# Patient Record
Sex: Female | Born: 1955
Health system: Southern US, Community
[De-identification: ages and names within clinical notes are randomized; demographics above are authoritative.]

## PROBLEM LIST (undated history)

## (undated) DIAGNOSIS — F32A Depression, unspecified: Secondary | ICD-10-CM

## (undated) DIAGNOSIS — R000 Tachycardia, unspecified: Secondary | ICD-10-CM

## (undated) DIAGNOSIS — F329 Major depressive disorder, single episode, unspecified: Secondary | ICD-10-CM

## (undated) DIAGNOSIS — M87052 Idiopathic aseptic necrosis of left femur: Secondary | ICD-10-CM

## (undated) DIAGNOSIS — R42 Dizziness and giddiness: Secondary | ICD-10-CM

## (undated) DIAGNOSIS — M1990 Unspecified osteoarthritis, unspecified site: Secondary | ICD-10-CM

## (undated) DIAGNOSIS — F419 Anxiety disorder, unspecified: Secondary | ICD-10-CM

## (undated) DIAGNOSIS — I10 Essential (primary) hypertension: Secondary | ICD-10-CM

## (undated) DIAGNOSIS — E785 Hyperlipidemia, unspecified: Secondary | ICD-10-CM

## (undated) DIAGNOSIS — E119 Type 2 diabetes mellitus without complications: Secondary | ICD-10-CM

## (undated) DIAGNOSIS — E669 Obesity, unspecified: Secondary | ICD-10-CM

## (undated) DIAGNOSIS — S32009A Unspecified fracture of unspecified lumbar vertebra, initial encounter for closed fracture: Secondary | ICD-10-CM

## (undated) DIAGNOSIS — H3320 Serous retinal detachment, unspecified eye: Secondary | ICD-10-CM

## (undated) DIAGNOSIS — H5440 Blindness, one eye, unspecified eye: Secondary | ICD-10-CM

## (undated) DIAGNOSIS — R51 Headache: Secondary | ICD-10-CM

## (undated) DIAGNOSIS — G501 Atypical facial pain: Principal | ICD-10-CM

## (undated) DIAGNOSIS — K219 Gastro-esophageal reflux disease without esophagitis: Secondary | ICD-10-CM

## (undated) HISTORY — DX: Major depressive disorder, single episode, unspecified: F32.9

## (undated) HISTORY — DX: Obesity, unspecified: E66.9

## (undated) HISTORY — DX: Depression, unspecified: F32.A

## (undated) HISTORY — PX: THUMB FUSION: SUR636

## (undated) HISTORY — DX: Atypical facial pain: G50.1

## (undated) HISTORY — DX: Type 2 diabetes mellitus without complications: E11.9

## (undated) HISTORY — PX: TONSILLECTOMY: SUR1361

## (undated) HISTORY — DX: Tachycardia, unspecified: R00.0

## (undated) HISTORY — PX: EYE SURGERY: SHX253

## (undated) HISTORY — PX: ABDOMINAL HYSTERECTOMY: SHX81

## (undated) HISTORY — DX: Dizziness and giddiness: R42

---

## 1997-07-14 ENCOUNTER — Encounter: Admission: RE | Admit: 1997-07-14 | Discharge: 1997-07-14 | Payer: Self-pay | Admitting: Internal Medicine

## 1997-08-24 ENCOUNTER — Encounter: Admission: RE | Admit: 1997-08-24 | Discharge: 1997-08-24 | Payer: Self-pay | Admitting: Hematology and Oncology

## 1997-10-29 ENCOUNTER — Encounter: Admission: RE | Admit: 1997-10-29 | Discharge: 1997-10-29 | Payer: Self-pay | Admitting: Internal Medicine

## 1997-11-23 ENCOUNTER — Inpatient Hospital Stay (HOSPITAL_COMMUNITY): Admission: AD | Admit: 1997-11-23 | Discharge: 1997-11-23 | Payer: Self-pay | Admitting: *Deleted

## 1997-11-25 ENCOUNTER — Inpatient Hospital Stay (HOSPITAL_COMMUNITY): Admission: RE | Admit: 1997-11-25 | Discharge: 1997-11-25 | Payer: Self-pay | Admitting: Obstetrics

## 1997-12-09 ENCOUNTER — Encounter: Admission: RE | Admit: 1997-12-09 | Discharge: 1997-12-09 | Payer: Self-pay | Admitting: Obstetrics

## 1997-12-30 ENCOUNTER — Encounter: Admission: RE | Admit: 1997-12-30 | Discharge: 1997-12-30 | Payer: Self-pay | Admitting: Hematology and Oncology

## 1999-01-24 ENCOUNTER — Emergency Department (HOSPITAL_COMMUNITY): Admission: EM | Admit: 1999-01-24 | Discharge: 1999-01-24 | Payer: Self-pay | Admitting: Emergency Medicine

## 1999-06-25 ENCOUNTER — Emergency Department (HOSPITAL_COMMUNITY): Admission: EM | Admit: 1999-06-25 | Discharge: 1999-06-26 | Payer: Self-pay | Admitting: Emergency Medicine

## 1999-06-25 ENCOUNTER — Encounter: Payer: Self-pay | Admitting: Emergency Medicine

## 2000-01-31 ENCOUNTER — Encounter: Payer: Self-pay | Admitting: Family Medicine

## 2000-01-31 ENCOUNTER — Encounter: Admission: RE | Admit: 2000-01-31 | Discharge: 2000-01-31 | Payer: Self-pay | Admitting: Family Medicine

## 2000-02-05 ENCOUNTER — Emergency Department (HOSPITAL_COMMUNITY): Admission: EM | Admit: 2000-02-05 | Discharge: 2000-02-05 | Payer: Self-pay | Admitting: Emergency Medicine

## 2000-02-05 ENCOUNTER — Encounter: Payer: Self-pay | Admitting: Emergency Medicine

## 2000-02-20 HISTORY — PX: OTHER SURGICAL HISTORY: SHX169

## 2000-04-15 ENCOUNTER — Emergency Department (HOSPITAL_COMMUNITY): Admission: EM | Admit: 2000-04-15 | Discharge: 2000-04-16 | Payer: Self-pay | Admitting: *Deleted

## 2000-04-22 ENCOUNTER — Emergency Department (HOSPITAL_COMMUNITY): Admission: EM | Admit: 2000-04-22 | Discharge: 2000-04-22 | Payer: Self-pay | Admitting: Emergency Medicine

## 2000-09-30 ENCOUNTER — Inpatient Hospital Stay (HOSPITAL_COMMUNITY): Admission: EM | Admit: 2000-09-30 | Discharge: 2000-10-01 | Payer: Self-pay | Admitting: Emergency Medicine

## 2000-09-30 ENCOUNTER — Encounter: Payer: Self-pay | Admitting: Emergency Medicine

## 2000-10-01 ENCOUNTER — Encounter: Payer: Self-pay | Admitting: Internal Medicine

## 2000-11-12 ENCOUNTER — Ambulatory Visit (HOSPITAL_COMMUNITY): Admission: RE | Admit: 2000-11-12 | Discharge: 2000-11-12 | Payer: Self-pay | Admitting: Gastroenterology

## 2000-11-12 ENCOUNTER — Encounter: Payer: Self-pay | Admitting: Gastroenterology

## 2000-11-21 ENCOUNTER — Ambulatory Visit (HOSPITAL_COMMUNITY): Admission: RE | Admit: 2000-11-21 | Discharge: 2000-11-21 | Payer: Self-pay | Admitting: Gastroenterology

## 2001-05-27 ENCOUNTER — Encounter: Payer: Self-pay | Admitting: Family Medicine

## 2001-05-27 ENCOUNTER — Encounter: Admission: RE | Admit: 2001-05-27 | Discharge: 2001-05-27 | Payer: Self-pay | Admitting: Family Medicine

## 2003-07-18 ENCOUNTER — Emergency Department (HOSPITAL_COMMUNITY): Admission: EM | Admit: 2003-07-18 | Discharge: 2003-07-18 | Payer: Self-pay | Admitting: Emergency Medicine

## 2004-02-19 ENCOUNTER — Emergency Department (HOSPITAL_COMMUNITY): Admission: EM | Admit: 2004-02-19 | Discharge: 2004-02-19 | Payer: Self-pay | Admitting: Emergency Medicine

## 2004-06-13 ENCOUNTER — Other Ambulatory Visit: Admission: RE | Admit: 2004-06-13 | Discharge: 2004-06-13 | Payer: Self-pay | Admitting: Family Medicine

## 2006-02-13 ENCOUNTER — Emergency Department (HOSPITAL_COMMUNITY): Admission: EM | Admit: 2006-02-13 | Discharge: 2006-02-13 | Payer: Self-pay | Admitting: Emergency Medicine

## 2006-09-23 ENCOUNTER — Ambulatory Visit (HOSPITAL_COMMUNITY): Admission: RE | Admit: 2006-09-23 | Discharge: 2006-09-23 | Payer: Self-pay | Admitting: Ophthalmology

## 2008-04-05 ENCOUNTER — Emergency Department (HOSPITAL_BASED_OUTPATIENT_CLINIC_OR_DEPARTMENT_OTHER): Admission: EM | Admit: 2008-04-05 | Discharge: 2008-04-05 | Payer: Self-pay | Admitting: Emergency Medicine

## 2008-04-05 ENCOUNTER — Ambulatory Visit: Payer: Self-pay | Admitting: Radiology

## 2008-07-27 ENCOUNTER — Other Ambulatory Visit: Admission: RE | Admit: 2008-07-27 | Discharge: 2008-07-27 | Payer: Self-pay | Admitting: Family Medicine

## 2008-08-30 ENCOUNTER — Encounter: Admission: RE | Admit: 2008-08-30 | Discharge: 2008-08-30 | Payer: Self-pay | Admitting: Family Medicine

## 2008-08-31 ENCOUNTER — Encounter: Admission: RE | Admit: 2008-08-31 | Discharge: 2008-08-31 | Payer: Self-pay | Admitting: Family Medicine

## 2009-01-17 ENCOUNTER — Emergency Department (HOSPITAL_COMMUNITY): Admission: EM | Admit: 2009-01-17 | Discharge: 2009-01-17 | Payer: Self-pay | Admitting: Emergency Medicine

## 2010-03-12 ENCOUNTER — Encounter: Payer: Self-pay | Admitting: Family Medicine

## 2010-03-13 ENCOUNTER — Encounter: Payer: Self-pay | Admitting: Family Medicine

## 2010-03-16 ENCOUNTER — Emergency Department (HOSPITAL_COMMUNITY)
Admission: EM | Admit: 2010-03-16 | Discharge: 2010-03-16 | Payer: Self-pay | Source: Home / Self Care | Admitting: Emergency Medicine

## 2010-03-16 LAB — BASIC METABOLIC PANEL
BUN: 11 mg/dL (ref 6–23)
CO2: 24 mEq/L (ref 19–32)
Chloride: 105 mEq/L (ref 96–112)
Creatinine, Ser: 0.96 mg/dL (ref 0.4–1.2)
GFR calc Af Amer: >60 mL/min (ref >60)
GFR calc non Af Amer: >60 mL/min (ref >60)
Glucose, Bld: 118 mg/dL — ABNORMAL HIGH (ref 70–99)

## 2010-03-16 LAB — CBC
HCT: 31.5 % — ABNORMAL LOW (ref 36.0–46.0)
Hemoglobin: 10.1 g/dL — ABNORMAL LOW (ref 12.0–15.0)
MCH: 28 pg (ref 26.0–34.0)
MCV: 87.3 fL (ref 78.0–100.0)
Platelets: 248 10*3/uL (ref 150–400)
RBC: 3.61 MIL/uL — ABNORMAL LOW (ref 3.87–5.11)

## 2010-03-16 LAB — DIFFERENTIAL
Basophils Absolute: 0 10*3/uL (ref 0.0–0.1)
Basophils Relative: 0 % (ref 0–1)
Eosinophils Absolute: 0.1 10*3/uL (ref 0.0–0.7)
Lymphocytes Relative: 25 % (ref 12–46)

## 2010-03-16 LAB — POCT CARDIAC MARKERS
CKMB, poc: 1.6 ng/mL (ref 1.0–8.0)
Myoglobin, poc: 115 ng/mL (ref 12–200)
Troponin i, poc: <0.05 ng/mL (ref 0.00–0.09)

## 2010-05-16 ENCOUNTER — Encounter (HOSPITAL_BASED_OUTPATIENT_CLINIC_OR_DEPARTMENT_OTHER)
Admission: RE | Admit: 2010-05-16 | Discharge: 2010-05-16 | Disposition: A | Discharge status: Home / Self Care | Payer: BC Managed Care – PPO | Source: Ambulatory Visit | Attending: Orthopedic Surgery | Admitting: Orthopedic Surgery

## 2010-05-16 LAB — BASIC METABOLIC PANEL
Calcium: 9.6 mg/dL (ref 8.4–10.5)
GFR calc Af Amer: >60 mL/min (ref >60)

## 2010-05-18 ENCOUNTER — Ambulatory Visit (HOSPITAL_BASED_OUTPATIENT_CLINIC_OR_DEPARTMENT_OTHER)
Admission: RE | Admit: 2010-05-18 | Discharge: 2010-05-18 | Disposition: A | Discharge status: Home / Self Care | Payer: BC Managed Care – PPO | Source: Ambulatory Visit | Attending: Orthopedic Surgery | Admitting: Orthopedic Surgery

## 2010-05-18 DIAGNOSIS — M24119 Other articular cartilage disorders, unspecified shoulder: Secondary | ICD-10-CM | POA: Insufficient documentation

## 2010-05-18 DIAGNOSIS — Z01812 Encounter for preprocedural laboratory examination: Secondary | ICD-10-CM | POA: Insufficient documentation

## 2010-05-18 DIAGNOSIS — I10 Essential (primary) hypertension: Secondary | ICD-10-CM | POA: Insufficient documentation

## 2010-05-18 DIAGNOSIS — M898X9 Other specified disorders of bone, unspecified site: Secondary | ICD-10-CM | POA: Insufficient documentation

## 2010-05-18 DIAGNOSIS — M25819 Other specified joint disorders, unspecified shoulder: Secondary | ICD-10-CM | POA: Insufficient documentation

## 2010-05-18 DIAGNOSIS — E669 Obesity, unspecified: Secondary | ICD-10-CM | POA: Insufficient documentation

## 2010-05-18 LAB — POCT HEMOGLOBIN-HEMACUE: Hemoglobin: 14.2 g/dL (ref 12.0–15.0)

## 2010-06-06 LAB — BASIC METABOLIC PANEL
CO2: 27 mEq/L (ref 19–32)
Chloride: 104 mEq/L (ref 96–112)
GFR calc Af Amer: >60 mL/min (ref >60)
Glucose, Bld: 110 mg/dL — ABNORMAL HIGH (ref 70–99)

## 2010-06-06 LAB — CBC
HCT: 36.3 % (ref 36.0–46.0)
Hemoglobin: 12.2 g/dL (ref 12.0–15.0)
MCV: 87.8 fL (ref 78.0–100.0)
Platelets: 245 10*3/uL (ref 150–400)
RBC: 4.14 MIL/uL (ref 3.87–5.11)
RDW: 12.2 % (ref 11.5–15.5)

## 2010-06-06 LAB — DIFFERENTIAL
Basophils Absolute: 0.1 10*3/uL (ref 0.0–0.1)
Basophils Relative: 1 % (ref 0–1)
Eosinophils Absolute: 0.1 10*3/uL (ref 0.0–0.7)
Eosinophils Relative: 2 % (ref 0–5)
Lymphs Abs: 1.6 10*3/uL (ref 0.7–4.0)

## 2010-06-06 LAB — POCT CARDIAC MARKERS: Troponin i, poc: <0.05 ng/mL (ref 0.00–0.09)

## 2010-06-08 NOTE — Op Note (Signed)
NAME:  Summer [name], Summer [name] NO.:  [id number]  MEDICAL RECORD NO.:  [id number]           PATIENT TYPE:  LOCATION:                                 FACILITY:  PHYSICIAN:  Jones Broom, MD    DATE OF BIRTH:  03-Aug-1955  DATE OF PROCEDURE:  05/18/2010 DATE OF DISCHARGE:                              OPERATIVE REPORT   PREOPERATIVE DIAGNOSIS:  Right shoulder rotator cuff tear and impingement with question of possible superior labral tear.  POSTOPERATIVE DIAGNOSES: 1. Right shoulder supraspinatus tear. 2. Right shoulder impingement with large anterior acromial spur. 3. Type 1 tear of the superior labrum right shoulder.  PROCEDURES PERFORMED: 1. Right shoulder arthroscopic rotator cuff repair. 2. Right shoulder subacromial decompression. 3. Right shoulder debridement of superior labral tear.  ATTENDING SURGEON:  Jones Broom, MD  ASSISTANT:  None.  ANESTHESIA:  GETA with preoperative interscalene block.  COMPLICATIONS:  None.  DRAINS:  None.  SPECIMENS:  None.  ESTIMATED BLOOD LOSS:  Minimal.  INDICATIONS FOR SURGERY:  The patient is a 55 year old female who had a history of right shoulder pain, which began during her rehab for previous right hand injury.  She had an injection, which temporarily relieved her symptoms, but went on to have continued severe right shoulder pain limiting her daily activities and her sleep at night.  MRI revealed a rotator cuff tear.  We talked about risks, benefits, and alternatives to surgical versus nonsurgical management of the rotator cuff tear.  She elected to go forward with the surgery to try and prevent increase in the tear size and to decrease her symptoms and pain. There were some question as to the initial cause of rotator cuff tear. It is my opinion that she had a rotator cuff tear prior to her participation in physical therapy, which became significantly more symptomatic with her therapy.  I think it is  extremely unlikely that her physical therapy actually caused a tear.  Nevertheless, she wished to go forward with surgery understanding risks, benefits, and alternatives of surgery including, but not limited to risk of bleeding, infection, damage to neurovascular structures and stiffness, nonhealing, and incomplete pain relief.  OPERATIVE FINDINGS:  Examination under anesthesia demonstrates full range of motion without any instability.  Diagnostic arthroscopy revealed some extensive fraying of the superior labrum, but no detachment of the biceps root.  This was debrided back to a stable base. The biceps tendon was fairly healthy appearing.  There were some mild longitudinal striations with no full tearing of the tendon, it was not felt that a tenotomy was necessary.  She did not have any loose bodies. The cartilaginous surfaces were intact with no significant arthritis. Posterior rotator cuff was intact.  The superior rotator cuff, she was noted to have a tear of the supraspinatus, which was minimally retracted.  Tear measured approximately 1.5 cm anterior to posterior. It was repaired down to a prepared tuberosity using two 5.5 mm BioComposite corkscrew anchors with simple single row pattern.  No undue tension was noted on the repair.  She was noted to have an extremely large anterior acromial spur.  The coracoacromial ligament  was taken down and the anterior acromial spur was taken down.  At the conclusion, the acromion was turned to a type 1 acromion and completely flat from posterior to anterior with significant increase in the space with rotator cuff.  PROCEDURE:  The patient was identified in the preoperative holding area where I personally marked the operative site after verifying site, side, and procedure with the patient.  She had an interscalene block given by the attending anesthesiologist, which was felt to be successful.  She was taken back to the operating room where  general anesthesia was induced without complication.  She had preoperative antibiotics.  The right upper extremity was prepped and draped in a standard sterile fashion.  Appropriate time-out procedure was carried out by myself, the operative staff, and the anesthesia staff all verifying site, side, and procedure.  A standard posterior portal was established and the arthroscope was introduced into the joint.  The spinal needle was then used to establish anterior portal under direct visualization above the subscapularis.  Diagnostic arthroscopy was then carried out with findings as described above.  There were some mild partial tearing at the upper border of the subscapularis, which was debrided, but not felt to be necessary for formal repair.  She did have extensive fraying and partial tearing of the superior labrum, which was debrided back to a stable base, the biceps grove was intact.  Biceps tendon was pulled into the joint and noted to have some longitudinal striations, but no tearing that would necessitate formal tenotomy or tenodesis.  Joint surfaces were carefully examined and found to be completely intact.  No loosebodies were noted.  Posterior rotator cuff intact.  Supraspinatus was noted to have a tear from the undersurface.  The arthroscope was then introduced into the subacromial space.  There was noted to be significant fraying of the coracoacromial ligament.  Bursectomy was carried out and the underlying rotator cuff was carefully examined and probed.  She was noted to have approximately 1.5-cm tear anterior to posterior.  Tear extended from the biceps tendon posteriorly.  A lateral portal was established with needle localization and the camera was moved to a posterolateral portal for better visualization.  A large cannula was placed laterally.  Tuberosity was prepared to a bleeding surface with bur and the tendon edge was freshened with a shaver to promote healing.  Grasper  was used to reduce the tendon and it was noted that there was no undue tension.  Therefore, one 5.5-mm BioComposite corkscrew anchor was placed percutaneously in a posterior position.  The sutures were passed in a simple suture configuration.  The tendon was held reduced while the knots were tied.  After this first anchor, it was felt that an additional anchor would be appropriate just posterior to the biceps tendon to bring down the remaining portion of the tendon.  It was placed in the same fashion and again a simple suture configuration was used to bring the tendon nicely down to the prepared tuberosity under no undue tension.  The repair was viewed from posterior and lateral portals and felt to be adequate.  The posterior aspect of the rotator cuff was then carefully examined and probed given the finding of possible calcium deposit on her MRI.  I was not able to find the calcium deposit after careful probing with a spinal needle to see if there are any jockey calcifications.  I did not feel that it was worthwhile dissecting through her rotator cuff any further to try  and find this. The coracoacromial ligament taken down and the large anterior acromial spur was then taken off lateral to medial with a standard 4-mm bur.  The resection was then viewed from the posterior portal and felt to be adequate with no significant residual anterior acromial spur.  The undersurface of the acromion was very smooth posterior to anterior. Collene Mares was used in the joint to remove excess bone dust and then the arthroscope was removed and the portals were closed with 3-0 nylon in an interrupted fashion.  Sterile dressings were applied including Xeroform, 4x4s, ABDs, and tape.  The patient was placed in a sling, allowed to awaken from general anesthesia, transferred to the stretcher, and taken to the recovery room in a stable condition.  POSTOPERATIVE PLAN:  Summer [name] will be discharged home today with  her family.  She will follow up in 1 week for suture removal and wound check.  She will remain on her sling until that time.  She will have Percocet for pain control.     Jones Broom, MD     JC/MEDQ  D:  05/18/2010  T:  05/19/2010  Job:  [id number]  Electronically Signed by Jones Broom  on 06/08/2010 03:02:18 PM

## 2010-07-04 NOTE — Op Note (Signed)
NAME:  Summer [name], Summer [name]              ACCOUNT NO.:  [id number]   MEDICAL RECORD NO.:  [id number]          PATIENT TYPE:  AMB   LOCATION:  SDS                          FACILITY:  [facility]   PHYSICIAN:  Lanna Poche, M.D. DATE OF BIRTH:  February 12, 1956   DATE OF PROCEDURE:  09/23/2006  DATE OF DISCHARGE:                               OPERATIVE REPORT   PREOPERATIVE DIAGNOSIS:  Rhegmatogenous retinal detachment right eye.   POSTOPERATIVE DIAGNOSIS:  Rhegmatogenous retinal detachment right eye.   PROCEDURE:  Pars vitrectomy, drainage of subretinal fluid, laser for  retinal break and peripheral laser photocoagulation at 16% C3F8, right  eye.   SURGEON:  Lanna Poche, M.D.   ASSISTANT:  Bryan Lemma. Lundquist, P.A.   ANESTHESIA:  General endotracheal.   ESTIMATED BLOOD LOSS:  Less than 1 mL.   COMPLICATIONS:  None.   OPERATIVE NOTE:  The patient was taken to the operating room and after  induction of general anesthesia, the right eye was prepped in the usual  fashion.  A lid speculum was introduced and the conjunctiva was opened  temporally and superonasally.  Hemostasis was obtained, with cautery and  sclerotomies were fashioned 3 mm to the limbus at 1:30, 10:30 and 7:30.  The supraspinous were plugged and a 4 mm infusion cannula secured at  7:30 with temporary sutures of  7-0 Vicryl.  The tip was visually  inspected and found to be in good position.  A Landers ring was secured  to the globe with 7-0 Vicryl sutures at 3 and 9.  Plugs were removed and  30 degree prismatic lens was applied to the surface of the eye. Using a  25-gauge vitrector, the vitreous was gradually removed.  It was somewhat  thick and this took a considerable amount of time.  The __________ was  used to the trim the vitreous both __________  retina as well over the  area of detached retina.  A __________ atrophic break was seen at  approximately 3 o'clock.  A small anatomic break was made at  approximately 1:30  medially and posterior to the ora serrata __________  vitreous base.  The vitrector was used to aspirate the subretinal fluid  through this hole.  Very thick subretinal and somewhat turbid fluid was  removed without difficulty.  The instruments were removed from the eye  and the holes were plugged.  The Landers lens and ring were removed.  The speculum and ophthalmoscope scleral pressure revealed there to be no  additional retinal breaks or tears.  The indirect laser was used to  __________  on the attached retina and then __________  posterior pole  with egress of a small amount of remaining subretinal fluid through the  open break at approximately 1:30.  Laser photocoagulation was then  applied around the retinal breaks as well as around the small pin point  hole inferior to this.  Good retinal whitening was achieved and  additional retinal photocoagulation was placed for approximately 3-4  rows posterior to the ora serrata at 360 degrees.  Attention was then  directed back to the eye where  the __________  was removed and gas  exchange performed.  The superotemporal sclerotomy was closed with 7-0  Vicryl.  The supranasal sclerotomy was left open and 20 mL of gas  mixture infused with the infusion cannula with egress of the remaining  sclerotomy.  This sclerotomy was then closed and the fusion cannula  removed, previously placed sutures secured.  The pressure was adjusted  to 21 mmHg by additional gas injection with a 30-gauge needle, 3 mL plus  limbus at 10 o'clock.  The conjunctiva was then dropped, reapproximated  with running suture of 6-0 plain gut.  The subconjunctival space was  irrigated with 0.75% Marcaine followed by subconjunctival injection of  100 mg of ceftazidime and , 10 mg of Decadron.  The lid speculum was  then removed and mixed antibiotic ointment was applied to the surface of  the eye. An eye patch and shields were then placed on the patient's eye.  Upon waking from  anesthesia, the patient left the operating room in  stable condition.           ______________________________  Lanna Poche, M.D.     JTH/MEDQ  D:  09/23/2006  T:  09/24/2006  Job:  [id number]

## 2010-07-07 NOTE — H&P (Signed)
[place]. [facility]  Patient:    Summer, [name]                     MRN: [id number] Adm. Date:  [id number] Attending:  Cathren Laine CC:         Gretta Arab. Valentina Lucks, M.D. - [facility]   History and Physical  DATE OF BIRTH:  11/05/1955  PROBLEM LIST: 1. Chest pain, rule out myocardial infarction. 2. Obesity. 3. Status post total abdominal hysterectomy with bilateral    salpingo-oophorectomy in 1982:    a. On hormonal replacement therapy.  CHIEF COMPLAINT:  Pain.  HISTORY OF PRESENT ILLNESS:  Summer [name] is a very pleasant 55 year old female who presents with a three-month history of recurrent chest pain.  The patient describes mostly substernal chest pain when she wakes up in the mornings, and before she starts ambulating.  This pain is described as a dual pain that tends to last between 20-40 minutes.  She also describes occasional chest pain during the day that takes place about one episode every two weeks.  These episodes of chest pain are not related to exertion.  She denies lower extremity swelling.  No shortness of breath.  This morning the patient had another episode of substernal chest pain radiating to the left arm, and bilateral upper extremity numbness while she was sitting up at work.  No syncope.  The patient also describes some symptoms of fluttering.  No dyspnea on exertion, no orthopnea.  No cough, no fever, no chills, no nausea, no vomiting.  Once again, this type of chest symptoms tend to last for about 30 minutes.  No hemoptysis, no hematemesis, no melena, no tarry stools, no bright red blood per rectum.  The patient tends to live a sedentary life.  She does not take long trips either by car or by plane.  The patient denies hypertension, diabetes mellitus, or smoking.  The last lipid profile was done about one year ago.  The patient does not know the results, though her primary care Summer [name] did not recommend any  type of diet change.  In her family, her sister, father, and mother have had acute heart attacks.  See the family medical history section for further details.  Once again, the patients chest symptoms are clearly not related to exercise.  The patient denies any history of heartburn.  PAST MEDICAL HISTORY:  As in the problem list.  ALLERGIES:  No known drug allergies.  CURRENT MEDICATIONS:  Premarin 0.625 mg p.o. q.d.  FAMILY HISTORY:  The patients mother had an acute myocardial infarction at age 50.  Her father also had a myocardial infarction, though she does not know the age when this happened.  Her sister had a myocardial infarction, requiring a quadruple bypass surgery at age 56.  Her mother had hypertension, and her father had diabetes mellitus.  No malignancy or strokes in the family.  SOCIAL HISTORY:  The patient is divorced.  She has one child.  She works as a Pensions consultant.  No smoking, no alcohol use.  REVIEW OF SYSTEMS:  As in the HPI.  No vaginal discharge.  No abdominal symptoms.  No urinary symptoms.  No lower back pain.  PHYSICAL EXAMINATION:  VITAL SIGNS:  Temperature 98.4 degrees, blood pressure 135/73, heart rate 88, respirations 20.  Oxygen saturation 99% on room air.  HEENT:  Normocephalic, atraumatic.  Anicteric sclerae.  Conjunctivae within normal limits.  PERRLA.  EOMI.  Funduscopic  examination negative for papilledema or hemorrhages.  Tympanic membranes within normal limits. Oropharynx clear.  NECK:  Supple.  No jugular venous distention, no bruits, no adenopathy, no thyromegaly.  LUNGS:  Clear to auscultation bilaterally without crackles or wheezes.  Fair air movement bilaterally.  CARDIAC:  A regular rate and rhythm without murmurs, rubs, or gallops.  Normal S1, S2.  ABDOMEN:  Slightly obese, nontender, nondistended.  Bowel sounds were present. No hepatosplenomegaly.  No rebound, no guarding, no masses, no bruits.  GENITOURINARY:  Within normal  limits.  BREASTS:  Within normal limits.  RECTAL:  Not done.  EXTREMITIES:  No cyanosis, clubbing, or edema.  Pulses 2+ bilaterally.  NEUROLOGIC:  Alert and oriented x 3.  Strength 5/5 in all extremities.  Deep tendon reflexes 3/5 in all extremities.  Cranial nerves II-XII intact. Sensory intact.  Plantar reflexes downgoing bilaterally.  LABORATORY DATA:  Chest x-ray:  No active disease.  Slight cardiomegaly.  Electrocardiogram:  Normal sinus rhythm with a heart rate of 78.  Normal axis. There is a Q-wave in lead III.  There is a T-wave inversion in V1, V2, and V3. There is early R-wave progression in the precordial leads V2 through V4. There is not an old electrocardiogram to compare with.  CPK is 193, CPK-MB 1.9, troponin I 0.17.  The CBC and the CMET are within normal limits.  ASSESSMENT/PLAN: 1. Atypical chest pain, rule out myocardial infarction:  The patients    chest pain symptoms are atypical for cardiac source.  She has minimal    cardiac risk factors.  The only cardiac risk factor seems to be at this    point a family medical history, as described above.  The electrocardiogram    although is not normal, shows no evidence of acute ischemia.  The CPK-MB    is completely negative, and the troponin I is abnormal, though the    significance of these results remain completely anuclear.  PLAN:  I discussed this case with Dr. Darci Needle III, cardiology.  The plan is to admit her to a telemetry bed.  Cardiac enzymes will be obtained every eight hours.  A stress Cardiolite will be obtained tomorrow morning. For now will use aspirin.  A beta blocker will not be used at this point, nor nitrates.  2. Obesity.  PLAN:  We discussed exercise and diet, to try to decrease her body weight. DD:  09/30/00 TD:  09/30/00 Job: [id number]

## 2010-12-04 LAB — URINALYSIS, ROUTINE W REFLEX MICROSCOPIC
Bilirubin Urine: NEGATIVE
Nitrite: NEGATIVE
Specific Gravity, Urine: 1.02
Urobilinogen, UA: 0.2
pH: 6

## 2010-12-04 LAB — CBC
MCHC: 33.9
WBC: 6.5

## 2010-12-04 LAB — COMPREHENSIVE METABOLIC PANEL
AST: 22
Albumin: 3.4 — ABNORMAL LOW
CO2: 26
Calcium: 9.4
Chloride: 105
Creatinine, Ser: 1.02
GFR calc Af Amer: >60
GFR calc non Af Amer: 58 — ABNORMAL LOW
Glucose, Bld: 104 — ABNORMAL HIGH
Total Bilirubin: 0.9

## 2010-12-04 LAB — URINE MICROSCOPIC-ADD ON

## 2011-03-23 DIAGNOSIS — H15002 Unspecified scleritis, left eye: Secondary | ICD-10-CM | POA: Insufficient documentation

## 2011-03-23 DIAGNOSIS — H3320 Serous retinal detachment, unspecified eye: Secondary | ICD-10-CM | POA: Insufficient documentation

## 2011-06-14 ENCOUNTER — Ambulatory Visit
Admission: RE | Admit: 2011-06-14 | Discharge: 2011-06-14 | Disposition: A | Discharge status: Home / Self Care | Payer: Self-pay | Source: Ambulatory Visit | Attending: Physician Assistant | Admitting: Physician Assistant

## 2011-06-14 ENCOUNTER — Other Ambulatory Visit: Payer: Self-pay | Admitting: Physician Assistant

## 2011-06-14 DIAGNOSIS — W19XXXA Unspecified fall, initial encounter: Secondary | ICD-10-CM

## 2011-06-14 DIAGNOSIS — M549 Dorsalgia, unspecified: Secondary | ICD-10-CM

## 2011-06-28 DIAGNOSIS — IMO0002 Reserved for concepts with insufficient information to code with codable children: Secondary | ICD-10-CM | POA: Insufficient documentation

## 2011-06-28 DIAGNOSIS — R12 Heartburn: Secondary | ICD-10-CM | POA: Insufficient documentation

## 2011-06-28 DIAGNOSIS — F419 Anxiety disorder, unspecified: Secondary | ICD-10-CM | POA: Insufficient documentation

## 2011-06-29 DIAGNOSIS — Z9889 Other specified postprocedural states: Secondary | ICD-10-CM | POA: Insufficient documentation

## 2012-03-01 ENCOUNTER — Inpatient Hospital Stay (HOSPITAL_COMMUNITY)
Admission: EM | Admit: 2012-03-01 | Discharge: 2012-03-03 | DRG: 103 | Disposition: A | Discharge status: Home / Self Care | Payer: Medicaid Other | Attending: Internal Medicine | Admitting: Internal Medicine

## 2012-03-01 ENCOUNTER — Emergency Department (HOSPITAL_COMMUNITY): Payer: Self-pay

## 2012-03-01 ENCOUNTER — Encounter (HOSPITAL_COMMUNITY): Payer: Self-pay | Admitting: Nurse Practitioner

## 2012-03-01 DIAGNOSIS — E785 Hyperlipidemia, unspecified: Secondary | ICD-10-CM | POA: Diagnosis present

## 2012-03-01 DIAGNOSIS — H3320 Serous retinal detachment, unspecified eye: Secondary | ICD-10-CM | POA: Diagnosis present

## 2012-03-01 DIAGNOSIS — R000 Tachycardia, unspecified: Secondary | ICD-10-CM | POA: Diagnosis present

## 2012-03-01 DIAGNOSIS — Z79899 Other long term (current) drug therapy: Secondary | ICD-10-CM

## 2012-03-01 DIAGNOSIS — Z23 Encounter for immunization: Secondary | ICD-10-CM

## 2012-03-01 DIAGNOSIS — R519 Headache, unspecified: Secondary | ICD-10-CM | POA: Diagnosis present

## 2012-03-01 DIAGNOSIS — H5440 Blindness, one eye, unspecified eye: Secondary | ICD-10-CM | POA: Diagnosis present

## 2012-03-01 DIAGNOSIS — R51 Headache: Principal | ICD-10-CM | POA: Diagnosis present

## 2012-03-01 DIAGNOSIS — I10 Essential (primary) hypertension: Secondary | ICD-10-CM | POA: Diagnosis present

## 2012-03-01 DIAGNOSIS — R209 Unspecified disturbances of skin sensation: Secondary | ICD-10-CM | POA: Diagnosis present

## 2012-03-01 HISTORY — DX: Serous retinal detachment, unspecified eye: H33.20

## 2012-03-01 HISTORY — DX: Essential (primary) hypertension: I10

## 2012-03-01 HISTORY — DX: Hyperlipidemia, unspecified: E78.5

## 2012-03-01 LAB — CBC WITH DIFFERENTIAL/PLATELET
Basophils Absolute: 0 10*3/uL (ref 0.0–0.1)
Eosinophils Relative: 2 % (ref 0–5)
HCT: 39.5 % (ref 36.0–46.0)
Hemoglobin: 13 g/dL (ref 12.0–15.0)
Lymphocytes Relative: 24 % (ref 12–46)
Lymphs Abs: 1.5 10*3/uL (ref 0.7–4.0)
MCV: 86.1 fL (ref 78.0–100.0)
Monocytes Absolute: 0.6 10*3/uL (ref 0.1–1.0)
Monocytes Relative: 9 % (ref 3–12)
Neutro Abs: 4.2 10*3/uL (ref 1.7–7.7)
RBC: 4.59 MIL/uL (ref 3.87–5.11)
WBC: 6.5 10*3/uL (ref 4.0–10.5)

## 2012-03-01 LAB — COMPREHENSIVE METABOLIC PANEL
AST: 23 U/L (ref 0–37)
BUN: 14 mg/dL (ref 6–23)
CO2: 22 mEq/L (ref 19–32)
Chloride: 102 mEq/L (ref 96–112)
Creatinine, Ser: 0.98 mg/dL (ref 0.50–1.10)
GFR calc Af Amer: 74 mL/min — ABNORMAL LOW (ref >90)
GFR calc non Af Amer: 64 mL/min — ABNORMAL LOW (ref >90)
Glucose, Bld: 107 mg/dL — ABNORMAL HIGH (ref 70–99)
Total Bilirubin: 0.7 mg/dL (ref 0.3–1.2)

## 2012-03-01 LAB — GLUCOSE, CAPILLARY: Glucose-Capillary: 159 mg/dL — ABNORMAL HIGH (ref 70–99)

## 2012-03-01 LAB — C-REACTIVE PROTEIN: CRP: 1 mg/dL — ABNORMAL HIGH (ref <0.60)

## 2012-03-01 MED ORDER — ONDANSETRON HCL 4 MG/2ML IJ SOLN: 4.0000 mg | Freq: Four times a day (QID) | INTRAMUSCULAR | Status: DC | PRN

## 2012-03-01 MED ORDER — METHYLPREDNISOLONE SODIUM SUCC 125 MG IJ SOLR
100.0000 mg | Freq: Once | INTRAMUSCULAR | Status: AC
  Administered 2012-03-01: 100 mg via INTRAVENOUS
  Filled 2012-03-01: qty 2

## 2012-03-01 MED ORDER — LISINOPRIL 20 MG PO TABS
20.0000 mg | ORAL_TABLET | Freq: Every day | ORAL | Status: DC
  Administered 2012-03-01 – 2012-03-03 (×3): 20 mg via ORAL
  Filled 2012-03-01 (×3): qty 1

## 2012-03-01 MED ORDER — HYDROCODONE-ACETAMINOPHEN 5-325 MG PO TABS
1.0000 | ORAL_TABLET | Freq: Four times a day (QID) | ORAL | Status: DC | PRN
  Administered 2012-03-01 – 2012-03-02 (×2): 1 via ORAL
  Filled 2012-03-01 (×2): qty 1

## 2012-03-01 MED ORDER — ACETAMINOPHEN 325 MG PO TABS
650.0000 mg | ORAL_TABLET | Freq: Four times a day (QID) | ORAL | Status: DC | PRN
  Administered 2012-03-02 – 2012-03-03 (×2): 650 mg via ORAL
  Filled 2012-03-01 (×2): qty 2

## 2012-03-01 MED ORDER — SIMVASTATIN 20 MG PO TABS
20.0000 mg | ORAL_TABLET | Freq: Every day | ORAL | Status: DC
  Administered 2012-03-01 – 2012-03-02 (×2): 20 mg via ORAL
  Filled 2012-03-01 (×3): qty 1

## 2012-03-01 MED ORDER — SODIUM CHLORIDE 0.9 % IV BOLUS (SEPSIS)
500.0000 mL | Freq: Once | INTRAVENOUS | Status: AC
  Administered 2012-03-01: 500 mL via INTRAVENOUS

## 2012-03-01 MED ORDER — SODIUM CHLORIDE 0.9 % IV SOLN
1000.0000 mg | Freq: Every day | INTRAVENOUS | Status: DC
  Administered 2012-03-02 – 2012-03-03 (×2): 1000 mg via INTRAVENOUS
  Filled 2012-03-01 (×3): qty 8

## 2012-03-01 MED ORDER — ENOXAPARIN SODIUM 40 MG/0.4ML ~~LOC~~ SOLN
40.0000 mg | SUBCUTANEOUS | Status: DC
  Administered 2012-03-01 – 2012-03-02 (×2): 40 mg via SUBCUTANEOUS
  Filled 2012-03-01 (×3): qty 0.4

## 2012-03-01 MED ORDER — INFLUENZA VIRUS VACC SPLIT PF IM SUSP
0.5000 mL | INTRAMUSCULAR | Status: AC
  Administered 2012-03-02: 0.5 mL via INTRAMUSCULAR
  Filled 2012-03-01: qty 0.5

## 2012-03-01 MED ORDER — PREDNISONE 1 MG PO TABS: 90.0000 mg | ORAL_TABLET | Freq: Every day | ORAL | Status: DC

## 2012-03-01 MED ORDER — ONDANSETRON HCL 4 MG PO TABS: 4.0000 mg | ORAL_TABLET | Freq: Four times a day (QID) | ORAL | Status: DC | PRN

## 2012-03-01 MED ORDER — ACETAMINOPHEN 650 MG RE SUPP: 650.0000 mg | Freq: Four times a day (QID) | RECTAL | Status: DC | PRN

## 2012-03-01 MED ORDER — SODIUM CHLORIDE 0.9 % IV SOLN
INTRAVENOUS | Status: DC
  Administered 2012-03-01: 19:00:00 via INTRAVENOUS

## 2012-03-01 NOTE — ED Notes (Signed)
Pt refused CT. Pt states she did not want to remove her hair pins for the scan. States based on the information provided to her she no longer believes she had a stroke.

## 2012-03-01 NOTE — ED Notes (Addendum)
Pt reports headache and nausea yesterday then noticed around 3 am she felt the L side of her face was "drawing up" and felt numb. States the symptoms "feel a little better" since onset but remain. Denies numbness or weakness anywhere else, ambulatory, MAE, grips = bilateral, A&Ox4, no slurred speech or facial droop. Reports recent eye surgery and complications from L eye retinal detachment, blindness in L eye since the detachment

## 2012-03-01 NOTE — ED Notes (Signed)
Family at bedside. 

## 2012-03-01 NOTE — H&P (Signed)
Patient's PCP: [name], MD  Chief Complaint: Left-sided headache and numbness.  History of Present Illness: Summer [name] is a 57 y.o. African American female with history of hypertension, hyperlipidemia, retinal detachment with patient being legally blind in left eye and has had a retinal detachment in her right eye but has preserved vision he presents with the above complaints.  Patient noted that she chronically has left eye pain with headaches.  However on 02/25/2012 she noted that her symptoms of headache were more severe.  She noted that she was having flulike symptoms with myalgias but no nasal congestion or cough.  She has recently noted that her vision in her right eye was becoming more blurry as a result she presented to the emergency department for further evaluation.  She had a head CT which was normal and ESR was elevated at 91.  Given patient's presentations there was concern for possible temporal arteritis as a result hospitalist service was asked to admit the patient for further care and management.  Patient has been feeling nauseated but has not vomited.  Has been feeling feverish at home.  Denies any chest pain or shortness of breath.  Denies any abdominal pain or diarrhea.  Review of Systems: All systems reviewed with the patient and positive as per history of present illness, otherwise all other systems are negative.  Past Medical History  Diagnosis Date  . Retinal detachment   . Hypertension   . Hyperlipemia    History reviewed. No pertinent past surgical history. Family History  Problem Relation Age of Onset  . Heart disease Mother   . Heart disease Father   . Diabetes Father    History   Social History  . Marital Status: Divorced    Spouse Name: N/A    Number of Children: N/A  . Years of Education: N/A   Occupational History  . Not on file.   Social History Main Topics  . Smoking status: Never Smoker   . Smokeless tobacco: Not on file  . Alcohol  Use: No  . Drug Use: No  . Sexually Active:    Other Topics Concern  . Not on file   Social History Narrative  . No narrative on file   Allergies: Review of patient's allergies indicates no known allergies.  Home Meds: Prior to Admission medications   Medication Sig Start Date End Date Taking? Authorizing Provider  lisinopril (PRINIVIL,ZESTRIL) 20 MG tablet Take 20 mg by mouth daily.   Yes Historical Provider, MD  pravastatin (PRAVACHOL) 40 MG tablet Take 40 mg by mouth daily.   Yes Historical Provider, MD    Physical Exam: Blood pressure 162/76, pulse 112, temperature 98.1 F (36.7 C), temperature source Oral, resp. rate 26, SpO2 100.00%. General: Awake, Oriented x3, No acute distress. HEENT: EOMI, Moist mucous membranes, diminished vision in the left eye, able to read through the right eye, some tenderness to palpation over the maxilla. Neck: Supple CV: S1 and S2 Lungs: Clear to ascultation bilaterally Abdomen: Soft, Nontender, Nondistended, +bowel sounds. Ext: Good pulses. Trace edema. No clubbing or cyanosis noted. Neuro: Cranial Nerves II-XII grossly intact. Has 5/5 motor strength in upper and lower extremities.  Lab results:  [facility] 03/01/12 1150  NA 136  K 4.2  CL 102  CO2 22  GLUCOSE 107*  BUN 14  CREATININE 0.98  CALCIUM 9.9  MG --  PHOS --    Basename 03/01/12 1150  AST 23  ALT 20  ALKPHOS 121*  BILITOT 0.7  PROT 8.4*  ALBUMIN 3.6   No results found for this basename: LIPASE:2,AMYLASE:2 in the last 72 hours  Basename 03/01/12 1150  WBC 6.5  NEUTROABS 4.2  HGB 13.0  HCT 39.5  MCV 86.1  PLT 244   No results found for this basename: CKTOTAL:3,CKMB:3,CKMBINDEX:3,TROPONINI:3 in the last 72 hours No components found with this basename: POCBNP:3 No results found for this basename: DDIMER in the last 72 hours No results found for this basename: HGBA1C:2 in the last 72 hours No results found for this basename:  CHOL:2,HDL:2,LDLCALC:2,TRIG:2,CHOLHDL:2,LDLDIRECT:2 in the last 72 hours No results found for this basename: TSH,T4TOTAL,FREET3,T3FREE,THYROIDAB in the last 72 hours No results found for this basename: VITAMINB12:2,FOLATE:2,FERRITIN:2,TIBC:2,IRON:2,RETICCTPCT:2 in the last 72 hours Imaging results:  Ct Head Wo Contrast  03/01/2012  *RADIOLOGY REPORT*  Clinical Data: Headache.  Numbness and tingling in the left side of the face.  CT HEAD WITHOUT CONTRAST  Technique:  Contiguous axial images were obtained from the base of the skull through the vertex without contrast.  Comparison: No priors.  Findings: No acute intracranial abnormalities.  Specifically, no definite signs of acute/subacute cerebral ischemia, no evidence of acute intracranial hemorrhage, no mass, mass effect, hydrocephalus or abnormal intra or extra-axial fluid collections.  Visualized paranasal sinuses and mastoids are well pneumatized.  No acute displaced skull fractures are identified.  IMPRESSION: 1.  No acute intracranial abnormalities. 2.  The appearance of the brain is normal.   Original Report Authenticated By: Trudie Reed, M.D.    Other results: EKG: Sinus with HR in the 90s.  Assessment & Plan by Problem: Headache with elevated ESR and flulike/feverish symptoms with elevated ESR Broad differential.  Head CT is negative.  Given there is concern for possible temporal arthritis as a result will start the patient on pulse IV Solu-Medrol with 1 gm daily for 3 days then start prednisone 1 mg per kilogram after on Solu-Medrol (Uptodate recommendations).  Patient does not have any focal neurologic deficit to suggest TIA/stroke, however will get MRI of the brain for further evaluation.  Briefly discussed with Dr. Hart [name], vascular surgery, who indicated that biopsy of the temporal artery could be done as outpatient in the next 1 to 2 weeks.  Patient not endorsing any sore throat, cough, or sinus congestion to suggest an upper respiratory  viral infection.  Patient may also be having a migraine headache.  Check TSH and free T4 in the morning.  As patient will be on steroids check CBGs.  History of retinal detachment with floaters/blurry vision in the right eye (has frequent history of such floaters in the right eye) Discussed with Dr. [name], opthalmology [phone]. Who will evaluate the patient tomorrow.  Hypertension Continue lisinopril.  Hyperlipidemia Continue statin.  Tachycardia Continue to monitor.  Prophylaxis Lovenox.  CODE STATUS Full code.  Disposition Admit the patient to telemetry given tachycardia as observation.  Time spent on admission, talking to the patient, and coordinating care was: 60 mins.  Mathias Bogacki A, MD 03/01/2012, 4:50 PM

## 2012-03-01 NOTE — ED Notes (Signed)
nss attached to  The saline lok med given.  Alert no distress

## 2012-03-01 NOTE — ED Notes (Signed)
Pt states she developed a headache around 1 am and some numbness, tingling, and weakness to the left side of her face. Pt was concerned she may be having a stroke so she came to the ED. No facial droop or slurred speech noted. Pt has sensitivity to light, she states it's due to a recent eye surgery.

## 2012-03-01 NOTE — ED Notes (Signed)
Report called to [id number] will transport after she gets prednisone

## 2012-03-01 NOTE — ED Notes (Signed)
Prednisone not given order is for tomorrow am.  Not sure why it is in the ed screen to be given

## 2012-03-01 NOTE — ED Provider Notes (Signed)
History     CSN: [id number]  Arrival date & time 03/01/12  1030   First MD Initiated Contact with Patient 03/01/12 1109      Chief Complaint  Patient presents with  . Numbness     HPI  The patient presents with new facial pain and numbness.  Symptoms began within the past 12 hours.  Since onset symptoms have been persistent.  Symptoms are focally about the left maxillary prominence, but include the entire left anterior face. Has been no clear alleviating or exacerbating factors. The patient notes that she has a history of retinal attachment on the left, is legally blind in that eye. She has a history of headaches, photophobia as well.  She notes that the headache and for phobia have been present today. She denies any visual acuity changes on the right. There is no new nausea, vomiting, diarrhea, chest pain, dyspnea.   Past Medical History  Diagnosis Date  . Retinal detachment     History reviewed. No pertinent past surgical history.  History reviewed. No pertinent family history.  History  Substance Use Topics  . Smoking status: Never Smoker   . Smokeless tobacco: Not on file  . Alcohol Use: No    OB History    Grav Para Term Preterm Abortions TAB SAB Ect Mult Living                  Review of Systems  Constitutional:       Per HPI, otherwise negative  HENT:       Per HPI, otherwise negative  Eyes: Negative.   Respiratory:       Per HPI, otherwise negative  Cardiovascular:       Per HPI, otherwise negative  Gastrointestinal: Negative for vomiting.  Genitourinary: Negative.   Musculoskeletal:       Per HPI, otherwise negative  Skin: Negative.   Neurological: Negative for syncope.    Allergies  Review of patient's allergies indicates no known allergies.  Home Medications   Current Outpatient Rx  Name  Route  Sig  Dispense  Refill  . LISINOPRIL 20 MG PO TABS   Oral   Take 20 mg by mouth daily.         Marland Kitchen PRAVASTATIN SODIUM 40 MG PO TABS    Oral   Take 40 mg by mouth daily.           BP 123/70  Pulse 117  Temp 98.9 F (37.2 C) (Oral)  Resp 16  SpO2 94%  Physical Exam  Nursing note and vitals reviewed. Constitutional: She is oriented to person, place, and time. She appears well-developed and well-nourished. No distress.  HENT:  Head: Normocephalic and atraumatic.    Ears:  Eyes: Conjunctivae normal and EOM are normal.  Cardiovascular: Normal rate and regular rhythm.   Pulmonary/Chest: Effort normal and breath sounds normal. No stridor. No respiratory distress.  Abdominal: She exhibits no distension.  Musculoskeletal: She exhibits no edema.  Neurological: She is alert and oriented to person, place, and time. No cranial nerve deficit.  Skin: Skin is warm and dry.  Psychiatric: She has a normal mood and affect.    ED Course  Procedures (including critical care time)  Labs Reviewed  COMPREHENSIVE METABOLIC PANEL - Abnormal; Notable for the following:    Glucose, Bld 107 (*)     Total Protein 8.4 (*)     Alkaline Phosphatase 121 (*)     GFR calc non Af Amer 64 (*)  GFR calc Af Amer 74 (*)     All other components within normal limits  SEDIMENTATION RATE - Abnormal; Notable for the following:    Sed Rate 91 (*)     All other components within normal limits  CBC WITH DIFFERENTIAL   No results found.   No diagnosis found.  Update: Patient defers CT scan to rule out stroke.  Update: I discussed the case with our neurologist.  He recommends starting steroids, admission for consideration of temporal artery biopsy.  Update: Informed patient of her discussion so far, results, concern for temporal arteritis versus other phenomena.  The patient will have a CT performed.  Update: The patient states that over the past week she has had intermittent right eye visual changes, though none on evaluation here today.  Update: Visual acuity testing is unremarkable on the right eye, the patient describes occasional  floaters. MDM  This patient presents with concerns of new headache, new left facial dysesthesia and pain.  Patient has no visual capacity in her left eye.  However, given her description of new right visual changes, new headache, new tenderness the patient with a left temporal artery, or some suspicion of temporal arteritis.  Initial ESR is elevated, and the patient was started on steroids, admitted for further evaluation and management.        Gerhard Munch, MD 03/01/12 2013

## 2012-03-01 NOTE — ED Notes (Signed)
CT notified pt has removed hair pins and ready for transport.

## 2012-03-01 NOTE — ED Notes (Signed)
Patient transported to CT 

## 2012-03-01 NOTE — ED Notes (Signed)
Dr.Lockwood at bedside  

## 2012-03-02 ENCOUNTER — Observation Stay (HOSPITAL_COMMUNITY): Payer: Self-pay

## 2012-03-02 LAB — BASIC METABOLIC PANEL
Calcium: 10.1 mg/dL (ref 8.4–10.5)
Creatinine, Ser: 0.89 mg/dL (ref 0.50–1.10)
GFR calc non Af Amer: 72 mL/min — ABNORMAL LOW (ref >90)
Sodium: 136 mEq/L (ref 135–145)

## 2012-03-02 LAB — CBC
MCH: 28.3 pg (ref 26.0–34.0)
Platelets: 315 10*3/uL (ref 150–400)
RBC: 4.67 MIL/uL (ref 3.87–5.11)
RDW: 12.9 % (ref 11.5–15.5)
WBC: 5 10*3/uL (ref 4.0–10.5)

## 2012-03-02 LAB — GLUCOSE, CAPILLARY

## 2012-03-02 LAB — T4, FREE: Free T4: 1.12 ng/dL (ref 0.80–1.80)

## 2012-03-02 LAB — TSH: TSH: 0.554 u[IU]/mL (ref 0.350–4.500)

## 2012-03-02 MED ORDER — CYCLOBENZAPRINE HCL 10 MG PO TABS
5.0000 mg | ORAL_TABLET | Freq: Three times a day (TID) | ORAL | Status: DC | PRN
Start: 2012-03-02 — End: 2012-03-02

## 2012-03-02 MED ORDER — CYCLOBENZAPRINE HCL 10 MG PO TABS
5.0000 mg | ORAL_TABLET | Freq: Three times a day (TID) | ORAL | Status: DC | PRN
  Administered 2012-03-02: 10 mg via ORAL
  Filled 2012-03-02: qty 1

## 2012-03-02 MED ORDER — METOPROLOL TARTRATE 25 MG PO TABS
25.0000 mg | ORAL_TABLET | Freq: Two times a day (BID) | ORAL | Status: DC
  Administered 2012-03-02 – 2012-03-03 (×3): 25 mg via ORAL
  Filled 2012-03-02 (×4): qty 1

## 2012-03-02 NOTE — Progress Notes (Signed)
[facility] PROGRESS NOTE  Summer [name]:[id number] DOB: 12-May-1955 DOA: 03/01/2012 PCP: [name], MD  Assessment/Plan: Headache with elevated ESR and CRP Improved today, broad differential. Head CT is negative. Given there is concern for possible temporal arthritis as [name] result will start the patient on pulse IV Solu-Medrol with 1 gm daily for 3 days then start prednisone 90 mg daily. Patient does not have any focal neurologic deficit to suggest TIA/stroke, MRI of the brain pending. Briefly discussed with Dr. Hart [name], vascular surgery, who indicated that biopsy of the temporal artery could be done as outpatient in the next 1 to 2 weeks. TSH and free T4 pending.    History of retinal detachment with floaters/blurry vision in the right eye   Appreciate Dr. [name], opthalmology, evaluation. No new findings.  Hypertension  Continue lisinopril. Add low dose metoprolol.  Hyperlipidemia  Continue statin.   Tachycardia  Continue to monitor. Continue low dose metoprolol.  Prophylaxis  Lovenox.   Code Status: Full code Family Communication: Mother at bedside. Disposition Plan: DC tomorrow if MRI is negative.  Consultants:  Dr. [name], opthalmology   Procedures:  Head CT on 03/01/2012  MRI of brain on 03/02/2012  Antibiotics:  None.  HPI/Subjective: Headache improved.  Feeling better today.  No other specific concerns.  Objective: Filed Vitals:   03/01/12 1810 03/01/12 2036 03/02/12 0241 03/02/12 0502  BP: 142/82 132/58 142/78 139/62  Pulse: 107 98 94 95  Temp: 98.2 F (36.8 C) 97.8 F (36.6 C) 98.1 F (36.7 C) 98.2 F (36.8 C)  TempSrc: Oral Oral Oral Oral  Resp: 22 22 22 22   Height: 5' 5.5" (1.664 m)     Weight: 103.5 kg (228 lb 2.8 oz)   103.1 kg (227 lb 4.7 oz)  SpO2: 96% 99% 98% 97%    Intake/Output Summary (Last 24 hours) at 03/02/12 1016 Last data filed at 03/02/12 0520  Gross per 24 hour  Intake    840 ml  Output      0 ml  Net    840 ml     Filed Weights   03/01/12 1659 03/01/12 1810 03/02/12 0502  Weight: 90.719 kg (200 lb) 103.5 kg (228 lb 2.8 oz) 103.1 kg (227 lb 4.7 oz)    Exam: Physical Exam: General: Awake, Oriented, No acute distress. HEENT: EOMI. Neck: Supple CV: S1 and S2 Lungs: Clear to ascultation bilaterally Abdomen: Soft, Nontender, Nondistended, +bowel sounds. Ext: Good pulses. Trace edema.  Data Reviewed: Basic Metabolic Panel:  Lab 03/02/12 8119 03/01/12 1150  NA 136 136  K 4.3 4.2  CL 100 102  CO2 20 22  GLUCOSE 149* 107*  BUN 15 14  CREATININE 0.89 0.98  CALCIUM 10.1 9.9  MG -- --  PHOS -- --   Liver Function Tests:  Lab 03/01/12 1150  AST 23  ALT 20  ALKPHOS 121*  BILITOT 0.7  PROT 8.4*  ALBUMIN 3.6   No results found for this basename: LIPASE:5,AMYLASE:5 in the last 168 hours No results found for this basename: AMMONIA:5 in the last 168 hours CBC:  Lab 03/02/12 0520 03/01/12 1150  WBC 5.0 6.5  NEUTROABS -- 4.2  HGB 13.2 13.0  HCT 40.1 39.5  MCV 85.9 86.1  PLT 315 244   Cardiac Enzymes: No results found for this basename: CKTOTAL:5,CKMB:5,CKMBINDEX:5,TROPONINI:5 in the last 168 hours BNP (last 3 results) No results found for this basename: PROBNP:3 in the last 8760 hours CBG:  Lab 03/01/12 2109  GLUCAP 159*  No results found for this or any previous visit (from the past 240 hour(s)).   Studies: Ct Head Wo Contrast  03/01/2012  *RADIOLOGY REPORT*  Clinical Data: Headache.  Numbness and tingling in the left side of the face.  CT HEAD WITHOUT CONTRAST  Technique:  Contiguous axial images were obtained from the base of the skull through the vertex without contrast.  Comparison: No priors.  Findings: No acute intracranial abnormalities.  Specifically, no definite signs of acute/subacute cerebral ischemia, no evidence of acute intracranial hemorrhage, no mass, mass effect, hydrocephalus or abnormal intra or extra-axial fluid collections.  Visualized paranasal sinuses  and mastoids are well pneumatized.  No acute displaced skull fractures are identified.  IMPRESSION: 1.  No acute intracranial abnormalities. 2.  The appearance of the brain is normal.   Original Report Authenticated By: Trudie Reed, M.D.     Scheduled Meds:   . enoxaparin (LOVENOX) injection  40 mg Subcutaneous Q24H  . influenza  inactive virus vaccine  0.5 mL Intramuscular Tomorrow-1000  . lisinopril  20 mg Oral Daily  . methylPREDNISolone (SOLU-MEDROL) injection  1,000 mg Intravenous Daily   Followed by  . predniSONE  90 mg Oral Q breakfast  . metoprolol tartrate  25 mg Oral BID  . simvastatin  20 mg Oral q1800   Continuous Infusions:   Principal Problem:  *Headache Active Problems:  Hypertension  Hyperlipidemia  Tachycardia   Summer [name]  [facility] Pager [phone]. If 7PM-7AM, please contact night-coverage at [email or web address], password [facility] 03/02/2012, 10:16 AM  LOS: 1 day

## 2012-03-02 NOTE — Care Management (Signed)
UR completed 

## 2012-03-02 NOTE — Consult Note (Signed)
Reason for consult:  HPI: Summer [name] is an 57 y.o. female who we are asked to see for further evaluation of floaters OD.    Summer [name] is not certain but believes she's noted floaters over the prior 7 days.  Currently she sees no new floaters.  She is not certain if the floaters were in OD, OS, or OU.  She does have ocassional flashes of light - when climbing stairs and bending over.  She denies any current FOL.  She denies any change or loss of vision OD.  The left eye has limited peripheral vision that is unchanged.    Notably the patient has been admitted for left sided facial pain and concern for possible GCA/TA. She has been placed on steroid as she was found to have an elevated CRP and ESR, with normal PLT.  Regarding the facial pain, the patient describes sharp, vibrating, electric type pain overlying the L maxilla.  She denies headaches, brown or scalp pain or tenderness; she denies jaw or tongue claudication.     Past Medical History  Diagnosis Date  . Retinal detachment   . Hypertension   . Hyperlipemia    History reviewed. No pertinent past surgical history. Family History  Problem Relation Age of Onset  . Heart disease Mother   . Heart disease Father   . Diabetes Father    Current Facility-Administered Medications  Medication Dose Route Frequency Provider Last Rate Last Dose  . acetaminophen (TYLENOL) tablet 650 mg  650 mg Oral Q6H PRN Cristal Ford, MD       Or  . acetaminophen (TYLENOL) suppository 650 mg  650 mg Rectal Q6H PRN Cristal Ford, MD      . enoxaparin (LOVENOX) injection 40 mg  40 mg Subcutaneous Q24H Cristal Ford, MD   40 mg at 03/01/12 2013  . HYDROcodone-acetaminophen (NORCO/VICODIN) 5-325 MG per tablet 1 tablet  1 tablet Oral Q6H PRN Cristal Ford, MD   1 tablet at 03/01/12 2013  . influenza  inactive virus vaccine (FLUZONE/FLUARIX) injection 0.5 mL  0.5 mL Intramuscular Tomorrow-1000 Cristal Ford, MD      . lisinopril (PRINIVIL,ZESTRIL)  tablet 20 mg  20 mg Oral Daily Cristal Ford, MD   20 mg at 03/01/12 2013  . methylPREDNISolone sodium succinate (SOLU-MEDROL) 1,000 mg in sodium chloride 0.9 % 50 mL IVPB  1,000 mg Intravenous Daily Cristal Ford, MD       Followed by  . predniSONE (DELTASONE) tablet 90 mg  90 mg Oral Q breakfast Cristal Ford, MD      . ondansetron [facility]) tablet 4 mg  4 mg Oral Q6H PRN Cristal Ford, MD       Or  . ondansetron (ZOFRAN) injection 4 mg  4 mg Intravenous Q6H PRN Cristal Ford, MD      . simvastatin (ZOCOR) tablet 20 mg  20 mg Oral q1800 Cristal Ford, MD   20 mg at 03/01/12 2013   No Known Allergies History   Social History  . Marital Status: Divorced    Spouse Name: N/A    Number of Children: N/A  . Years of Education: N/A   Occupational History  . Not on file.   Social History Main Topics  . Smoking status: Never Smoker   . Smokeless tobacco: Not on file  . Alcohol Use: No  . Drug Use: No  . Sexually Active:    Other Topics Concern  .  Not on file   Social History Narrative  . No narrative on file    POH:    Hx of RD OU.  S/p Scleral buckle OS.   S/P revision/removal of scleral buckle OS due to persistent eye pain (?).  S/p Vit and gas OD.  S/p phaco/pciol OD.  Intraocular surgeries performed with: [facility] (Dr Ashley Royalty; gso), Dr. Letitia Caul (gso), and Dr. Sharen Heck [facility]).    Review of systems: As per HPI , PMH, and admission H and P.   Physical Exam:  Blood pressure 139/62, pulse 95, temperature 98.2 F (36.8 C), temperature source Oral, resp. rate 22, height 5' 5.5" (1.664 m), weight 103.1 kg (227 lb 4.7 oz), SpO2 97.00%.   VA cc (OTC rdrs):  OD 20/25+  OS  CF eccentrically in termporal field  Pupils:   OD oblong/irreg; non-reactive            OS miotic; non-reactive.  IOP (T pen)  OD 17    OS  13  CVF: OD full to CF   OS small area of preserved field temporally.   Motility:  OD full ductions  OS full ductions  Balance/alignment:   LXT   Slit lamp  examination:                                 OD                                       External/adnexa: Normal                                      Lids/lashes:        Normal                                      Conjunctiva        White, quiet        Cornea:              Clear                  AC:                     Deep, quiet                                Iris:                     Post surgical        Lens:                  PCIOL                                      OS                                       External/adnexa: Normal  Lids/lashes:        Normal                                      Conjunctiva        White, quiet        Cornea:              Clear                  AC:                     Deep, quiet                                Iris:                     Normal        Lens:                  PCIOL      Dilated fundus exam: (Neo 2.5; Myd 1%)      OD Vitreous            Clear, quiet                                Optic Disc:       Normal, perfused                      Macula:             Flat                                            Vessels:           Normal caliber,distribution         Periphery:         Peripheral CR scars c/w cryo; flat, attached.                                    OS Vitreous            Clear, quiet                                Optic Disc:       pallorous                      Macula:             Flat                                            Vessels:           Normal caliber,distribution         Periphery:         CR scarring; evidence of prior buckle       V1 intact bilaterally; V2 intact  R, subjectively decreased L  V3 intact bilaterally.  VII intact OU.    Labs/studies: Results for orders placed during the hospital encounter of 03/01/12 (from the past 48 hour(s))  CBC WITH DIFFERENTIAL     Status: Normal   Collection Time   03/01/12 11:50 AM      Component Value Range Comment   WBC 6.5  4.0 -  10.5 K/uL    RBC 4.59  3.87 - 5.11 MIL/uL    Hemoglobin 13.0  12.0 - 15.0 g/dL    HCT 16.1  09.6 - 04.5 %    MCV 86.1  78.0 - 100.0 fL    MCH 28.3  26.0 - 34.0 pg    MCHC 32.9  30.0 - 36.0 g/dL    RDW 40.9  81.1 - 91.4 %    Platelets 244  150 - 400 K/uL    Neutrophils Relative 65  43 - 77 %    Neutro Abs 4.2  1.7 - 7.7 K/uL    Lymphocytes Relative 24  12 - 46 %    Lymphs Abs 1.5  0.7 - 4.0 K/uL    Monocytes Relative 9  3 - 12 %    Monocytes Absolute 0.6  0.1 - 1.0 K/uL    Eosinophils Relative 2  0 - 5 %    Eosinophils Absolute 0.1  0.0 - 0.7 K/uL    Basophils Relative 1  0 - 1 %    Basophils Absolute 0.0  0.0 - 0.1 K/uL   COMPREHENSIVE METABOLIC PANEL     Status: Abnormal   Collection Time   03/01/12 11:50 AM      Component Value Range Comment   Sodium 136  135 - 145 mEq/L    Potassium 4.2  3.5 - 5.1 mEq/L    Chloride 102  96 - 112 mEq/L    CO2 22  19 - 32 mEq/L    Glucose, Bld 107 (*) 70 - 99 mg/dL    BUN 14  6 - 23 mg/dL    Creatinine, Ser 7.82  0.50 - 1.10 mg/dL    Calcium 9.9  8.4 - 95.6 mg/dL    Total Protein 8.4 (*) 6.0 - 8.3 g/dL    Albumin 3.6  3.5 - 5.2 g/dL    AST 23  0 - 37 U/L    ALT 20  0 - 35 U/L    Alkaline Phosphatase 121 (*) 39 - 117 U/L    Total Bilirubin 0.7  0.3 - 1.2 mg/dL    GFR calc non Af Amer 64 (*) >90 mL/min    GFR calc Af Amer 74 (*) >90 mL/min   SEDIMENTATION RATE     Status: Abnormal   Collection Time   03/01/12 11:50 AM      Component Value Range Comment   Sed Rate 91 (*) 0 - 22 mm/hr   C-REACTIVE PROTEIN     Status: Abnormal   Collection Time   03/01/12  4:52 PM      Component Value Range Comment   CRP 1.0 (*) <0.60 mg/dL   GLUCOSE, CAPILLARY     Status: Abnormal   Collection Time   03/01/12  9:09 PM      Component Value Range Comment   Glucose-Capillary 159 (*) 70 - 99 mg/dL    Comment 1 Notify RN     BASIC METABOLIC PANEL     Status: Abnormal   Collection Time   03/02/12  5:20 AM  Component Value Range Comment   Sodium 136   135 - 145 mEq/L    Potassium 4.3  3.5 - 5.1 mEq/L    Chloride 100  96 - 112 mEq/L    CO2 20  19 - 32 mEq/L    Glucose, Bld 149 (*) 70 - 99 mg/dL    BUN 15  6 - 23 mg/dL    Creatinine, Ser 5.40  0.50 - 1.10 mg/dL    Calcium 98.1  8.4 - 10.5 mg/dL    GFR calc non Af Amer 72 (*) >90 mL/min    GFR calc Af Amer 84 (*) >90 mL/min   CBC     Status: Normal   Collection Time   03/02/12  5:20 AM      Component Value Range Comment   WBC 5.0  4.0 - 10.5 K/uL    RBC 4.67  3.87 - 5.11 MIL/uL    Hemoglobin 13.2  12.0 - 15.0 g/dL    HCT 19.1  47.8 - 29.5 %    MCV 85.9  78.0 - 100.0 fL    MCH 28.3  26.0 - 34.0 pg    MCHC 32.9  30.0 - 36.0 g/dL    RDW 62.1  30.8 - 65.7 %    Platelets 315  150 - 400 K/uL    Ct Head Wo Contrast  03/01/2012  *RADIOLOGY REPORT*  Clinical Data: Headache.  Numbness and tingling in the left side of the face.  CT HEAD WITHOUT CONTRAST  Technique:  Contiguous axial images were obtained from the base of the skull through the vertex without contrast.  Comparison: No priors.  Findings: No acute intracranial abnormalities.  Specifically, no definite signs of acute/subacute cerebral ischemia, no evidence of acute intracranial hemorrhage, no mass, mass effect, hydrocephalus or abnormal intra or extra-axial fluid collections.  Visualized paranasal sinuses and mastoids are well pneumatized.  No acute displaced skull fractures are identified.  IMPRESSION: 1.  No acute intracranial abnormalities. 2.  The appearance of the brain is normal.   Original Report Authenticated By: Trudie Reed, M.D.                              Assessment and Plan:   Summer [name] is an 57 y.o. female who we are asked to see for further evaluation of floaters OD with:   No new/acute findings in either eye on bedside and 20D examination.  Recommend:  Alert primary team to any new visual symptoms (Flashes, floaters, loss of vision); return to her established primary eye care provider following discharge  for repeat dilated examination in clinic  -- Left maxillary pain / paresthesias of undetermined etiology.  Admitted and treated with steroid due to concern of possible GCA/TA. Vasc surg has been consulted to perform TA biopsy this week.   Defer further eval/mgt to primary.   All of the above information was relayed to the patient and/or patient family.  Ophthalmic warning signs and symptoms were reviewed, and clear instructions for immediate phone contact and/or immediate return to the ED were provided should any of these signs or symptoms occur.  Follow up contact information was provided.  Again, she should return to see her primary retina specialist at discharge (has been followed by 3 previously).  All questions were answered.   Summer [name] 03/02/2012, 8:47 AM  [facility] [phone]

## 2012-03-03 ENCOUNTER — Inpatient Hospital Stay (HOSPITAL_COMMUNITY): Payer: Self-pay

## 2012-03-03 LAB — GLUCOSE, CAPILLARY: Glucose-Capillary: 143 mg/dL — ABNORMAL HIGH (ref 70–99)

## 2012-03-03 MED ORDER — PREDNISONE 10 MG PO TABS: 90.0000 mg | ORAL_TABLET | Freq: Every day | ORAL | Status: DC

## 2012-03-03 MED ORDER — METOPROLOL TARTRATE 25 MG PO TABS: 25.0000 mg | ORAL_TABLET | Freq: Two times a day (BID) | ORAL | Status: DC

## 2012-03-03 MED ORDER — ALUM & MAG HYDROXIDE-SIMETH 200-200-20 MG/5ML PO SUSP: 30.0000 mL | Freq: Four times a day (QID) | ORAL | Status: DC | PRN

## 2012-03-03 NOTE — Discharge Summary (Signed)
Physician Discharge Summary  Summer [name] WNU:[id number] DOB: March 18, 1955 DOA: 03/01/2012  PCP: [name], MD  Admit date: 03/01/2012 Discharge date: 03/03/2012  Time spent: 25 minutes  Recommendations for Outpatient Follow-up:  Please followup with Vascular Surgery for temporal artery biopsy.  Please followup with [name], MD (PCP) in 1 week.  Please followup with Optho (eye) in 1 week.  Discharge Diagnoses:  Principal Problem:  *Headache Active Problems:  Hypertension  Hyperlipidemia  Tachycardia   Discharge Condition: Stable  Diet recommendation: Heart healthy diet  Filed Weights   03/01/12 1810 03/02/12 0502 03/03/12 0547  Weight: 103.5 kg (228 lb 2.8 oz) 103.1 kg (227 lb 4.7 oz) 103.057 kg (227 lb 3.2 oz)    History of present illness:  Summer [name] is a 57 y.o. African American female with history of hypertension, hyperlipidemia, retinal detachment with patient being legally blind in left eye and has had a retinal detachment in her right eye but has preserved vision who presents with left sided headache and numbness on 03/01/2012.  Hospital Course:  Headache with elevated ESR and CRP Head CT is negative. Given there is concern for possible temporal arthritis, completed pulse IV Solu-Medrol with 1 gm daily for 3 days then start prednisone 90 mg daily tomorrow. Patient does not have any focal neurologic deficit to suggest TIA/stroke, MRI of the brain negative. Briefly discussed with Dr. Hart [name] on admission, vascular surgery, who indicated that biopsy of the temporal artery could be done as outpatient in the next 1 to 2 weeks. TSH and free T4 normal. Patient's blood sugars were checked in the hospital and was in the low 100s on average. Once the biopsy results come back and is negative can discontinue steroids.   History of retinal detachment with floaters/blurry vision in the right eye   Appreciate Dr. [name], opthalmology, evaluation. No new  findings.  Hypertension  Stable. Continue lisinopril and continue low dose metoprolol which was added in the hospital.  Hyperlipidemia  Continue statin.   Tachycardia  Resolved.  Consultants:  Dr. [name], opthalmology   Procedures:  Head CT on 03/01/2012  MRI of brain on 03/02/2012  Antibiotics:  None.  Discharge Exam: Filed Vitals:   03/02/12 1351 03/02/12 2052 03/03/12 0547 03/03/12 0945  BP: 119/61 128/54 123/69 133/73  Pulse: 74 71 68 81  Temp: 97.8 F (36.6 C) 97.3 F (36.3 C) 97.8 F (36.6 C)   TempSrc: Oral Oral Oral   Resp: 20  18   Height:      Weight:   103.057 kg (227 lb 3.2 oz)   SpO2: 100% 97% 99%    Discharge Instructions  Discharge Orders    Future Orders Please Complete By Expires   Diet - low sodium heart healthy      Increase activity slowly      Discharge instructions      Comments:   Please followup with Vascular Surgery for temporal artery biopsy.  Please followup with [name], MD (PCP) in 1 week.  Please followup with Optho (eye) in 1 week.       Medication List     As of 03/03/2012 11:16 AM    TAKE these medications         lisinopril 20 MG tablet   Commonly known as: PRINIVIL,ZESTRIL   Take 20 mg by mouth daily.      metoprolol tartrate 25 MG tablet   Commonly known as: LOPRESSOR   Take 1 tablet (25 mg total) by mouth  2 (two) times daily.      pravastatin 40 MG tablet   Commonly known as: PRAVACHOL   Take 40 mg by mouth daily.      predniSONE 10 MG tablet   Commonly known as: DELTASONE   Take 9 tablets (90 mg total) by mouth daily with breakfast.           Follow-up Information    Follow up with Josephina Gip, MD. Schedule an appointment as soon as possible for a visit in 1 week. (Please talk to Darel Hong or Okey Regal to arrange the appointment for temporal artery biopsy.)    Contact information:   [address] [place] [address] [phone]       Follow up with [name], MD. Schedule an appointment  as soon as possible for a visit in 1 week.   Contact information:   [address] [place] [address] [phone]       Follow up with Optho . Schedule an appointment as soon as possible for a visit in 1 week.          The results of significant diagnostics from this hospitalization (including imaging, microbiology, ancillary and laboratory) are listed below for reference.    Significant Diagnostic Studies: Ct Head Wo Contrast  03/01/2012  *RADIOLOGY REPORT*  Clinical Data: Headache.  Numbness and tingling in the left side of the face.  CT HEAD WITHOUT CONTRAST  Technique:  Contiguous axial images were obtained from the base of the skull through the vertex without contrast.  Comparison: No priors.  Findings: No acute intracranial abnormalities.  Specifically, no definite signs of acute/subacute cerebral ischemia, no evidence of acute intracranial hemorrhage, no mass, mass effect, hydrocephalus or abnormal intra or extra-axial fluid collections.  Visualized paranasal sinuses and mastoids are well pneumatized.  No acute displaced skull fractures are identified.  IMPRESSION: 1.  No acute intracranial abnormalities. 2.  The appearance of the brain is normal.   Original Report Authenticated By: Trudie Reed, M.D.    Mr Brain Wo Contrast  03/03/2012  *RADIOLOGY REPORT*  Clinical Data: Left sided headache and numbness  MRI HEAD WITHOUT CONTRAST  Technique:  Multiplanar, multiecho pulse sequences of the brain and surrounding structures were obtained according to standard protocol without intravenous contrast.  Comparison: Head CT of 03/02/2011  Findings: Diffusion imaging does not show any acute or subacute infarction.  The brainstem and cerebellum are normal.  The cerebral hemispheres are normal with exception of a single 3-4 mm focus of abnormal white matter signal in the left frontal white matter.  As an isolated finding in a person of this age, this is unlikely to be significant.  This is  consistent with a nonspecific focus of gliosis.  Old small vessel infarction, migraine related focus, post- traumatic focus or demyelinating focus could cause this appearance. Certainly, the overall pattern is not that of widespread demyelinating disease.  No cortical or large vessel territory abnormality.  No mass lesion, hemorrhage, hydrocephalus or extra- axial collection.  The no pituitary mass.  No inflammatory sinus disease.  No skull or skull base lesion.  IMPRESSION: No acute finding.  Normal exam except for a single 3-4 mm focus white matter signal in the left frontal white matter.  As a isolated finding, not likely to be significant.  See above for discussion.   Original Report Authenticated By: Paulina Fusi, M.D.     Microbiology: No results found for this or any previous visit (from the past 240 hour(s)).  Labs: Basic Metabolic Panel:  Lab 03/02/12 1610 03/01/12 1150  NA 136 136  K 4.3 4.2  CL 100 102  CO2 20 22  GLUCOSE 149* 107*  BUN 15 14  CREATININE 0.89 0.98  CALCIUM 10.1 9.9  MG -- --  PHOS -- --   Liver Function Tests:  Lab 03/01/12 1150  AST 23  ALT 20  ALKPHOS 121*  BILITOT 0.7  PROT 8.4*  ALBUMIN 3.6   No results found for this basename: LIPASE:5,AMYLASE:5 in the last 168 hours No results found for this basename: AMMONIA:5 in the last 168 hours CBC:  Lab 03/02/12 0520 03/01/12 1150  WBC 5.0 6.5  NEUTROABS -- 4.2  HGB 13.2 13.0  HCT 40.1 39.5  MCV 85.9 86.1  PLT 315 244   Cardiac Enzymes: No results found for this basename: CKTOTAL:5,CKMB:5,CKMBINDEX:5,TROPONINI:5 in the last 168 hours BNP: BNP (last 3 results) No results found for this basename: PROBNP:3 in the last 8760 hours CBG:  Lab 03/03/12 0624 03/02/12 2152 03/02/12 1556 03/02/12 1126 03/01/12 2109  GLUCAP 143* 202* 133* 103* 159*    Signed:  Maritza Hosterman A  [facility] 03/03/2012, 11:16 AM

## 2012-03-03 NOTE — Care Management Note (Signed)
    Page 1 of 1   03/03/2012     3:19:12 PM   CARE MANAGEMENT NOTE 03/03/2012  Patient:  Summer [name], Summer [name]   Account Number:  [id number]  Date Initiated:  03/03/2012  Documentation initiated by:  Tera Mater  Subjective/Objective Assessment:   57yo female admitted with Headache.     Action/Plan:   In to speak with pt. about financial concerns.  Presently, pt. does not have insurance.  Explained to pt. about applying for Medicaid and new Affordable Care insurance.   Anticipated DC Date:  03/03/2012   Anticipated DC Plan:  HOME/SELF CARE      DC Planning Services  CM consult      Choice offered to / List presented to:             Status of service:  Completed, signed off Medicare Important Message given?   (If response is "NO", the following Medicare IM given date fields will be blank) Date Medicare IM given:   Date Additional Medicare IM given:    Discharge Disposition:  HOME/SELF CARE  Per UR Regulation:  Reviewed for med. necessity/level of care/duration of stay  If discussed at Long Length of Stay Meetings, dates discussed:    Comments:  02/22/12 1330 Advised pt. to call financial assistance to work out payment plan for hospital bill and dr. office visits. Tera Mater, RN, BSN NCM [phone]

## 2012-03-03 NOTE — Progress Notes (Signed)
Pt given DC instructions and verbalized understanding.  Pt DC home via wc.  

## 2012-03-03 NOTE — Progress Notes (Signed)
Pt c/o indigestion.  Dr. Betti Cruz text/paged and asked for an order for malox.  Will continue to monitor.

## 2012-03-03 NOTE — Progress Notes (Signed)
[facility] PROGRESS NOTE  Summer [name] ZOX:[id number] DOB: [birth year] DOA: 03/01/2012 PCP: [name], MD  Assessment/Plan: Headache with elevated ESR and CRP Head CT is negative. Given there is concern for possible temporal arthritis, completed pulse IV Solu-Medrol with 1 gm daily for 3 days then start prednisone 90 mg daily tomorrow. Patient does not have any focal neurologic deficit to suggest TIA/stroke, MRI of the brain negative. Briefly discussed with Dr. Hart [name] on admission, vascular surgery, who indicated that biopsy of the temporal artery could be done as outpatient in the next 1 to 2 weeks. TSH and free T4 normal.   History of retinal detachment with floaters/blurry vision in the right eye   Appreciate Dr. [name], opthalmology, evaluation. No new findings.  Hypertension  Stable. Continue lisinopril and continue low dose metoprolol which was added in the hospital.  Hyperlipidemia  Continue statin.   Tachycardia  Resolved.  Prophylaxis  Lovenox.   Code Status: Full code Family Communication: Family at bedside. Disposition Plan: DC home today.  Consultants:  Dr. [name], opthalmology   Procedures:  Head CT on 03/01/2012  MRI of brain on 03/02/2012  Antibiotics:  None.  HPI/Subjective: Feeling better today.  No other specific concerns.  Objective: Filed Vitals:   03/02/12 1351 03/02/12 2052 03/03/12 0547 03/03/12 0945  BP: 119/61 128/54 123/69 133/73  Pulse: 74 71 68 81  Temp: 97.8 F (36.6 C) 97.3 F (36.3 C) 97.8 F (36.6 C)   TempSrc: Oral Oral Oral   Resp: 20  18   Height:      Weight:   103.057 kg (227 lb 3.2 oz)   SpO2: 100% 97% 99%     Intake/Output Summary (Last 24 hours) at 03/03/12 1107 Last data filed at 03/03/12 0852  Gross per 24 hour  Intake   1180 ml  Output      0 ml  Net   1180 ml   Filed Weights   03/01/12 1810 03/02/12 0502 03/03/12 0547  Weight: 103.5 kg (228 lb 2.8 oz) 103.1 kg (227 lb 4.7 oz) 103.057 kg (227  lb 3.2 oz)    Exam: Physical Exam: General: Awake, Oriented, No acute distress. HEENT: EOMI. Neck: Supple CV: S1 and S2 Lungs: Clear to ascultation bilaterally Abdomen: Soft, Nontender, Nondistended, +bowel sounds. Ext: Good pulses. Trace edema.  Data Reviewed: Basic Metabolic Panel:  Lab 03/02/12 8119 03/01/12 1150  NA 136 136  K 4.3 4.2  CL 100 102  CO2 20 22  GLUCOSE 149* 107*  BUN 15 14  CREATININE 0.89 0.98  CALCIUM 10.1 9.9  MG -- --  PHOS -- --   Liver Function Tests:  Lab 03/01/12 1150  AST 23  ALT 20  ALKPHOS 121*  BILITOT 0.7  PROT 8.4*  ALBUMIN 3.6   No results found for this basename: LIPASE:5,AMYLASE:5 in the last 168 hours No results found for this basename: AMMONIA:5 in the last 168 hours CBC:  Lab 03/02/12 0520 03/01/12 1150  WBC 5.0 6.5  NEUTROABS -- 4.2  HGB 13.2 13.0  HCT 40.1 39.5  MCV 85.9 86.1  PLT 315 244   Cardiac Enzymes: No results found for this basename: CKTOTAL:5,CKMB:5,CKMBINDEX:5,TROPONINI:5 in the last 168 hours BNP (last 3 results) No results found for this basename: PROBNP:3 in the last 8760 hours CBG:  Lab 03/03/12 0624 03/02/12 2152 03/02/12 1556 03/02/12 1126 03/01/12 2109  GLUCAP 143* 202* 133* 103* 159*    No results found for this or any previous visit (from the past  240 hour(s)).   Studies: Ct Head Wo Contrast  03/01/2012  *RADIOLOGY REPORT*  Clinical Data: Headache.  Numbness and tingling in the left side of the face.  CT HEAD WITHOUT CONTRAST  Technique:  Contiguous axial images were obtained from the base of the skull through the vertex without contrast.  Comparison: No priors.  Findings: No acute intracranial abnormalities.  Specifically, no definite signs of acute/subacute cerebral ischemia, no evidence of acute intracranial hemorrhage, no mass, mass effect, hydrocephalus or abnormal intra or extra-axial fluid collections.  Visualized paranasal sinuses and mastoids are well pneumatized.  No acute displaced  skull fractures are identified.  IMPRESSION: 1.  No acute intracranial abnormalities. 2.  The appearance of the brain is normal.   Original Report Authenticated By: Trudie Reed, M.D.    Mr Brain Wo Contrast  03/03/2012  *RADIOLOGY REPORT*  Clinical Data: Left sided headache and numbness  MRI HEAD WITHOUT CONTRAST  Technique:  Multiplanar, multiecho pulse sequences of the brain and surrounding structures were obtained according to standard protocol without intravenous contrast.  Comparison: Head CT of 03/02/2011  Findings: Diffusion imaging does not show any acute or subacute infarction.  The brainstem and cerebellum are normal.  The cerebral hemispheres are normal with exception of a single 3-4 mm focus of abnormal white matter signal in the left frontal white matter.  As an isolated finding in a person of this age, this is unlikely to be significant.  This is consistent with a nonspecific focus of gliosis.  Old small vessel infarction, migraine related focus, post- traumatic focus or demyelinating focus could cause this appearance. Certainly, the overall pattern is not that of widespread demyelinating disease.  No cortical or large vessel territory abnormality.  No mass lesion, hemorrhage, hydrocephalus or extra- axial collection.  The no pituitary mass.  No inflammatory sinus disease.  No skull or skull base lesion.  IMPRESSION: No acute finding.  Normal exam except for a single 3-4 mm focus white matter signal in the left frontal white matter.  As a isolated finding, not likely to be significant.  See above for discussion.   Original Report Authenticated By: Paulina Fusi, M.D.     Scheduled Meds:    . enoxaparin (LOVENOX) injection  40 mg Subcutaneous Q24H  . lisinopril  20 mg Oral Daily  . methylPREDNISolone (SOLU-MEDROL) injection  1,000 mg Intravenous Daily   Followed by  . predniSONE  90 mg Oral Q breakfast  . metoprolol tartrate  25 mg Oral BID  . simvastatin  20 mg Oral q1800    Continuous Infusions:   Principal Problem:  *Headache Active Problems:  Hypertension  Hyperlipidemia  Tachycardia   Henya Aguallo A  [facility] Pager [phone]. If 7PM-7AM, please contact night-coverage at [email or web address], password [facility] 03/03/2012, 11:07 AM  LOS: 2 days

## 2012-03-04 ENCOUNTER — Encounter: Payer: Self-pay | Admitting: Vascular Surgery

## 2012-03-04 ENCOUNTER — Ambulatory Visit (INDEPENDENT_AMBULATORY_CARE_PROVIDER_SITE_OTHER): Payer: Self-pay | Admitting: Vascular Surgery

## 2012-03-04 ENCOUNTER — Other Ambulatory Visit: Payer: Self-pay

## 2012-03-04 VITALS — BP 103/68 | HR 77 | Resp 18 | Ht 65.5 in | Wt 231.0 lb

## 2012-03-04 DIAGNOSIS — R51 Headache: Secondary | ICD-10-CM

## 2012-03-04 NOTE — Progress Notes (Signed)
Subjective:     Patient ID: Summer [name], female   DOB: 12/26/1955, 57 y.o.   MRN: [id number]  HPI this 57 year old female was referred by Dr. Jeanie Sewer in the emergency department at [facility] hospital for possible temporal artery biopsy. This patient has chronic blindness in the left eye due to retinal detachment has chronic headaches. She has developed occasional numbness on the left side of the face recently. This is also been accompanied by the chronic headaches. Vision in her left eye is unchanged because she is legally blind in the left eye she states. She describes some chronic floaters in the right eye. She has no history of stroke. She was evaluated in the hospital and steroids were initiated. He was given IV Solu-Medrol 1 g daily for 3 days and started on 90 mg of prednisone per day. He has no history of connective tissue disorders such as lupus fibromyalgia or scleroderma. She was found to have an elevated sedimentation rate at 91 while in the hospital. Her symptoms occur intermittently but she states she has had no symptoms in the last 48 hours.  Past Medical History  Diagnosis Date  . Retinal detachment   . Hypertension   . Hyperlipemia     History  Substance Use Topics  . Smoking status: Never Smoker   . Smokeless tobacco: Never Used  . Alcohol Use: No    Family History  Problem Relation Age of Onset  . Heart disease Mother   . Heart disease Father   . Diabetes Father     No Known Allergies  Current outpatient prescriptions:cyclobenzaprine (FLEXERIL) 10 MG tablet, Take 10 mg by mouth 3 (three) times daily as needed., Disp: , Rfl: ;  lisinopril (PRINIVIL,ZESTRIL) 20 MG tablet, Take 20 mg by mouth daily., Disp: , Rfl: ;  methocarbamol (ROBAXIN) 500 MG tablet, Take 500 mg by mouth 3 (three) times daily., Disp: , Rfl:  metoprolol tartrate (LOPRESSOR) 25 MG tablet, Take 1 tablet (25 mg total) by mouth 2 (two) times daily., Disp: 60 tablet, Rfl: 0;  pravastatin (PRAVACHOL) 40 MG  tablet, Take 40 mg by mouth daily., Disp: , Rfl: ;  predniSONE (DELTASONE) 10 MG tablet, Take 9 tablets (90 mg total) by mouth daily with breakfast., Disp: 270 tablet, Rfl: 0  BP 103/68  Pulse 77  Resp 18  Ht 5' 5.5" (1.664 m)  Wt 231 lb (104.781 kg)  BMI 37.86 kg/m2  Body mass index is 37.86 kg/(m^2).           Review of Systems denies chest pain, dyspnea on exertion, PND, orthopnea, hemoptysis, chronic cough, asthma.     Objective:   Physical Exam blood pressure 103 were 68 heart rate 77 respirations 18 Gen.-alert and oriented x3 in no apparent distress HEENT blind in left eye-2+ superficial temporal pulse bilaterally. No tenderness in temporal area. Artery does not feel thickened to palpation. Lungs no rhonchi or wheezing Cardiovascular regular rhythm no murmurs carotid pulses 3+ palpable no bruits audible Abdomen soft nontender no palpable masses Musculoskeletal free of  major deformities Skin clear -no rashes Neurologic normal Lower extremities 3+ femoral and dorsalis pedis pulses palpable bilaterally with no edema      Assessment:     Chronic left-sided headaches and chronic blindness left eye with recent onset of intermittent numbness left face with elevated sed rate Emergency room physician has requested temporal artery biopsy to rule out temporal arteritis Doubt very seriously that this patient has temporal arteritis since her headaches and visual  loss or chronic and only new symptoms aren't numbness the left face  I have offered to perform left temporal artery biopsy outpatient and schedule this for Wednesday, January 22 at [facility] hospital as outpatient. I also explained to patient the left leg in all likelihood this will be a negative biopsy-low index of suspicion     Plan:     Patient will make appointment with Dr. Delford Field her medical doctor and discuss this and they will decide whether to cancel temporal artery biopsy or to leave this on schedule for next  Wednesday or to make referral to rheumatologist. I plan to proceed with this temporal artery biopsy on the left side unless it is otherwise cancelled--- had a long discussion with patient and her sister and they understand this

## 2012-03-05 ENCOUNTER — Encounter (HOSPITAL_COMMUNITY): Payer: Self-pay

## 2012-03-10 ENCOUNTER — Encounter (HOSPITAL_COMMUNITY): Payer: Self-pay

## 2012-03-10 ENCOUNTER — Encounter (HOSPITAL_COMMUNITY)
Admission: RE | Admit: 2012-03-10 | Discharge: 2012-03-10 | Disposition: A | Discharge status: Home / Self Care | Payer: Self-pay | Source: Ambulatory Visit | Attending: Anesthesiology | Admitting: Anesthesiology

## 2012-03-10 ENCOUNTER — Encounter (HOSPITAL_COMMUNITY)
Admission: RE | Admit: 2012-03-10 | Discharge: 2012-03-10 | Disposition: A | Discharge status: Home / Self Care | Payer: Self-pay | Source: Ambulatory Visit | Attending: Vascular Surgery | Admitting: Vascular Surgery

## 2012-03-10 HISTORY — DX: Blindness, one eye, unspecified eye: H54.40

## 2012-03-10 HISTORY — DX: Headache: R51

## 2012-03-10 HISTORY — DX: Anxiety disorder, unspecified: F41.9

## 2012-03-10 HISTORY — DX: Unspecified fracture of unspecified lumbar vertebra, initial encounter for closed fracture: S32.009A

## 2012-03-10 LAB — SURGICAL PCR SCREEN
MRSA, PCR: NEGATIVE
Staphylococcus aureus: POSITIVE — AB

## 2012-03-10 NOTE — Progress Notes (Signed)
Requested anesthesia records from [facility] due reports that they have difficulty with intubation.

## 2012-03-10 NOTE — Pre-Procedure Instructions (Signed)
KIARALIZ RAFUSE  03/10/2012   Your procedure is scheduled on:  January 22  Report to [facility] at 07:30 AM.  Call this number if you have problems the morning of surgery: [phone]   Remember:   Do not eat food or drink liquids after midnight.   Take these medicines the morning of surgery with A SIP OF WATER: Tylenol, Metoprolol, Prednisone   Do not wear jewelry, make-up or nail polish.  Do not wear lotions, powders, or perfumes. You may wear deodorant.  Do not shave 48 hours prior to surgery. Men may shave face and neck.  Do not bring valuables to the hospital.  Contacts, dentures or bridgework may not be worn into surgery.  Leave suitcase in the car. After surgery it may be brought to your room.  For patients admitted to the hospital, checkout time is 11:00 AM the day of discharge.   Patients discharged the day of surgery will not be allowed to drive home.  Name and phone number of your driver: Family/ Friend  Special Instructions: Shower using CHG 2 nights before surgery and the night before surgery.  If you shower the day of surgery use CHG.  Use special wash - you have one bottle of CHG for all showers.  You should use approximately 1/3 of the bottle for each shower.   Please read over the following fact sheets that you were given: Pain Booklet, Coughing and Deep Breathing and Surgical Site Infection Prevention

## 2012-03-10 NOTE — Progress Notes (Signed)
Per Okey Regal at [facility] do not need I-Stat 8 as ordered for PAT.

## 2012-03-11 MED ORDER — DEXTROSE 5 % IV SOLN
1.5000 g | INTRAVENOUS | Status: AC
  Administered 2012-03-12: 1.5 g via INTRAVENOUS
  Filled 2012-03-11: qty 1.5

## 2012-03-11 NOTE — Progress Notes (Signed)
Notified patient by voice message, new arrival time 6:00am per Dr. Hart [name].

## 2012-03-11 NOTE — Consult Note (Signed)
Anesthesia Chart Review:  Patient is a 57 year old female scheduled for left temporal artery biopsy by Dr. Hart [name] on 03/12/12. Procedure is posted for MAC.  History includes HTN, HLD, anxiety, headaches, retinal detachments, "lumbar vertebral fracture" (T8 compression fracture by xray on 06/14/11).  She reports being told once that she was a difficult intubation.  Records from [facility] and [facility] requested.    Labs from 03/02/12 noted.    CXR on 03/10/12 showed: 1. No acute cardiopulmonary abnormality.  2. Chronic T8 compression fracture.   EKG on 03/10/12 showed NSR, minimal voltage criteria for LVH, inferior infarct (age undetermined).  She had had multiple EKGs done since 2002 (see Muse).  She has had an anterior T wave abnormality and variability in significant of inferior q waves since at least 09/30/00 (with more prominent Q waves--primarily in lead III---present on EKG dating back to 09/23/06).  Her EKG on 03/10/12 was not felt significantly change from her previous tracing by the interpreting cardiologist.  (Her last stress test which was normal was on 10/01/00.)  She will be evaluated by her assigned anesthesiologist on the day of surgery to discuss the definitive anesthesia plan.  She has a history of difficult intubation, but would not anticipate need for GA for this procedure.  Shonna Chock, PA-C 03/11/12 1330

## 2012-03-12 ENCOUNTER — Encounter (HOSPITAL_COMMUNITY): Payer: Self-pay | Admitting: Vascular Surgery

## 2012-03-12 ENCOUNTER — Ambulatory Visit (HOSPITAL_COMMUNITY): Payer: Self-pay | Admitting: Vascular Surgery

## 2012-03-12 ENCOUNTER — Encounter (HOSPITAL_COMMUNITY): Payer: Self-pay | Admitting: *Deleted

## 2012-03-12 ENCOUNTER — Encounter (HOSPITAL_COMMUNITY)
Admission: RE | Disposition: A | Discharge status: Home / Self Care | Payer: Self-pay | Source: Ambulatory Visit | Attending: Vascular Surgery

## 2012-03-12 ENCOUNTER — Ambulatory Visit (HOSPITAL_COMMUNITY)
Admission: RE | Admit: 2012-03-12 | Discharge: 2012-03-12 | Disposition: A | Discharge status: Home / Self Care | Payer: Self-pay | Source: Ambulatory Visit | Attending: Vascular Surgery | Admitting: Vascular Surgery

## 2012-03-12 DIAGNOSIS — R51 Headache: Secondary | ICD-10-CM

## 2012-03-12 DIAGNOSIS — Z79899 Other long term (current) drug therapy: Secondary | ICD-10-CM | POA: Insufficient documentation

## 2012-03-12 DIAGNOSIS — Z01818 Encounter for other preprocedural examination: Secondary | ICD-10-CM | POA: Insufficient documentation

## 2012-03-12 DIAGNOSIS — E785 Hyperlipidemia, unspecified: Secondary | ICD-10-CM | POA: Insufficient documentation

## 2012-03-12 DIAGNOSIS — Z01812 Encounter for preprocedural laboratory examination: Secondary | ICD-10-CM | POA: Insufficient documentation

## 2012-03-12 DIAGNOSIS — Z01810 Encounter for preprocedural cardiovascular examination: Secondary | ICD-10-CM | POA: Insufficient documentation

## 2012-03-12 DIAGNOSIS — I10 Essential (primary) hypertension: Secondary | ICD-10-CM | POA: Insufficient documentation

## 2012-03-12 HISTORY — PX: ARTERY BIOPSY: SHX891

## 2012-03-12 SURGERY — BIOPSY TEMPORAL ARTERY
Anesthesia: Monitor Anesthesia Care | Site: Head | Laterality: Left | Wound class: Clean

## 2012-03-12 MED ORDER — PROPOFOL 10 MG/ML IV BOLUS
INTRAVENOUS | Status: DC | PRN
  Administered 2012-03-12 (×8): 10 mg via INTRAVENOUS

## 2012-03-12 MED ORDER — OXYCODONE HCL 5 MG PO TABS: 5.0000 mg | ORAL_TABLET | Freq: Once | ORAL | Status: DC | PRN

## 2012-03-12 MED ORDER — OXYCODONE HCL 5 MG/5ML PO SOLN: 5.0000 mg | Freq: Once | ORAL | Status: DC | PRN

## 2012-03-12 MED ORDER — LIDOCAINE HCL (PF) 1 % IJ SOLN
INTRAMUSCULAR | Status: DC | PRN
  Administered 2012-03-12: 30 mL

## 2012-03-12 MED ORDER — PROMETHAZINE HCL 25 MG/ML IJ SOLN: 6.2500 mg | INTRAMUSCULAR | Status: DC | PRN

## 2012-03-12 MED ORDER — SODIUM CHLORIDE 0.9 % IV SOLN: INTRAVENOUS | Status: DC

## 2012-03-12 MED ORDER — LIDOCAINE HCL (PF) 1 % IJ SOLN
INTRAMUSCULAR | Status: AC
  Filled 2012-03-12: qty 30

## 2012-03-12 MED ORDER — FENTANYL CITRATE 0.05 MG/ML IJ SOLN: 25.0000 ug | INTRAMUSCULAR | Status: DC | PRN

## 2012-03-12 MED ORDER — MUPIROCIN 2 % EX OINT
TOPICAL_OINTMENT | Freq: Two times a day (BID) | CUTANEOUS | Status: DC
  Administered 2012-03-12: 1 via NASAL
  Filled 2012-03-12: qty 22

## 2012-03-12 MED ORDER — MEPERIDINE HCL 25 MG/ML IJ SOLN: 6.2500 mg | INTRAMUSCULAR | Status: DC | PRN

## 2012-03-12 MED ORDER — FENTANYL CITRATE 0.05 MG/ML IJ SOLN
INTRAMUSCULAR | Status: DC | PRN
  Administered 2012-03-12: 50 ug via INTRAVENOUS

## 2012-03-12 MED ORDER — 0.9 % SODIUM CHLORIDE (POUR BTL) OPTIME
TOPICAL | Status: DC | PRN
  Administered 2012-03-12: 1000 mL

## 2012-03-12 MED ORDER — ZOLEDRONIC ACID 5 MG/100ML IV SOLN
INTRAVENOUS | Status: AC
  Filled 2012-03-12: qty 100

## 2012-03-12 MED ORDER — MUPIROCIN 2 % EX OINT
TOPICAL_OINTMENT | CUTANEOUS | Status: AC
  Filled 2012-03-12: qty 22

## 2012-03-12 MED ORDER — OXYCODONE HCL 5 MG PO TABS: 5.0000 mg | ORAL_TABLET | ORAL | Status: DC | PRN

## 2012-03-12 MED ORDER — MIDAZOLAM HCL 5 MG/5ML IJ SOLN
INTRAMUSCULAR | Status: DC | PRN
  Administered 2012-03-12: 2 mg via INTRAVENOUS

## 2012-03-12 MED ORDER — ONDANSETRON HCL 4 MG/2ML IJ SOLN
INTRAMUSCULAR | Status: DC | PRN
  Administered 2012-03-12: 4 mg via INTRAVENOUS

## 2012-03-12 MED ORDER — LACTATED RINGERS IV SOLN
INTRAVENOUS | Status: DC | PRN
  Administered 2012-03-12: 08:00:00 via INTRAVENOUS

## 2012-03-12 SURGICAL SUPPLY — 42 items
ADH SKN CLS APL DERMABOND .7 (GAUZE/BANDAGES/DRESSINGS) ×1
BALL CTTN LRG ABS STRL LF (GAUZE/BANDAGES/DRESSINGS) ×1
CANISTER SUCTION 2500CC (MISCELLANEOUS) ×2 IMPLANT
CLIP TI MEDIUM 6 (CLIP) ×1 IMPLANT
CLIP TI WIDE RED SMALL 6 (CLIP) ×1 IMPLANT
CLOTH BEACON ORANGE TIMEOUT ST (SAFETY) ×2 IMPLANT
CONT SPEC 4OZ CLIKSEAL STRL BL (MISCELLANEOUS) ×2 IMPLANT
COTTON STERILE ROLL (GAUZE/BANDAGES/DRESSINGS) ×1 IMPLANT
COTTONBALL LRG STERILE PKG (GAUZE/BANDAGES/DRESSINGS) ×2 IMPLANT
COVER SURGICAL LIGHT HANDLE (MISCELLANEOUS) ×2 IMPLANT
DECANTER SPIKE VIAL GLASS SM (MISCELLANEOUS) ×2 IMPLANT
DERMABOND ADVANCED (GAUZE/BANDAGES/DRESSINGS) ×1
DERMABOND ADVANCED .7 DNX12 (GAUZE/BANDAGES/DRESSINGS) ×1 IMPLANT
DRAPE LAPAROTOMY T 102X78X121 (DRAPES) ×2 IMPLANT
ELECT REM PT RETURN 9FT ADLT (ELECTROSURGICAL) ×2
ELECTRODE REM PT RTRN 9FT ADLT (ELECTROSURGICAL) ×1 IMPLANT
GEL ULTRASOUND 20GR AQUASONIC (MISCELLANEOUS) ×2 IMPLANT
GLOVE BIO SURGEON STRL SZ 6.5 (GLOVE) ×1 IMPLANT
GLOVE BIOGEL PI IND STRL 6.5 (GLOVE) IMPLANT
GLOVE BIOGEL PI IND STRL 7.0 (GLOVE) IMPLANT
GLOVE BIOGEL PI INDICATOR 6.5 (GLOVE) ×1
GLOVE BIOGEL PI INDICATOR 7.0 (GLOVE) ×1
GLOVE SS BIOGEL STRL SZ 7 (GLOVE) ×1 IMPLANT
GLOVE SUPERSENSE BIOGEL SZ 7 (GLOVE) ×1
GOWN STRL NON-REIN LRG LVL3 (GOWN DISPOSABLE) ×4 IMPLANT
KIT BASIN OR (CUSTOM PROCEDURE TRAY) ×2 IMPLANT
KIT ROOM TURNOVER OR (KITS) ×2 IMPLANT
NDL HYPO 25GX1X1/2 BEV (NEEDLE) ×1 IMPLANT
NEEDLE HYPO 25GX1X1/2 BEV (NEEDLE) ×4 IMPLANT
NS IRRIG 1000ML POUR BTL (IV SOLUTION) ×2 IMPLANT
PACK GENERAL/GYN (CUSTOM PROCEDURE TRAY) ×2 IMPLANT
PAD ARMBOARD 7.5X6 YLW CONV (MISCELLANEOUS) ×4 IMPLANT
SPONGE LAP 4X18 X RAY DECT (DISPOSABLE) ×2 IMPLANT
SUCTION FRAZIER TIP 10 FR DISP (SUCTIONS) ×2 IMPLANT
SUT PROLENE 6 0 BV (SUTURE) IMPLANT
SUT SILK 3 0 (SUTURE) ×2
SUT SILK 3-0 18XBRD TIE 12 (SUTURE) ×1 IMPLANT
SUT VICRYL 4-0 PS2 18IN ABS (SUTURE) ×2 IMPLANT
SYR CONTROL 10ML LL (SYRINGE) ×2 IMPLANT
TOWEL OR 17X24 6PK STRL BLUE (TOWEL DISPOSABLE) ×2 IMPLANT
TOWEL OR 17X26 10 PK STRL BLUE (TOWEL DISPOSABLE) ×2 IMPLANT
WATER STERILE IRR 1000ML POUR (IV SOLUTION) ×2 IMPLANT

## 2012-03-12 NOTE — Op Note (Signed)
OPERATIVE REPORT  Date of Surgery: 03/12/2012  Surgeon: Josephina Gip, MD  Assistant: Nurse  Pre-op Diagnosis: headache R/O Temporal Arteritis  Post-op Diagnosis: headache R/O Temporal Arteritis  Procedure: Procedure(s): BIOPSY TEMPORAL ARTERY-left Anesthesia: MAC  EBL: None  Complications: None  Procedure Details: The patient was taken to the operating room placed in the supine position at which time the left temporal area was exposed just anterior to the ear. The superficial temporal artery had a good pulse it was localized also with a Doppler and marked. After prepping and draping in routine sterile manner infiltration with 1% Xylocaine a short transverse incision was made just anterior to the ear overlying superficial femoral artery. Artery was dissected free. No other structures were injured. Artery was normal in appearance. He was dissected free over about a 1-2 cm length after ligating it proximally and distally with 3-0 silk ties it was excised and a segment sent to the lab for pathologic examination. Adequate hemostasis was achieved. Wound was closed in layers of Vicryl subcuticular fashion with Dermabond patient taken to recovery room in stable condition  Josephina Gip, MD 03/12/2012 9:32 AM

## 2012-03-12 NOTE — Anesthesia Preprocedure Evaluation (Addendum)
Anesthesia Evaluation  Patient identified by MRN, date of birth, ID band Patient awake    Reviewed: Allergy & Precautions, H&P , NPO status , Patient's Chart, lab work & pertinent test results  History of Anesthesia Complications (+) DIFFICULT AIRWAY  Airway Mallampati: II  Neck ROM: Full    Dental  (+) Dental Advisory Given and Teeth Intact   Pulmonary  breath sounds clear to auscultation        Cardiovascular hypertension, Pt. on home beta blockers Rhythm:Regular Rate:Normal     Neuro/Psych  Headaches, PSYCHIATRIC DISORDERS Anxiety Blindness L eye, retinal detachment    GI/Hepatic negative GI ROS, Neg liver ROS,   Endo/Other  Morbid obesity  Renal/GU negative Renal ROS     Musculoskeletal negative musculoskeletal ROS (+)   Abdominal (+) + obese,   Peds  Hematology negative hematology ROS (+)   Anesthesia Other Findings   Reproductive/Obstetrics                         Anesthesia Physical Anesthesia Plan  ASA: III  Anesthesia Plan: MAC   Post-op Pain Management:    Induction: Intravenous  Airway Management Planned: Natural Airway and Simple Face Mask  Additional Equipment:   Intra-op Plan:   Post-operative Plan:   Informed Consent: I have reviewed the patients History and Physical, chart, labs and discussed the procedure including the risks, benefits and alternatives for the proposed anesthesia with the patient or authorized representative who has indicated his/her understanding and acceptance.   Dental advisory given  Plan Discussed with: CRNA, Surgeon and Anesthesiologist  Anesthesia Plan Comments:        Anesthesia Quick Evaluation

## 2012-03-12 NOTE — H&P (View-Only) (Signed)
Subjective:     Patient ID: Summer [name], female   DOB: 01/24/1956, 56 y.o.   MRN: [id number]  HPI this 56-year-old female was referred by Dr. Redding in the emergency department at  for possible temporal artery biopsy. This patient has chronic blindness in the left eye due to retinal detachment has chronic headaches. She has developed occasional numbness on the left side of the face recently. This is also been accompanied by the chronic headaches. Vision in her left eye is unchanged because she is legally blind in the left eye she states. She describes some chronic floaters in the right eye. She has no history of stroke. She was evaluated in the hospital and steroids were initiated. He was given IV Solu-Medrol 1 g daily for 3 days and started on 90 mg of prednisone per day. He has no history of connective tissue disorders such as lupus fibromyalgia or scleroderma. She was found to have an elevated sedimentation rate at 91 while in the hospital. Her symptoms occur intermittently but she states she has had no symptoms in the last 48 hours.  Past Medical History  Diagnosis Date  . Retinal detachment   . Hypertension   . Hyperlipemia     History  Substance Use Topics  . Smoking status: Never Smoker   . Smokeless tobacco: Never Used  . Alcohol Use: No    Family History  Problem Relation Age of Onset  . Heart disease Mother   . Heart disease Father   . Diabetes Father     No Known Allergies  Current outpatient prescriptions:cyclobenzaprine (FLEXERIL) 10 MG tablet, Take 10 mg by mouth 3 (three) times daily as needed., Disp: , Rfl: ;  lisinopril (PRINIVIL,ZESTRIL) 20 MG tablet, Take 20 mg by mouth daily., Disp: , Rfl: ;  methocarbamol (ROBAXIN) 500 MG tablet, Take 500 mg by mouth 3 (three) times daily., Disp: , Rfl:  metoprolol tartrate (LOPRESSOR) 25 MG tablet, Take 1 tablet (25 mg total) by mouth 2 (two) times daily., Disp: 60 tablet, Rfl: 0;  pravastatin (PRAVACHOL) 40 MG  tablet, Take 40 mg by mouth daily., Disp: , Rfl: ;  predniSONE (DELTASONE) 10 MG tablet, Take 9 tablets (90 mg total) by mouth daily with breakfast., Disp: 270 tablet, Rfl: 0  BP 103/68  Pulse 77  Resp 18  Ht 5' 5.5" (1.664 m)  Wt 231 lb (104.781 kg)  BMI 37.86 kg/m2  Body mass index is 37.86 kg/(m^2).           Review of Systems denies chest pain, dyspnea on exertion, PND, orthopnea, hemoptysis, chronic cough, asthma.     Objective:   Physical Exam blood pressure 103 were 68 heart rate 77 respirations 18 Gen.-alert and oriented x3 in no apparent distress HEENT blind in left eye-2+ superficial temporal pulse bilaterally. No tenderness in temporal area. Artery does not feel thickened to palpation. Lungs no rhonchi or wheezing Cardiovascular regular rhythm no murmurs carotid pulses 3+ palpable no bruits audible Abdomen soft nontender no palpable masses Musculoskeletal free of  major deformities Skin clear -no rashes Neurologic normal Lower extremities 3+ femoral and dorsalis pedis pulses palpable bilaterally with no edema      Assessment:     Chronic left-sided headaches and chronic blindness left eye with recent onset of intermittent numbness left face with elevated sed rate Emergency room physician has requested temporal artery biopsy to rule out temporal arteritis Doubt very seriously that this patient has temporal arteritis since her headaches and visual   loss or chronic and only new symptoms aren't numbness the left face  I have offered to perform left temporal artery biopsy outpatient and schedule this for Wednesday, January 22 at [facility] as outpatient. I also explained to patient the left leg in all likelihood this will be a negative biopsy-low index of suspicion     Plan:     Patient will make appointment with Dr. Wright her medical doctor and discuss this and they will decide whether to cancel temporal artery biopsy or to leave this on schedule for next  Wednesday or to make referral to rheumatologist. I plan to proceed with this temporal artery biopsy on the left side unless it is otherwise cancelled--- had a long discussion with patient and her sister and they understand this      

## 2012-03-12 NOTE — Transfer of Care (Signed)
Immediate Anesthesia Transfer of Care Note  Patient: Summer [name]  Procedure(s) Performed: Procedure(s) (LRB) with comments: BIOPSY TEMPORAL ARTERY (Left)  Patient Location: PACU  Anesthesia Type:MAC  Level of Consciousness: awake, alert  and oriented  Airway & Oxygen Therapy: Patient Spontanous Breathing  Post-op Assessment: Report given to PACU RN, Post -op Vital signs reviewed and stable and Patient moving all extremities X 4  Post vital signs: Reviewed and stable  Complications: No apparent anesthesia complications

## 2012-03-12 NOTE — OR Nursing (Signed)
Late entry @ 1036, Documented dressing as Dermabond, Sara Chu RN

## 2012-03-12 NOTE — Preoperative (Signed)
Beta Blockers   Reason not to administer Beta Blockers:Not Applicable, pt took 1/22

## 2012-03-12 NOTE — Anesthesia Procedure Notes (Signed)
Procedure Name: MAC Date/Time: 03/12/2012 8:40 AM Performed by: Elon Alas Pre-anesthesia Checklist: Patient identified, Timeout performed, Emergency Drugs available, Suction available and Patient being monitored Patient Re-evaluated:Patient Re-evaluated prior to inductionOxygen Delivery Method: Nasal cannula Placement Confirmation: positive ETCO2 and breath sounds checked- equal and bilateral Dental Injury: Teeth and Oropharynx as per pre-operative assessment

## 2012-03-12 NOTE — Interval H&P Note (Signed)
History and Physical Interval Note:  03/12/2012 8:36 AM  Summer [name]  has presented today for surgery, with the diagnosis of headache R/O Temporal Arteritis  The various methods of treatment have been discussed with the patient and family. After consideration of risks, benefits and other options for treatment, the patient has consented to  Procedure(s) (LRB) with comments: BIOPSY TEMPORAL ARTERY (Left) as a surgical intervention .  The patient's history has been reviewed, patient examined, no change in status, stable for surgery.  I have reviewed the patient's chart and labs.  Questions were answered to the patient's satisfaction.     Josephina Gip

## 2012-03-12 NOTE — Anesthesia Postprocedure Evaluation (Signed)
  Anesthesia Post-op Note  Patient: Summer [name]  Procedure(s) Performed: Procedure(s) (LRB) with comments: BIOPSY TEMPORAL ARTERY (Left)  Patient Location: PACU  Anesthesia Type:MAC  Level of Consciousness: awake  Airway and Oxygen Therapy: Patient Spontanous Breathing  Post-op Pain: none  Post-op Assessment: Post-op Vital signs reviewed  Post-op Vital Signs: stable  Complications: No apparent anesthesia complications

## 2012-03-14 ENCOUNTER — Encounter (HOSPITAL_COMMUNITY): Payer: Self-pay | Admitting: Vascular Surgery

## 2012-09-08 ENCOUNTER — Ambulatory Visit (INDEPENDENT_AMBULATORY_CARE_PROVIDER_SITE_OTHER): Payer: BC Managed Care – PPO | Admitting: Neurology

## 2012-09-08 ENCOUNTER — Encounter: Payer: Self-pay | Admitting: Neurology

## 2012-09-08 ENCOUNTER — Other Ambulatory Visit: Payer: Self-pay | Admitting: Neurology

## 2012-09-08 VITALS — BP 133/76 | HR 86 | Ht 66.0 in | Wt 222.0 lb

## 2012-09-08 DIAGNOSIS — R51 Headache: Secondary | ICD-10-CM

## 2012-09-08 DIAGNOSIS — G501 Atypical facial pain: Secondary | ICD-10-CM

## 2012-09-08 DIAGNOSIS — R209 Unspecified disturbances of skin sensation: Secondary | ICD-10-CM

## 2012-09-08 HISTORY — DX: Atypical facial pain: G50.1

## 2012-09-08 MED ORDER — GABAPENTIN 100 MG PO CAPS: ORAL_CAPSULE | ORAL | Status: DC
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
## 2012-09-09 LAB — SEDIMENTATION RATE: Sed Rate: 15 mm/hr (ref 0–40)

## 2012-09-09 LAB — COMPREHENSIVE METABOLIC PANEL
ALT: 12 IU/L (ref 0–32)
Albumin/Globulin Ratio: 1 — ABNORMAL LOW (ref 1.1–2.5)
Albumin: 3.6 g/dL (ref 3.5–5.5)
Alkaline Phosphatase: 114 IU/L (ref 39–117)
BUN/Creatinine Ratio: 10 (ref 9–23)
Chloride: 103 mmol/L (ref 97–108)
GFR calc Af Amer: 69 mL/min/{1.73_m2} (ref >59)
GFR calc non Af Amer: 60 mL/min/{1.73_m2} (ref >59)
Potassium: 4.5 mmol/L (ref 3.5–5.2)
Total Bilirubin: 0.5 mg/dL (ref 0.0–1.2)

## 2012-09-09 LAB — ANGIOTENSIN CONVERTING ENZYME: Angio Convert Enzyme: <14 U/L — ABNORMAL LOW (ref 14–82)

## 2012-09-09 LAB — RHEUMATOID FACTOR: Rheumatoid fact SerPl-aCnc: 7.3 [IU]/mL (ref 0.0–13.9)

## 2012-09-11 ENCOUNTER — Ambulatory Visit (INDEPENDENT_AMBULATORY_CARE_PROVIDER_SITE_OTHER): Payer: Medicaid Other

## 2012-09-11 DIAGNOSIS — R51 Headache: Secondary | ICD-10-CM

## 2012-09-11 DIAGNOSIS — G501 Atypical facial pain: Secondary | ICD-10-CM

## 2012-09-11 DIAGNOSIS — R209 Unspecified disturbances of skin sensation: Secondary | ICD-10-CM

## 2012-09-15 NOTE — Progress Notes (Signed)
Quick Note:  Spoke with patient and relayed results of blood work. Patient understood and had no questions.  ______ 

## 2012-09-16 ENCOUNTER — Other Ambulatory Visit: Payer: Self-pay

## 2012-09-16 ENCOUNTER — Ambulatory Visit
Admission: RE | Admit: 2012-09-16 | Discharge: 2012-09-16 | Disposition: A | Discharge status: Home / Self Care | Payer: Medicaid Other | Source: Ambulatory Visit | Attending: Neurology | Admitting: Neurology

## 2012-09-16 ENCOUNTER — Telehealth: Payer: Self-pay | Admitting: Neurology

## 2012-09-16 DIAGNOSIS — G501 Atypical facial pain: Secondary | ICD-10-CM

## 2012-09-16 DIAGNOSIS — R209 Unspecified disturbances of skin sensation: Secondary | ICD-10-CM

## 2012-09-16 DIAGNOSIS — R51 Headache: Secondary | ICD-10-CM

## 2012-09-16 MED ORDER — GADOBENATE DIMEGLUMINE 529 MG/ML IV SOLN: 20.0000 mL | Freq: Once | INTRAVENOUS | Status: AC | PRN

## 2012-09-16 NOTE — Telephone Encounter (Signed)
I called patient. The carotid Doppler study is unremarkable. MRI of the brain shows scattered white matter lesions consistent with small vessel disease, extent of this is minimal. MRA by my reading appears to be unremarkable. The formal report of the MRI and MRI exam are not available to me. I discussed the results with the patient, but if the formal reading is different from this, I'll call the patient back.

## 2012-09-19 ENCOUNTER — Other Ambulatory Visit: Payer: Self-pay

## 2012-10-22 ENCOUNTER — Other Ambulatory Visit: Payer: Self-pay | Admitting: Gastroenterology

## 2012-12-15 ENCOUNTER — Encounter (HOSPITAL_COMMUNITY): Payer: Self-pay | Admitting: Emergency Medicine

## 2012-12-15 ENCOUNTER — Emergency Department (HOSPITAL_COMMUNITY): Payer: BC Managed Care – PPO

## 2012-12-15 ENCOUNTER — Observation Stay (HOSPITAL_COMMUNITY)
Admission: EM | Admit: 2012-12-15 | Discharge: 2012-12-16 | Disposition: A | Discharge status: Home / Self Care | Payer: BC Managed Care – PPO | Attending: Internal Medicine | Admitting: Internal Medicine

## 2012-12-15 DIAGNOSIS — Z87311 Personal history of (healed) other pathological fracture: Secondary | ICD-10-CM | POA: Insufficient documentation

## 2012-12-15 DIAGNOSIS — Z79899 Other long term (current) drug therapy: Secondary | ICD-10-CM | POA: Insufficient documentation

## 2012-12-15 DIAGNOSIS — E785 Hyperlipidemia, unspecified: Secondary | ICD-10-CM | POA: Diagnosis present

## 2012-12-15 DIAGNOSIS — R51 Headache: Secondary | ICD-10-CM

## 2012-12-15 DIAGNOSIS — Z6839 Body mass index (BMI) 39.0-39.9, adult: Secondary | ICD-10-CM | POA: Insufficient documentation

## 2012-12-15 DIAGNOSIS — E669 Obesity, unspecified: Secondary | ICD-10-CM | POA: Diagnosis present

## 2012-12-15 DIAGNOSIS — I10 Essential (primary) hypertension: Secondary | ICD-10-CM | POA: Diagnosis present

## 2012-12-15 DIAGNOSIS — R209 Unspecified disturbances of skin sensation: Secondary | ICD-10-CM

## 2012-12-15 DIAGNOSIS — R079 Chest pain, unspecified: Secondary | ICD-10-CM

## 2012-12-15 DIAGNOSIS — G501 Atypical facial pain: Secondary | ICD-10-CM

## 2012-12-15 DIAGNOSIS — R000 Tachycardia, unspecified: Secondary | ICD-10-CM

## 2012-12-15 DIAGNOSIS — R9431 Abnormal electrocardiogram [ECG] [EKG]: Secondary | ICD-10-CM | POA: Diagnosis present

## 2012-12-15 DIAGNOSIS — F3289 Other specified depressive episodes: Secondary | ICD-10-CM | POA: Insufficient documentation

## 2012-12-15 DIAGNOSIS — F329 Major depressive disorder, single episode, unspecified: Secondary | ICD-10-CM | POA: Insufficient documentation

## 2012-12-15 DIAGNOSIS — F411 Generalized anxiety disorder: Secondary | ICD-10-CM | POA: Insufficient documentation

## 2012-12-15 DIAGNOSIS — R002 Palpitations: Principal | ICD-10-CM | POA: Diagnosis present

## 2012-12-15 LAB — BASIC METABOLIC PANEL
BUN: 12 mg/dL (ref 6–23)
CO2: 25 mEq/L (ref 19–32)
Calcium: 9.7 mg/dL (ref 8.4–10.5)
Chloride: 101 mEq/L (ref 96–112)
Creatinine, Ser: 1.01 mg/dL (ref 0.50–1.10)
Glucose, Bld: 119 mg/dL — ABNORMAL HIGH (ref 70–99)

## 2012-12-15 LAB — CREATININE, SERUM
Creatinine, Ser: 0.94 mg/dL (ref 0.50–1.10)
GFR calc Af Amer: 78 mL/min — ABNORMAL LOW (ref >90)

## 2012-12-15 LAB — CK TOTAL AND CKMB (NOT AT ARMC)
CK, MB: 2.1 ng/mL (ref 0.3–4.0)
CK, MB: 2 ng/mL (ref 0.3–4.0)
Total CK: 94 U/L (ref 7–177)
Total CK: 106 U/L (ref 7–177)

## 2012-12-15 LAB — CBC WITH DIFFERENTIAL/PLATELET
Basophils Absolute: 0 10*3/uL (ref 0.0–0.1)
Eosinophils Relative: 2 % (ref 0–5)
HCT: 39.9 % (ref 36.0–46.0)
Hemoglobin: 13.3 g/dL (ref 12.0–15.0)
Lymphocytes Relative: 28 % (ref 12–46)
MCV: 86.7 fL (ref 78.0–100.0)
Monocytes Absolute: 0.4 10*3/uL (ref 0.1–1.0)
Monocytes Relative: 6 % (ref 3–12)
Neutro Abs: 4.1 10*3/uL (ref 1.7–7.7)
RDW: 13.2 % (ref 11.5–15.5)
WBC: 6.4 10*3/uL (ref 4.0–10.5)

## 2012-12-15 LAB — CBC
MCH: 29.1 pg (ref 26.0–34.0)
MCHC: 33.6 g/dL (ref 30.0–36.0)
MCV: 86.5 fL (ref 78.0–100.0)
Platelets: 212 10*3/uL (ref 150–400)
RDW: 13.2 % (ref 11.5–15.5)
WBC: 5.8 10*3/uL (ref 4.0–10.5)

## 2012-12-15 MED ORDER — LISINOPRIL 20 MG PO TABS
20.0000 mg | ORAL_TABLET | Freq: Every day | ORAL | Status: DC
  Administered 2012-12-16: 20 mg via ORAL
  Filled 2012-12-15: qty 1

## 2012-12-15 MED ORDER — CYCLOBENZAPRINE HCL 10 MG PO TABS: 10.0000 mg | ORAL_TABLET | Freq: Three times a day (TID) | ORAL | Status: DC | PRN

## 2012-12-15 MED ORDER — HYDROMORPHONE HCL PF 1 MG/ML IJ SOLN: 1.0000 mg | INTRAMUSCULAR | Status: DC | PRN

## 2012-12-15 MED ORDER — SODIUM CHLORIDE 0.9 % IV BOLUS (SEPSIS)
1000.0000 mL | Freq: Once | INTRAVENOUS | Status: AC
  Administered 2012-12-15: 1000 mL via INTRAVENOUS

## 2012-12-15 MED ORDER — ACETAMINOPHEN 500 MG PO TABS: 1000.0000 mg | ORAL_TABLET | ORAL | Status: DC | PRN

## 2012-12-15 MED ORDER — REGADENOSON 0.4 MG/5ML IV SOLN
0.4000 mg | Freq: Once | INTRAVENOUS | Status: AC
  Administered 2012-12-16: 0.4 mg via INTRAVENOUS
  Filled 2012-12-15: qty 5

## 2012-12-15 MED ORDER — ACETAMINOPHEN 325 MG PO TABS: 650.0000 mg | ORAL_TABLET | Freq: Four times a day (QID) | ORAL | Status: DC | PRN

## 2012-12-15 MED ORDER — LORAZEPAM 1 MG PO TABS: 1.0000 mg | ORAL_TABLET | Freq: Three times a day (TID) | ORAL | Status: DC | PRN

## 2012-12-15 MED ORDER — ONDANSETRON HCL 4 MG/2ML IJ SOLN: 4.0000 mg | Freq: Four times a day (QID) | INTRAMUSCULAR | Status: DC | PRN

## 2012-12-15 MED ORDER — SIMVASTATIN 20 MG PO TABS
20.0000 mg | ORAL_TABLET | Freq: Every day | ORAL | Status: DC
  Administered 2012-12-15: 20 mg via ORAL
  Filled 2012-12-15 (×2): qty 1

## 2012-12-15 MED ORDER — HYDROCODONE-ACETAMINOPHEN 5-325 MG PO TABS: 1.0000 | ORAL_TABLET | ORAL | Status: DC | PRN

## 2012-12-15 MED ORDER — ASPIRIN 325 MG PO TABS: 325.0000 mg | ORAL_TABLET | Freq: Once | ORAL | Status: DC

## 2012-12-15 MED ORDER — SODIUM CHLORIDE 0.9 % IJ SOLN: 3.0000 mL | Freq: Two times a day (BID) | INTRAMUSCULAR | Status: DC

## 2012-12-15 MED ORDER — ONDANSETRON HCL 4 MG PO TABS: 4.0000 mg | ORAL_TABLET | Freq: Four times a day (QID) | ORAL | Status: DC | PRN

## 2012-12-15 MED ORDER — ACETAMINOPHEN 650 MG RE SUPP: 650.0000 mg | Freq: Four times a day (QID) | RECTAL | Status: DC | PRN

## 2012-12-15 MED ORDER — METOPROLOL TARTRATE 25 MG PO TABS
25.0000 mg | ORAL_TABLET | Freq: Two times a day (BID) | ORAL | Status: DC
  Administered 2012-12-15 – 2012-12-16 (×2): 25 mg via ORAL
  Filled 2012-12-15 (×3): qty 1

## 2012-12-15 MED ORDER — ENOXAPARIN SODIUM 40 MG/0.4ML ~~LOC~~ SOLN
40.0000 mg | SUBCUTANEOUS | Status: DC
  Administered 2012-12-15: 40 mg via SUBCUTANEOUS
  Filled 2012-12-15 (×2): qty 0.4

## 2012-12-15 MED ORDER — GABAPENTIN 100 MG PO CAPS
200.0000 mg | ORAL_CAPSULE | Freq: Three times a day (TID) | ORAL | Status: DC
  Administered 2012-12-15 – 2012-12-16 (×3): 200 mg via ORAL
  Filled 2012-12-15 (×5): qty 2

## 2012-12-15 MED ORDER — SODIUM CHLORIDE 0.9 % IV SOLN
INTRAVENOUS | Status: DC
  Administered 2012-12-15 – 2012-12-16 (×2): via INTRAVENOUS
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
## 2012-12-15 NOTE — ED Notes (Addendum)
Pt states she has hx of anxiety.  Pt states when she woke this morning at 0600 she felt a "fluttery" feeling in her chest.  Pt states she thought is was just her anxiety because she has a "nervous feeling".  Pt presented to her PCP for her scheduled yearly physical.  PCP felt that there were slight changes to her EKG and sent her here via [facility] for further evaluation.  Pt denies CP, SOB.  Per EMS, pt given 324mg  Aspirin prior to EMS transfer.
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
## 2012-12-16 ENCOUNTER — Inpatient Hospital Stay (HOSPITAL_COMMUNITY): Payer: BC Managed Care – PPO

## 2012-12-16 DIAGNOSIS — E669 Obesity, unspecified: Secondary | ICD-10-CM | POA: Diagnosis present

## 2012-12-16 DIAGNOSIS — R000 Tachycardia, unspecified: Secondary | ICD-10-CM

## 2012-12-16 DIAGNOSIS — R079 Chest pain, unspecified: Secondary | ICD-10-CM

## 2012-12-16 DIAGNOSIS — R9431 Abnormal electrocardiogram [ECG] [EKG]: Secondary | ICD-10-CM

## 2012-12-16 HISTORY — PX: NM MYOVIEW LTD: HXRAD82

## 2012-12-16 LAB — BASIC METABOLIC PANEL
BUN: 11 mg/dL (ref 6–23)
Calcium: 9.3 mg/dL (ref 8.4–10.5)
Chloride: 106 mEq/L (ref 96–112)
Creatinine, Ser: 0.94 mg/dL (ref 0.50–1.10)
GFR calc non Af Amer: 67 mL/min — ABNORMAL LOW (ref >90)
Glucose, Bld: 108 mg/dL — ABNORMAL HIGH (ref 70–99)
Sodium: 140 mEq/L (ref 135–145)

## 2012-12-16 LAB — CBC
HCT: 34.5 % — ABNORMAL LOW (ref 36.0–46.0)
MCH: 28.8 pg (ref 26.0–34.0)
MCV: 86.3 fL (ref 78.0–100.0)
Platelets: 232 10*3/uL (ref 150–400)
RBC: 4 MIL/uL (ref 3.87–5.11)
RDW: 13.2 % (ref 11.5–15.5)

## 2012-12-16 LAB — LIPID PANEL
HDL: 46 mg/dL (ref >39)
LDL Cholesterol: 86 mg/dL (ref 0–99)
Total CHOL/HDL Ratio: 3.2 RATIO
Triglycerides: 83 mg/dL (ref <150)

## 2012-12-16 LAB — TROPONIN I: Troponin I: <0.3 ng/mL (ref <0.30)

## 2012-12-16 MED ORDER — CYCLOBENZAPRINE HCL 10 MG PO TABS: 10.0000 mg | ORAL_TABLET | Freq: Three times a day (TID) | ORAL | Status: DC | PRN

## 2012-12-16 MED ORDER — METOPROLOL SUCCINATE ER 50 MG PO TB24: 50.0000 mg | ORAL_TABLET | Freq: Every day | ORAL | Status: DC

## 2012-12-16 MED ORDER — ASPIRIN EC 81 MG PO TBEC: 81.0000 mg | DELAYED_RELEASE_TABLET | Freq: Every day | ORAL | Status: DC

## 2012-12-16 MED ORDER — TECHNETIUM TC 99M SESTAMIBI GENERIC - CARDIOLITE
10.0000 | Freq: Once | INTRAVENOUS | Status: AC | PRN
  Administered 2012-12-16: 10 via INTRAVENOUS

## 2012-12-16 MED ORDER — GABAPENTIN 300 MG PO CAPS: 300.0000 mg | ORAL_CAPSULE | Freq: Three times a day (TID) | ORAL | Status: DC

## 2012-12-16 MED ORDER — REGADENOSON 0.4 MG/5ML IV SOLN
INTRAVENOUS | Status: AC
  Administered 2012-12-16: 0.4 mg via INTRAVENOUS
  Filled 2012-12-16: qty 5

## 2012-12-16 MED ORDER — METHOCARBAMOL 500 MG PO TABS: 500.0000 mg | ORAL_TABLET | Freq: Three times a day (TID) | ORAL | Status: DC

## 2012-12-16 MED ORDER — LISINOPRIL 20 MG PO TABS: 20.0000 mg | ORAL_TABLET | Freq: Every day | ORAL | Status: DC

## 2012-12-16 MED ORDER — TECHNETIUM TC 99M SESTAMIBI GENERIC - CARDIOLITE
30.0000 | Freq: Once | INTRAVENOUS | Status: AC | PRN
  Administered 2012-12-16: 30 via INTRAVENOUS

## 2012-12-16 MED ORDER — PRAVASTATIN SODIUM 40 MG PO TABS: 40.0000 mg | ORAL_TABLET | Freq: Every day | ORAL | Status: DC

## 2012-12-16 NOTE — Progress Notes (Signed)
Subjective:  No further palpitations, no chest apin.  Objective:  Vital Signs in the last 24 hours: Temp:  [97.7 F (36.5 C)-99.1 F (37.3 C)] 98 F (36.7 C) (10/28 0533) Pulse Rate:  [82-94] 94 (10/28 0533) Resp:  [19-22] 20 (10/28 0533) BP: (114-162)/(60-79) 114/75 mmHg (10/28 0533) SpO2:  [98 %-100 %] 100 % (10/28 0533) Weight:  [239 lb 6.4 oz (108.591 kg)] 239 lb 6.4 oz (108.591 kg) (10/27 2009)  Intake/Output from previous day:  Intake/Output Summary (Last 24 hours) at 12/16/12 1011 Last data filed at 12/16/12 0525  Gross per 24 hour  Intake   1155 ml  Output    652 ml  Net    503 ml    Physical Exam: General appearance: alert, cooperative and no distress Lungs: clear to auscultation bilaterally Heart: regular rate and rhythm   Rate: 94  Rhythm: normal sinus rhythm  Lab Results:  Recent Labs  12/15/12 1845 12/16/12 0429  WBC 5.8 4.5  HGB 11.6* 11.5*  PLT 212 232    Recent Labs  12/15/12 1155 12/15/12 1845 12/16/12 0429  NA 136  --  140  K 4.1  --  4.6  CL 101  --  106  CO2 25  --  24  GLUCOSE 119*  --  108*  BUN 12  --  11  CREATININE 1.01 0.94 0.94    Recent Labs  12/15/12 2230 12/16/12 0429  TROPONINI <0.30 <0.30   No results found for this basename: INR,  in the last 72 hours  Imaging: Imaging results have been reviewed  Cardiac Studies:  Assessment/Plan:   Principal Problem:   Palpitations Active Problems:   Abnormal EKG   Hypertension   Hyperlipidemia   Obesity (BMI 30-39.9)    PLAN: Myoview.  Corine Shelter PA-C Beeper [phone] 12/16/2012, 10:11 AM

## 2012-12-16 NOTE — Progress Notes (Signed)
Nutrition Brief Note  Patient identified on the Malnutrition Screening Tool (MST) Report for recent weight lost without trying (patient unsure).  Per readings below, patient's weight has trended up.  Wt Readings from Last 15 Encounters:  12/15/12 239 lb 6.4 oz (108.591 kg)  09/08/12 222 lb (100.699 kg)  03/10/12 235 lb 0.2 oz (106.6 kg)  03/04/12 231 lb (104.781 kg)  03/03/12 227 lb 3.2 oz (103.057 kg)    Body mass index is 39.84 kg/(m^2). Patient meets criteria for Obesity Class II based on current BMI.   Current diet order is Heart Healthy, patient is consuming approximately 75% of meals at this time. Labs and medications reviewed.   No nutrition interventions warranted at this time. If nutrition issues arise, please consult RD.   Maureen Chatters, RD, LDN Pager #: [phone] After-Hours Pager #: [phone]

## 2012-12-16 NOTE — Progress Notes (Signed)
Discharge instructions along with med list provided. IV d/c'd with catheter intact. Awaiting transport to lobby.Mamie Levers
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
## 2012-12-16 NOTE — Progress Notes (Addendum)
Pt. Seen and examined. Agree with the NP/PA-C note as written.  My preliminary review of the actual stress test images does not show any significant ischemia - there is a subtle anteroapical defect which I suspect is breast attenuation and/or motion artifact. I suspect she could be discharged today with an outpatient monitor on increased dose b-blocker, ASA and statin. She has had no further chest pain. Follow-up with MLP or Dr. Herbie Baltimore after the monitor results are obtained.  Chrystie Nose, MD, [facility] Board Certified in Nuclear Cardiology Attending Cardiologist [facility]

## 2012-12-26 ENCOUNTER — Encounter: Payer: Self-pay | Admitting: Cardiology

## 2012-12-26 ENCOUNTER — Ambulatory Visit (INDEPENDENT_AMBULATORY_CARE_PROVIDER_SITE_OTHER): Payer: BC Managed Care – PPO | Admitting: Cardiology

## 2012-12-26 VITALS — BP 110/70 | HR 104 | Ht 65.0 in | Wt 235.5 lb

## 2012-12-26 DIAGNOSIS — R002 Palpitations: Secondary | ICD-10-CM

## 2012-12-26 DIAGNOSIS — R000 Tachycardia, unspecified: Secondary | ICD-10-CM

## 2012-12-26 DIAGNOSIS — I10 Essential (primary) hypertension: Secondary | ICD-10-CM

## 2012-12-26 DIAGNOSIS — E669 Obesity, unspecified: Secondary | ICD-10-CM

## 2012-12-26 DIAGNOSIS — R9431 Abnormal electrocardiogram [ECG] [EKG]: Secondary | ICD-10-CM

## 2012-12-26 MED ORDER — METOPROLOL SUCCINATE ER 50 MG PO TB24: ORAL_TABLET | ORAL | Status: DC

## 2012-12-26 NOTE — Patient Instructions (Signed)
So lets increase your Metoprolol to 1.5 tab daily with Breakfast.   Then take your Lisinopril in the evenings.  Use the 1/2 tablet as an As Needed Pill for worsening palpitations.  Marykay Lex, MD
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
## 2012-12-28 ENCOUNTER — Encounter: Payer: Self-pay | Admitting: Cardiology

## 2012-12-28 NOTE — Assessment & Plan Note (Signed)
She herself notes that she is well overweight, and is really trying to figure out ways to lose weight. She wants to try to get active exercising, but her vision issues make it very difficult. She has asked if she can be referred for nutritional consultation. I have given her name to Karenann Cai, RDN, CDE for consultation. She looks forward to this.

## 2012-12-28 NOTE — Assessment & Plan Note (Signed)
Pretty well-controlled. She is on ACE inhibitor plus beta blocker. I've asked her to continue taking the beta blocker in the morning, but switched lisinopril to p.m.

## 2012-12-28 NOTE — Assessment & Plan Note (Signed)
With a relatively benign Myoview, her ECG is not overtly abnormal. Which continue to monitor it however if she were to have episodes of chest discomfort but don't know how affective the stress test will be given the presence of possible breast attenuation.

## 2012-12-28 NOTE — Assessment & Plan Note (Signed)
Not really sure what the episode she had was. It sounds more to panic attack. She does have resting sinus tachycardia. Abdomen increase her metoprolol to 75 mg daily. I want her to use the additional 25 mg a when necessary dose, if she was to have one of these episodes.

## 2012-12-28 NOTE — Assessment & Plan Note (Signed)
Nothing noted on overnight monitor. Unless she has more episodes, he'll benefit from having her wear a 30 day monitor. Continue beta blocker and monitor her symptoms.

## 2013-05-08 ENCOUNTER — Encounter: Payer: Self-pay | Admitting: Neurology

## 2013-05-08 ENCOUNTER — Encounter (INDEPENDENT_AMBULATORY_CARE_PROVIDER_SITE_OTHER): Payer: Self-pay

## 2013-05-08 ENCOUNTER — Ambulatory Visit (INDEPENDENT_AMBULATORY_CARE_PROVIDER_SITE_OTHER): Payer: BC Managed Care – PPO | Admitting: Neurology

## 2013-05-08 VITALS — BP 136/72 | HR 72 | Wt 237.0 lb

## 2013-05-08 DIAGNOSIS — G501 Atypical facial pain: Secondary | ICD-10-CM

## 2013-05-08 DIAGNOSIS — R209 Unspecified disturbances of skin sensation: Secondary | ICD-10-CM

## 2013-05-08 NOTE — Patient Instructions (Signed)
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
## 2013-06-10 ENCOUNTER — Telehealth: Payer: Self-pay | Admitting: Neurology

## 2013-06-10 MED ORDER — GABAPENTIN (ONCE-DAILY) 600 MG PO TABS: 1800.0000 mg | ORAL_TABLET | Freq: Every day | ORAL | Status: DC

## 2013-06-10 NOTE — Telephone Encounter (Signed)
I spoke with the patient.  Said she is up to 1200mg  of Gralise daily.  States it is beneficial for her at night, however, she is still having pain during the day.  She would like to know if perhaps the dose needs to be increased further or if something else should be tried for daytime pain.  Please advise.  Thank you.

## 2013-06-10 NOTE — Telephone Encounter (Signed)
I called the patient. The patient is getting benefit with the Gralise taking 1200 mg in the evening. The patient is having some pain during the day. We will go up to 1800 mg daily. If this does not work out, the patient may take short acting gabapentin during the daytime.

## 2013-06-10 NOTE — Telephone Encounter (Signed)
Patient calling to state that she was given samples of Gabapentin and would like a script for it to be called in to her pharmacy.

## 2013-06-19 ENCOUNTER — Telehealth: Payer: Self-pay | Admitting: *Deleted

## 2013-06-19 NOTE — Telephone Encounter (Signed)
The patient has a history of periorbital headaches. The headaches have been slightly worse over the last 2 days. The patient wants to know whether she can take Tylenol with the Gralise. There are no contraindications to this.

## 2013-06-19 NOTE — Telephone Encounter (Signed)
Patient calling wanting to know if she could change the time she's taken her medication (Gabapentin, PHN, (GRALISE) 600 MG TABS).  She's experiencing severe pain on the L side of her head.  She normally takes meds between 5:30 or 6 pm..Please advise.  Thanks

## 2013-08-10 ENCOUNTER — Telehealth: Payer: Self-pay | Admitting: Neurology

## 2013-08-10 MED ORDER — CARBAMAZEPINE 200 MG PO TABS: ORAL_TABLET | ORAL | Status: DC

## 2013-08-10 NOTE — Telephone Encounter (Signed)
I called patient. She could not tolerate the Gralise secondary to balance issues. She will go down to 600 mg daily of Gralise for one week, then stop the medication. I will start low-dose carbamazepine. This can cause dizziness and gait problems 2, but this is usually a transient problem, not ongoing.

## 2013-08-10 NOTE — Telephone Encounter (Signed)
I called back and spoke with the patient.  She said she had been taking Gralise 1800mg , but began having issues with balance.  Her other physician recommended she decrease the dose back to 1200mg .  Says she has been on 1200mg  for about two weeks, but still is feeling balance issues.  She would like to know if med should be changed.  Please advise.  Thank you.

## 2013-08-10 NOTE — Telephone Encounter (Signed)
Patient experiencing losing balance and leg paint while taking Gabapentin, PHN, (GRALISE) 600 MG TABS.  PCP changed dosage to 1200 mg due to balance issues.  Wanted to make Summer [name] and aware and see if there's something else she could take.  Please call and advise.

## 2013-09-08 ENCOUNTER — Ambulatory Visit: Payer: Self-pay | Admitting: Adult Health

## 2013-10-12 ENCOUNTER — Ambulatory Visit (INDEPENDENT_AMBULATORY_CARE_PROVIDER_SITE_OTHER): Payer: Commercial Managed Care - HMO | Admitting: Adult Health

## 2013-10-12 ENCOUNTER — Encounter: Payer: Self-pay | Admitting: Adult Health

## 2013-10-12 VITALS — BP 119/68 | HR 85 | Ht 65.5 in | Wt 244.0 lb

## 2013-10-12 DIAGNOSIS — Z5181 Encounter for therapeutic drug level monitoring: Secondary | ICD-10-CM

## 2013-10-12 DIAGNOSIS — R209 Unspecified disturbances of skin sensation: Secondary | ICD-10-CM | POA: Diagnosis not present

## 2013-10-12 DIAGNOSIS — H547 Unspecified visual loss: Secondary | ICD-10-CM | POA: Diagnosis not present

## 2013-10-12 DIAGNOSIS — R000 Tachycardia, unspecified: Secondary | ICD-10-CM

## 2013-10-12 DIAGNOSIS — G501 Atypical facial pain: Secondary | ICD-10-CM | POA: Diagnosis not present

## 2013-10-12 LAB — CBC WITH DIFFERENTIAL
Basophils Absolute: 0 10*3/uL (ref 0.0–0.2)
Basos: 0 %
EOS ABS: 0.2 10*3/uL (ref 0.0–0.4)
Eos: 3 %
HCT: 35.4 % (ref 34.0–46.6)
Hemoglobin: 12.1 g/dL (ref 11.1–15.9)
Lymphocytes Absolute: 2 10*3/uL (ref 0.7–3.1)
Lymphs: 33 %
MCH: 28.5 pg (ref 26.6–33.0)
MCHC: 34.2 g/dL (ref 31.5–35.7)
MCV: 83 fL (ref 79–97)
MONOS ABS: 0.5 10*3/uL (ref 0.1–0.9)
Monocytes: 9 %
NEUTROS PCT: 55 %
Neutrophils Absolute: 3.4 10*3/uL (ref 1.4–7.0)
PLATELETS: 246 10*3/uL (ref 150–379)
RBC: 4.25 x10E6/uL (ref 3.77–5.28)
RDW: 13.6 % (ref 12.3–15.4)
WBC: 6.1 10*3/uL (ref 3.4–10.8)

## 2013-10-12 LAB — COMPREHENSIVE METABOLIC PANEL
ALT: 17 IU/L (ref 0–32)
AST: 20 IU/L (ref 0–40)
Albumin/Globulin Ratio: 1.1 (ref 1.1–2.5)
Albumin: 4 g/dL (ref 3.5–5.5)
Alkaline Phosphatase: 121 IU/L — ABNORMAL HIGH (ref 39–117)
BUN/Creatinine Ratio: 13 (ref 9–23)
BUN: 15 mg/dL (ref 6–24)
CALCIUM: 9 mg/dL (ref 8.7–10.2)
CO2: 23 mmol/L (ref 18–29)
Chloride: 101 mmol/L (ref 96–108)
Creatinine, Ser: 1.17 mg/dL — ABNORMAL HIGH (ref 0.57–1.00)
GFR calc Af Amer: 61 mL/min/{1.73_m2} (ref >59)
GFR calc non Af Amer: 53 mL/min/{1.73_m2} — ABNORMAL LOW (ref >59)
GLUCOSE: 97 mg/dL (ref 65–99)
Globulin, Total: 3.8 g/dL (ref 1.5–4.5)
POTASSIUM: 4.3 mmol/L (ref 3.5–5.2)
SODIUM: 138 mmol/L (ref 134–144)
TOTAL PROTEIN: 7.8 g/dL (ref 6.0–8.5)

## 2013-10-12 LAB — CARBAMAZEPINE LEVEL, TOTAL: CARBAMAZEPINE LVL: 7.3 ug/mL (ref 4.0–12.0)

## 2013-10-12 MED ORDER — CARBAMAZEPINE 200 MG PO TABS: ORAL_TABLET | ORAL | Status: DC

## 2013-10-12 NOTE — Patient Instructions (Signed)
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
## 2013-10-12 NOTE — Progress Notes (Signed)
I have read the note, and I agree with the clinical assessment and plan.  WILLIS,CHARLES KEITH   

## 2013-10-13 NOTE — Progress Notes (Signed)
Quick Note:  Spoke with patient and relayed message that overall labs were good with elevated Alkaline phosphate, Creatinine, with a decrease in GFR, patient verbalized understanding, instructed patient to call back with any questions or concerns. ______

## 2014-01-13 ENCOUNTER — Encounter: Payer: Self-pay | Admitting: Adult Health

## 2014-01-13 ENCOUNTER — Ambulatory Visit (INDEPENDENT_AMBULATORY_CARE_PROVIDER_SITE_OTHER): Payer: Commercial Managed Care - HMO | Admitting: Adult Health

## 2014-01-13 VITALS — BP 110/73 | HR 70 | Temp 97.6°F | Ht 65.5 in | Wt 241.0 lb

## 2014-01-13 DIAGNOSIS — G501 Atypical facial pain: Secondary | ICD-10-CM

## 2014-01-13 DIAGNOSIS — H547 Unspecified visual loss: Secondary | ICD-10-CM

## 2014-01-13 MED ORDER — CARBAMAZEPINE 200 MG PO TABS: ORAL_TABLET | ORAL | Status: DC

## 2014-01-13 NOTE — Progress Notes (Signed)
I have read the note, and I agree with the clinical assessment and plan.  Brealyn Baril KEITH   
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
## 2014-01-13 NOTE — Patient Instructions (Signed)
Trigeminal Neuralgia  Trigeminal neuralgia is a nerve disorder that causes sudden attacks of severe facial pain. It is caused by damage to the trigeminal nerve, a major nerve in the face. It is more common in women and in the elderly, although it can also happen in younger patients. Attacks last from a few seconds to several minutes and can occur from a couple of times per year to several times per day. Trigeminal neuralgia can be a very distressing and disabling condition. Surgery may be needed in very severe cases if medical treatment does not give relief.  HOME CARE INSTRUCTIONS    If your caregiver prescribed medication to help prevent attacks, take as directed.   To help prevent attacks:   Chew on the unaffected side of the mouth.   Avoid touching your face.   Avoid blasts of hot or cold air.   Men may wish to grow a beard to avoid having to shave.  SEEK IMMEDIATE MEDICAL CARE IF:   Pain is unbearable and your medicine does not help.   You develop new, unexplained symptoms (problems).   You have problems that may be related to a medication you are taking.  Document Released: 02/03/2000 Document Revised: 04/30/2011 Document Reviewed: 12/03/2008  [employer] Patient Information 2015 [employer]. This information is not intended to replace advice given to you by your health care provider. Make sure you discuss any questions you have with your health care provider.

## 2014-01-22 ENCOUNTER — Telehealth: Payer: Self-pay | Admitting: Adult Health

## 2014-01-22 NOTE — Telephone Encounter (Signed)
WID- Patient stated Rx carbamazepine (TEGRETOL) 200 MG tablet not helping with pain.  Questioning if she could get dosage increase?  Please call and advise.

## 2014-01-23 MED ORDER — CARBAMAZEPINE 200 MG PO TABS: 400.0000 mg | ORAL_TABLET | Freq: Two times a day (BID) | ORAL | Status: DC

## 2014-01-23 NOTE — Telephone Encounter (Signed)
I called the patient. The patient is having increased pain in the face. The carbamazepine is not helping at this point. She is on the 200 mg tablets, taking a tablet and a half in the morning, 2 tablets in the evening. The last blood levels in the summer were 7.3. The patient does have room to increase the medication, she will go to 400 mg twice daily. If this is not effective after 10 days, the patient is to contact our office, and we may add Cymbalta to this dosing.

## 2014-03-22 DIAGNOSIS — R51 Headache: Secondary | ICD-10-CM | POA: Diagnosis not present

## 2014-03-23 ENCOUNTER — Telehealth: Payer: Self-pay | Admitting: *Deleted

## 2014-03-23 NOTE — Telephone Encounter (Signed)
Form,[employer] to Nurse Butch Penny and Dr Jannifer Franklin to be completed 03-23-14.

## 2014-03-24 DIAGNOSIS — Z0289 Encounter for other administrative examinations: Secondary | ICD-10-CM

## 2014-04-06 NOTE — Telephone Encounter (Signed)
LTD form completed and given to medical records.

## 2014-04-16 NOTE — Telephone Encounter (Signed)
Form,[employer] received,completed by Dr Jannifer Franklin and Nurse Butch Penny faxed 04-15-14.

## 2014-06-09 ENCOUNTER — Telehealth: Payer: Self-pay | Admitting: Neurology

## 2014-06-09 DIAGNOSIS — Z5181 Encounter for therapeutic drug level monitoring: Secondary | ICD-10-CM

## 2014-06-09 NOTE — Telephone Encounter (Signed)
Patient called wanting to see if Dr. Jannifer Franklin can up her dosage for carbamazepine (TEGRETOL) 200 MG tablet. Patient states that her current dosage is not relieving the pain. Please call and advice # [phone]

## 2014-06-09 NOTE — Telephone Encounter (Signed)
I called patient. She is having ongoing discomfort on the carbamazepine taking 400 mg twice daily. We will need to check blood levels before we go any higher on the dose. I will put in orders for the blood work, the patient will come in to get this done. The patient indicates intolerance to gabapentin associated with some gait instability, but this has persisted off of the gabapentin.

## 2014-06-28 DIAGNOSIS — M25511 Pain in right shoulder: Secondary | ICD-10-CM | POA: Diagnosis not present

## 2014-07-06 DIAGNOSIS — M25511 Pain in right shoulder: Secondary | ICD-10-CM | POA: Diagnosis not present

## 2014-07-06 DIAGNOSIS — G8929 Other chronic pain: Secondary | ICD-10-CM | POA: Diagnosis not present

## 2014-07-14 DIAGNOSIS — M19011 Primary osteoarthritis, right shoulder: Secondary | ICD-10-CM | POA: Diagnosis not present

## 2014-07-14 DIAGNOSIS — S43431D Superior glenoid labrum lesion of right shoulder, subsequent encounter: Secondary | ICD-10-CM | POA: Diagnosis not present

## 2014-07-14 DIAGNOSIS — M75101 Unspecified rotator cuff tear or rupture of right shoulder, not specified as traumatic: Secondary | ICD-10-CM | POA: Diagnosis not present

## 2014-07-27 ENCOUNTER — Encounter: Payer: Self-pay | Admitting: *Deleted

## 2014-07-27 DIAGNOSIS — M19011 Primary osteoarthritis, right shoulder: Secondary | ICD-10-CM | POA: Diagnosis not present

## 2014-07-27 DIAGNOSIS — M75101 Unspecified rotator cuff tear or rupture of right shoulder, not specified as traumatic: Secondary | ICD-10-CM | POA: Diagnosis not present

## 2014-07-27 DIAGNOSIS — G8918 Other acute postprocedural pain: Secondary | ICD-10-CM | POA: Diagnosis not present

## 2014-07-27 DIAGNOSIS — M24111 Other articular cartilage disorders, right shoulder: Secondary | ICD-10-CM | POA: Diagnosis not present

## 2014-07-27 DIAGNOSIS — M7521 Bicipital tendinitis, right shoulder: Secondary | ICD-10-CM | POA: Diagnosis not present

## 2014-07-27 DIAGNOSIS — M7541 Impingement syndrome of right shoulder: Secondary | ICD-10-CM | POA: Diagnosis not present

## 2014-08-04 DIAGNOSIS — Z4789 Encounter for other orthopedic aftercare: Secondary | ICD-10-CM | POA: Diagnosis not present

## 2014-08-04 DIAGNOSIS — S43431D Superior glenoid labrum lesion of right shoulder, subsequent encounter: Secondary | ICD-10-CM | POA: Diagnosis not present

## 2014-08-10 DIAGNOSIS — S43431D Superior glenoid labrum lesion of right shoulder, subsequent encounter: Secondary | ICD-10-CM | POA: Diagnosis not present

## 2014-08-11 ENCOUNTER — Encounter: Payer: Self-pay | Admitting: Adult Health

## 2014-08-11 ENCOUNTER — Ambulatory Visit (INDEPENDENT_AMBULATORY_CARE_PROVIDER_SITE_OTHER): Payer: Commercial Managed Care - HMO | Admitting: Adult Health

## 2014-08-11 VITALS — BP 120/72 | HR 68 | Ht 65.0 in | Wt 241.0 lb

## 2014-08-11 DIAGNOSIS — G501 Atypical facial pain: Secondary | ICD-10-CM | POA: Diagnosis not present

## 2014-08-11 DIAGNOSIS — Z5181 Encounter for therapeutic drug level monitoring: Secondary | ICD-10-CM

## 2014-08-11 NOTE — Patient Instructions (Signed)
Continue the Carbamazepine.  We will check blood work today.  If your symptoms worsen please let us know.

## 2014-08-11 NOTE — Progress Notes (Signed)
I have read the note, and I agree with the clinical assessment and plan.  Lavonia Eager KEITH   
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
## 2014-08-12 ENCOUNTER — Telehealth: Payer: Self-pay

## 2014-08-12 LAB — CBC WITH DIFFERENTIAL/PLATELET
Basophils Absolute: 0 10*3/uL (ref 0.0–0.2)
Basos: 1 %
EOS (ABSOLUTE): 0.2 10*3/uL (ref 0.0–0.4)
Eos: 3 %
Hematocrit: 33.1 % — ABNORMAL LOW (ref 34.0–46.6)
Hemoglobin: 10.6 g/dL — ABNORMAL LOW (ref 11.1–15.9)
Immature Grans (Abs): 0 10*3/uL (ref 0.0–0.1)
Immature Granulocytes: 0 %
Lymphocytes Absolute: 1.4 10*3/uL (ref 0.7–3.1)
Lymphs: 29 %
MCH: 27.8 pg (ref 26.6–33.0)
MCHC: 32 g/dL (ref 31.5–35.7)
MCV: 87 fL (ref 79–97)
Monocytes Absolute: 0.3 10*3/uL (ref 0.1–0.9)
Monocytes: 7 %
Neutrophils Absolute: 2.8 10*3/uL (ref 1.4–7.0)
Neutrophils: 60 %
Platelets: 216 10*3/uL (ref 150–379)
RBC: 3.81 x10E6/uL (ref 3.77–5.28)
RDW: 13.9 % (ref 12.3–15.4)
WBC: 4.6 10*3/uL (ref 3.4–10.8)

## 2014-08-12 LAB — COMPREHENSIVE METABOLIC PANEL
ALBUMIN: 3.7 g/dL (ref 3.5–5.5)
ALT: 11 IU/L (ref 0–32)
AST: 17 IU/L (ref 0–40)
Albumin/Globulin Ratio: 1.2 (ref 1.1–2.5)
Alkaline Phosphatase: 140 IU/L — ABNORMAL HIGH (ref 39–117)
BUN/Creatinine Ratio: 16 (ref 9–23)
BUN: 18 mg/dL (ref 6–24)
CHLORIDE: 104 mmol/L (ref 97–108)
CO2: 21 mmol/L (ref 18–29)
Calcium: 9.2 mg/dL (ref 8.7–10.2)
Creatinine, Ser: 1.12 mg/dL — ABNORMAL HIGH (ref 0.57–1.00)
GFR calc non Af Amer: 55 mL/min/{1.73_m2} — ABNORMAL LOW (ref >59)
GFR, EST AFRICAN AMERICAN: 63 mL/min/{1.73_m2} (ref >59)
GLUCOSE: 102 mg/dL — AB (ref 65–99)
Globulin, Total: 3.2 g/dL (ref 1.5–4.5)
Potassium: 4.6 mmol/L (ref 3.5–5.2)
Sodium: 140 mmol/L (ref 134–144)
TOTAL PROTEIN: 6.9 g/dL (ref 6.0–8.5)

## 2014-08-12 LAB — CARBAMAZEPINE LEVEL, TOTAL: Carbamazepine Lvl: 10.8 ug/mL (ref 4.0–12.0)

## 2014-08-12 NOTE — Telephone Encounter (Signed)
Spoke to the patient and relayed results.

## 2014-08-12 NOTE — Telephone Encounter (Signed)
-----   Message from Kathrynn Ducking, MD sent at 08/12/2014  7:54 AM EDT ----- Blood work shows a mild stable reduction in kidney function, a mild elevation in one of the liver enzymes, alkaline phosphatase that is commonly elevated in individuals on carbamazepine. Of note clinical concern. There is a mild stable anemia. Carbamazepine level is good, no dose adjustments. Please call the patient.  ----- Message -----    From: [facility] Lab Results In Interface    Sent: 08/12/2014   5:43 AM      To: Kathrynn Ducking, MD

## 2014-08-13 DIAGNOSIS — S43431D Superior glenoid labrum lesion of right shoulder, subsequent encounter: Secondary | ICD-10-CM | POA: Diagnosis not present

## 2014-08-17 DIAGNOSIS — S43431D Superior glenoid labrum lesion of right shoulder, subsequent encounter: Secondary | ICD-10-CM | POA: Diagnosis not present

## 2014-08-20 DIAGNOSIS — S43431D Superior glenoid labrum lesion of right shoulder, subsequent encounter: Secondary | ICD-10-CM | POA: Diagnosis not present

## 2014-08-24 DIAGNOSIS — S43431D Superior glenoid labrum lesion of right shoulder, subsequent encounter: Secondary | ICD-10-CM | POA: Diagnosis not present

## 2014-08-31 DIAGNOSIS — S43431D Superior glenoid labrum lesion of right shoulder, subsequent encounter: Secondary | ICD-10-CM | POA: Diagnosis not present

## 2014-09-01 DIAGNOSIS — S43431D Superior glenoid labrum lesion of right shoulder, subsequent encounter: Secondary | ICD-10-CM | POA: Diagnosis not present

## 2014-09-01 DIAGNOSIS — Z4789 Encounter for other orthopedic aftercare: Secondary | ICD-10-CM | POA: Diagnosis not present

## 2014-09-03 DIAGNOSIS — S43431D Superior glenoid labrum lesion of right shoulder, subsequent encounter: Secondary | ICD-10-CM | POA: Diagnosis not present

## 2014-09-06 DIAGNOSIS — S39013A Strain of muscle, fascia and tendon of pelvis, initial encounter: Secondary | ICD-10-CM | POA: Diagnosis not present

## 2014-09-17 DIAGNOSIS — S43431D Superior glenoid labrum lesion of right shoulder, subsequent encounter: Secondary | ICD-10-CM | POA: Diagnosis not present

## 2014-10-08 DIAGNOSIS — Z1231 Encounter for screening mammogram for malignant neoplasm of breast: Secondary | ICD-10-CM | POA: Diagnosis not present

## 2014-10-19 DIAGNOSIS — R1030 Lower abdominal pain, unspecified: Secondary | ICD-10-CM | POA: Diagnosis not present

## 2014-11-01 DIAGNOSIS — Z4789 Encounter for other orthopedic aftercare: Secondary | ICD-10-CM | POA: Diagnosis not present

## 2014-11-01 DIAGNOSIS — M75101 Unspecified rotator cuff tear or rupture of right shoulder, not specified as traumatic: Secondary | ICD-10-CM | POA: Diagnosis not present

## 2014-11-01 DIAGNOSIS — S43431D Superior glenoid labrum lesion of right shoulder, subsequent encounter: Secondary | ICD-10-CM | POA: Diagnosis not present

## 2015-01-04 DIAGNOSIS — E785 Hyperlipidemia, unspecified: Secondary | ICD-10-CM | POA: Diagnosis not present

## 2015-01-04 DIAGNOSIS — E559 Vitamin D deficiency, unspecified: Secondary | ICD-10-CM | POA: Diagnosis not present

## 2015-01-04 DIAGNOSIS — Z23 Encounter for immunization: Secondary | ICD-10-CM | POA: Diagnosis not present

## 2015-01-04 DIAGNOSIS — Z0000 Encounter for general adult medical examination without abnormal findings: Secondary | ICD-10-CM | POA: Diagnosis not present

## 2015-01-04 DIAGNOSIS — F339 Major depressive disorder, recurrent, unspecified: Secondary | ICD-10-CM | POA: Diagnosis not present

## 2015-01-04 DIAGNOSIS — M810 Age-related osteoporosis without current pathological fracture: Secondary | ICD-10-CM | POA: Diagnosis not present

## 2015-01-04 DIAGNOSIS — M169 Osteoarthritis of hip, unspecified: Secondary | ICD-10-CM | POA: Diagnosis not present

## 2015-01-04 DIAGNOSIS — I10 Essential (primary) hypertension: Secondary | ICD-10-CM | POA: Diagnosis not present

## 2015-01-05 DIAGNOSIS — D649 Anemia, unspecified: Secondary | ICD-10-CM | POA: Diagnosis not present

## 2015-01-25 DIAGNOSIS — N289 Disorder of kidney and ureter, unspecified: Secondary | ICD-10-CM | POA: Diagnosis not present

## 2015-01-25 DIAGNOSIS — Z79899 Other long term (current) drug therapy: Secondary | ICD-10-CM | POA: Diagnosis not present

## 2015-01-25 DIAGNOSIS — E785 Hyperlipidemia, unspecified: Secondary | ICD-10-CM | POA: Diagnosis not present

## 2015-02-07 DIAGNOSIS — F324 Major depressive disorder, single episode, in partial remission: Secondary | ICD-10-CM | POA: Diagnosis not present

## 2015-02-07 DIAGNOSIS — E785 Hyperlipidemia, unspecified: Secondary | ICD-10-CM | POA: Diagnosis not present

## 2015-02-07 DIAGNOSIS — I10 Essential (primary) hypertension: Secondary | ICD-10-CM | POA: Diagnosis not present

## 2015-02-07 DIAGNOSIS — M25552 Pain in left hip: Secondary | ICD-10-CM | POA: Diagnosis not present

## 2015-02-08 DIAGNOSIS — Z961 Presence of intraocular lens: Secondary | ICD-10-CM | POA: Insufficient documentation

## 2015-02-08 DIAGNOSIS — H3342 Traction detachment of retina, left eye: Secondary | ICD-10-CM | POA: Insufficient documentation

## 2015-02-08 DIAGNOSIS — H15002 Unspecified scleritis, left eye: Secondary | ICD-10-CM | POA: Diagnosis not present

## 2015-02-10 ENCOUNTER — Encounter: Payer: Self-pay | Admitting: Neurology

## 2015-02-10 ENCOUNTER — Ambulatory Visit (INDEPENDENT_AMBULATORY_CARE_PROVIDER_SITE_OTHER): Payer: Commercial Managed Care - HMO | Admitting: Neurology

## 2015-02-10 VITALS — BP 122/60 | HR 73 | Ht 65.0 in | Wt 235.5 lb

## 2015-02-10 DIAGNOSIS — G501 Atypical facial pain: Secondary | ICD-10-CM

## 2015-02-10 MED ORDER — CARBAMAZEPINE 200 MG PO TABS: 400.0000 mg | ORAL_TABLET | Freq: Two times a day (BID) | ORAL | Status: DC
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
## 2015-02-25 DIAGNOSIS — M810 Age-related osteoporosis without current pathological fracture: Secondary | ICD-10-CM | POA: Diagnosis not present

## 2015-03-02 DIAGNOSIS — R300 Dysuria: Secondary | ICD-10-CM | POA: Diagnosis not present

## 2015-03-02 DIAGNOSIS — N390 Urinary tract infection, site not specified: Secondary | ICD-10-CM | POA: Diagnosis not present

## 2015-03-02 DIAGNOSIS — T148 Other injury of unspecified body region: Secondary | ICD-10-CM | POA: Diagnosis not present

## 2015-03-21 DIAGNOSIS — M810 Age-related osteoporosis without current pathological fracture: Secondary | ICD-10-CM | POA: Diagnosis not present

## 2015-03-30 DIAGNOSIS — R1030 Lower abdominal pain, unspecified: Secondary | ICD-10-CM | POA: Diagnosis not present

## 2015-03-30 DIAGNOSIS — M1612 Unilateral primary osteoarthritis, left hip: Secondary | ICD-10-CM | POA: Diagnosis not present

## 2015-05-16 DIAGNOSIS — I10 Essential (primary) hypertension: Secondary | ICD-10-CM | POA: Diagnosis not present

## 2015-05-16 DIAGNOSIS — M169 Osteoarthritis of hip, unspecified: Secondary | ICD-10-CM | POA: Diagnosis not present

## 2015-05-16 DIAGNOSIS — F419 Anxiety disorder, unspecified: Secondary | ICD-10-CM | POA: Diagnosis not present

## 2015-05-16 DIAGNOSIS — E559 Vitamin D deficiency, unspecified: Secondary | ICD-10-CM | POA: Diagnosis not present

## 2015-05-16 DIAGNOSIS — E785 Hyperlipidemia, unspecified: Secondary | ICD-10-CM | POA: Diagnosis not present

## 2015-05-16 DIAGNOSIS — F339 Major depressive disorder, recurrent, unspecified: Secondary | ICD-10-CM | POA: Diagnosis not present

## 2015-08-08 ENCOUNTER — Ambulatory Visit (INDEPENDENT_AMBULATORY_CARE_PROVIDER_SITE_OTHER): Payer: Commercial Managed Care - HMO | Admitting: Adult Health

## 2015-08-08 ENCOUNTER — Encounter: Payer: Self-pay | Admitting: Adult Health

## 2015-08-08 VITALS — BP 132/64 | HR 64 | Ht 65.0 in | Wt 228.8 lb

## 2015-08-08 DIAGNOSIS — G501 Atypical facial pain: Secondary | ICD-10-CM | POA: Diagnosis not present

## 2015-08-08 DIAGNOSIS — Z5181 Encounter for therapeutic drug level monitoring: Secondary | ICD-10-CM | POA: Diagnosis not present
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
## 2015-08-08 NOTE — Progress Notes (Signed)
I have read the note, and I agree with the clinical assessment and plan.  Mayeli Bornhorst KEITH   

## 2015-08-08 NOTE — Patient Instructions (Signed)
Continue Carbamazepine Blood work today If your symptoms worsen or you develop new symptoms please let us know.   

## 2015-08-09 ENCOUNTER — Telehealth: Payer: Self-pay | Admitting: *Deleted

## 2015-08-09 DIAGNOSIS — Z961 Presence of intraocular lens: Secondary | ICD-10-CM | POA: Diagnosis not present

## 2015-08-09 DIAGNOSIS — H3342 Traction detachment of retina, left eye: Secondary | ICD-10-CM | POA: Diagnosis not present

## 2015-08-09 DIAGNOSIS — H15002 Unspecified scleritis, left eye: Secondary | ICD-10-CM | POA: Diagnosis not present

## 2015-08-09 LAB — CBC WITH DIFFERENTIAL/PLATELET
BASOS ABS: 0 10*3/uL (ref 0.0–0.2)
BASOS: 0 %
EOS (ABSOLUTE): 0.1 10*3/uL (ref 0.0–0.4)
Eos: 2 %
Hematocrit: 30.1 % — ABNORMAL LOW (ref 34.0–46.6)
Hemoglobin: 10 g/dL — ABNORMAL LOW (ref 11.1–15.9)
IMMATURE GRANS (ABS): 0 10*3/uL (ref 0.0–0.1)
Immature Granulocytes: 0 %
LYMPHS ABS: 1.7 10*3/uL (ref 0.7–3.1)
LYMPHS: 35 %
MCH: 28.7 pg (ref 26.6–33.0)
MCHC: 33.2 g/dL (ref 31.5–35.7)
MCV: 87 fL (ref 79–97)
Monocytes Absolute: 0.4 10*3/uL (ref 0.1–0.9)
Monocytes: 8 %
NEUTROS ABS: 2.7 10*3/uL (ref 1.4–7.0)
Neutrophils: 55 %
PLATELETS: 263 10*3/uL (ref 150–379)
RBC: 3.48 x10E6/uL — ABNORMAL LOW (ref 3.77–5.28)
RDW: 14.9 % (ref 12.3–15.4)
WBC: 4.9 10*3/uL (ref 3.4–10.8)

## 2015-08-09 LAB — COMPREHENSIVE METABOLIC PANEL
A/G RATIO: 1.3 (ref 1.2–2.2)
ALK PHOS: 121 IU/L — AB (ref 39–117)
ALT: 8 IU/L (ref 0–32)
AST: 11 IU/L (ref 0–40)
Albumin: 3.9 g/dL (ref 3.5–5.5)
BUN/Creatinine Ratio: 13 (ref 9–23)
BUN: 15 mg/dL (ref 6–24)
CHLORIDE: 103 mmol/L (ref 96–106)
CO2: 22 mmol/L (ref 18–29)
Calcium: 9.4 mg/dL (ref 8.7–10.2)
Creatinine, Ser: 1.13 mg/dL — ABNORMAL HIGH (ref 0.57–1.00)
GFR calc Af Amer: 62 mL/min/{1.73_m2} (ref >59)
GFR calc non Af Amer: 54 mL/min/{1.73_m2} — ABNORMAL LOW (ref >59)
Globulin, Total: 3.1 g/dL (ref 1.5–4.5)
Glucose: 100 mg/dL — ABNORMAL HIGH (ref 65–99)
Potassium: 4.8 mmol/L (ref 3.5–5.2)
Sodium: 140 mmol/L (ref 134–144)
TOTAL PROTEIN: 7 g/dL (ref 6.0–8.5)

## 2015-08-09 LAB — CARBAMAZEPINE LEVEL, TOTAL: CARBAMAZEPINE LVL: 9.3 ug/mL (ref 4.0–12.0)

## 2015-08-09 NOTE — Telephone Encounter (Signed)
I spoke to pt and relayed that the lab results consistent with previous lab work.   She does have some anemia [resent.  Will fax results to Dr. Wynelle Cleveland with [facility]. Pt verbalized understanding.

## 2015-08-09 NOTE — Telephone Encounter (Signed)
-----   Message from Ward Givens, NP sent at 08/09/2015  7:34 AM EDT ----- Lab work consistent with previous lab work. Anemia present. Please call patient and forward to PCP

## 2015-08-09 NOTE — Telephone Encounter (Signed)
Fax confirmation received for lab results to Dr. Wynelle Cleveland, pcp.

## 2015-08-11 ENCOUNTER — Ambulatory Visit: Payer: Commercial Managed Care - HMO | Admitting: Adult Health

## 2015-09-02 DIAGNOSIS — M810 Age-related osteoporosis without current pathological fracture: Secondary | ICD-10-CM | POA: Diagnosis not present

## 2015-09-05 DIAGNOSIS — E785 Hyperlipidemia, unspecified: Secondary | ICD-10-CM | POA: Diagnosis not present

## 2015-09-05 DIAGNOSIS — M25552 Pain in left hip: Secondary | ICD-10-CM | POA: Diagnosis not present

## 2015-09-05 DIAGNOSIS — F339 Major depressive disorder, recurrent, unspecified: Secondary | ICD-10-CM | POA: Diagnosis not present

## 2015-09-05 DIAGNOSIS — I10 Essential (primary) hypertension: Secondary | ICD-10-CM | POA: Diagnosis not present

## 2015-09-05 DIAGNOSIS — D649 Anemia, unspecified: Secondary | ICD-10-CM | POA: Diagnosis not present

## 2015-09-09 ENCOUNTER — Other Ambulatory Visit: Payer: Self-pay | Admitting: Neurology

## 2015-09-09 ENCOUNTER — Telehealth: Payer: Self-pay | Admitting: Neurology

## 2015-09-09 MED ORDER — CARBAMAZEPINE 200 MG PO TABS: 400.0000 mg | ORAL_TABLET | Freq: Two times a day (BID) | ORAL | Status: DC

## 2015-09-09 NOTE — Telephone Encounter (Signed)
Retailed to pharmacy as requested.

## 2015-09-09 NOTE — Telephone Encounter (Signed)
Patient requesting refill of Epitol 200mg  2 tabs 2 x daily Pharmacy: [facility] [place] [facility], [place] - [address]

## 2015-09-20 DIAGNOSIS — D649 Anemia, unspecified: Secondary | ICD-10-CM | POA: Diagnosis not present

## 2015-09-22 DIAGNOSIS — M1612 Unilateral primary osteoarthritis, left hip: Secondary | ICD-10-CM | POA: Diagnosis not present

## 2015-09-22 DIAGNOSIS — R1032 Left lower quadrant pain: Secondary | ICD-10-CM | POA: Diagnosis not present

## 2015-10-04 DIAGNOSIS — R300 Dysuria: Secondary | ICD-10-CM | POA: Diagnosis not present

## 2015-11-03 DIAGNOSIS — R1032 Left lower quadrant pain: Secondary | ICD-10-CM | POA: Diagnosis not present

## 2015-11-03 DIAGNOSIS — M1612 Unilateral primary osteoarthritis, left hip: Secondary | ICD-10-CM | POA: Diagnosis not present

## 2015-11-12 DIAGNOSIS — M1612 Unilateral primary osteoarthritis, left hip: Secondary | ICD-10-CM | POA: Diagnosis not present

## 2015-11-14 DIAGNOSIS — Z1231 Encounter for screening mammogram for malignant neoplasm of breast: Secondary | ICD-10-CM | POA: Diagnosis not present

## 2015-11-30 DIAGNOSIS — R1032 Left lower quadrant pain: Secondary | ICD-10-CM | POA: Diagnosis not present

## 2015-11-30 DIAGNOSIS — M1612 Unilateral primary osteoarthritis, left hip: Secondary | ICD-10-CM | POA: Diagnosis not present

## 2015-12-16 ENCOUNTER — Other Ambulatory Visit: Payer: Self-pay | Admitting: Orthopedic Surgery

## 2015-12-16 DIAGNOSIS — M25552 Pain in left hip: Secondary | ICD-10-CM | POA: Diagnosis not present

## 2015-12-26 ENCOUNTER — Other Ambulatory Visit: Payer: Self-pay | Admitting: Orthopedic Surgery

## 2016-01-05 ENCOUNTER — Encounter (HOSPITAL_COMMUNITY)
Admission: RE | Admit: 2016-01-05 | Discharge: 2016-01-05 | Disposition: A | Discharge status: Home / Self Care | Payer: Commercial Managed Care - HMO | Source: Ambulatory Visit | Attending: Orthopedic Surgery | Admitting: Orthopedic Surgery

## 2016-01-05 ENCOUNTER — Encounter (HOSPITAL_COMMUNITY): Payer: Self-pay

## 2016-01-05 DIAGNOSIS — Z01810 Encounter for preprocedural cardiovascular examination: Secondary | ICD-10-CM | POA: Insufficient documentation

## 2016-01-05 DIAGNOSIS — Z01812 Encounter for preprocedural laboratory examination: Secondary | ICD-10-CM | POA: Diagnosis not present

## 2016-01-05 DIAGNOSIS — Z01818 Encounter for other preprocedural examination: Secondary | ICD-10-CM | POA: Diagnosis present

## 2016-01-05 HISTORY — DX: Gastro-esophageal reflux disease without esophagitis: K21.9

## 2016-01-05 LAB — CBC
HEMATOCRIT: 34.7 % — AB (ref 36.0–46.0)
HEMOGLOBIN: 11.1 g/dL — AB (ref 12.0–15.0)
MCH: 28.6 pg (ref 26.0–34.0)
MCHC: 32 g/dL (ref 30.0–36.0)
MCV: 89.4 fL (ref 78.0–100.0)
Platelets: 252 10*3/uL (ref 150–400)
RBC: 3.88 MIL/uL (ref 3.87–5.11)
RDW: 13.7 % (ref 11.5–15.5)
WBC: 5 10*3/uL (ref 4.0–10.5)

## 2016-01-05 LAB — BASIC METABOLIC PANEL
ANION GAP: 7 (ref 5–15)
BUN: 12 mg/dL (ref 6–20)
CALCIUM: 9.3 mg/dL (ref 8.9–10.3)
CO2: 24 mmol/L (ref 22–32)
Chloride: 104 mmol/L (ref 101–111)
Creatinine, Ser: 1.04 mg/dL — ABNORMAL HIGH (ref 0.44–1.00)
GFR, EST NON AFRICAN AMERICAN: 58 mL/min — AB (ref >60)
GLUCOSE: 107 mg/dL — AB (ref 65–99)
POTASSIUM: 4.5 mmol/L (ref 3.5–5.1)
SODIUM: 135 mmol/L (ref 135–145)

## 2016-01-05 LAB — SURGICAL PCR SCREEN
MRSA, PCR: NEGATIVE
STAPHYLOCOCCUS AUREUS: POSITIVE — AB

## 2016-01-05 NOTE — Pre-Procedure Instructions (Signed)
    KYNLIE HYUN  01/05/2016     Your procedure is scheduled on Tuesday, November 28.  Report to [facility] Admitting at 8:00 AM                Your surgery or procedure is scheduled for 10:00 AM   Call this number if you have problems the morning of [phone]                For any other questions, please call [phone], Monday - Friday 8 AM - 4 PM.   Remember:  Do not eat food or drink liquids after midnight Monday,November 27  Take these medicines the morning of surgery with A SIP OF WATER:carbamazepine (TEGRETOL), DULoxetine (CYMBALTA), metoprolol succinate (TOPROL-XL).                 Take if needed: cyclobenzaprine (FLEXERIL), LORazepam (ATIVAN), acetaminophen (TYLENOL).                        Stop taking Aspirin, Aspirin Products, Vitamins and Herbal medications.  Do not take any NSAIDS ie:  Ibuprofen, Advil, Naproxen.   Do not wear jewelry, make-up or nail polish.  Do not wear lotions, powders, or perfumes, or deodorant.  Do not shave 48 hours prior to surgery.    Do not bring valuables to the hospital.  [facility] is not responsible for any belongings or valuables.  Contacts, dentures or bridgework may not be worn into surgery.  Leave your suitcase in the car.  After surgery it may be brought to your room.  For patients admitted to the hospital, discharge time will be determined by your treatment team.  Special instructions: Review  [facility] - Preparing For Surgery.  Please read over the following fact sheets that you were given.[facility]- Preparing For Surgery and Patient Instructions for Mupirocin Application, Incentive Spirometry, Pain Booklet

## 2016-01-05 NOTE — Progress Notes (Signed)
I called a prescription for Mupirocin ointment to [facility], [place], [place]

## 2016-01-17 ENCOUNTER — Encounter (HOSPITAL_COMMUNITY): Payer: Self-pay | Admitting: *Deleted

## 2016-01-17 ENCOUNTER — Inpatient Hospital Stay (HOSPITAL_COMMUNITY)
Admission: RE | Admit: 2016-01-17 | Discharge: 2016-01-20 | DRG: 470 | Disposition: A | Discharge status: Home Health | Payer: Commercial Managed Care - HMO | Source: Ambulatory Visit | Attending: Orthopedic Surgery | Admitting: Orthopedic Surgery

## 2016-01-17 ENCOUNTER — Inpatient Hospital Stay (HOSPITAL_COMMUNITY): Payer: Commercial Managed Care - HMO

## 2016-01-17 ENCOUNTER — Inpatient Hospital Stay (HOSPITAL_COMMUNITY): Payer: Commercial Managed Care - HMO | Admitting: Anesthesiology

## 2016-01-17 ENCOUNTER — Encounter (HOSPITAL_COMMUNITY)
Admission: RE | Disposition: A | Discharge status: Home Health | Payer: Self-pay | Source: Ambulatory Visit | Attending: Orthopedic Surgery

## 2016-01-17 DIAGNOSIS — E785 Hyperlipidemia, unspecified: Secondary | ICD-10-CM | POA: Diagnosis present

## 2016-01-17 DIAGNOSIS — D62 Acute posthemorrhagic anemia: Secondary | ICD-10-CM | POA: Diagnosis not present

## 2016-01-17 DIAGNOSIS — F418 Other specified anxiety disorders: Secondary | ICD-10-CM | POA: Diagnosis present

## 2016-01-17 DIAGNOSIS — E669 Obesity, unspecified: Secondary | ICD-10-CM | POA: Diagnosis not present

## 2016-01-17 DIAGNOSIS — Z6836 Body mass index (BMI) 36.0-36.9, adult: Secondary | ICD-10-CM

## 2016-01-17 DIAGNOSIS — Z833 Family history of diabetes mellitus: Secondary | ICD-10-CM | POA: Diagnosis not present

## 2016-01-17 DIAGNOSIS — I10 Essential (primary) hypertension: Secondary | ICD-10-CM | POA: Diagnosis not present

## 2016-01-17 DIAGNOSIS — Z7982 Long term (current) use of aspirin: Secondary | ICD-10-CM | POA: Diagnosis not present

## 2016-01-17 DIAGNOSIS — K219 Gastro-esophageal reflux disease without esophagitis: Secondary | ICD-10-CM | POA: Diagnosis present

## 2016-01-17 DIAGNOSIS — F329 Major depressive disorder, single episode, unspecified: Secondary | ICD-10-CM | POA: Diagnosis present

## 2016-01-17 DIAGNOSIS — F419 Anxiety disorder, unspecified: Secondary | ICD-10-CM | POA: Diagnosis present

## 2016-01-17 DIAGNOSIS — M87052 Idiopathic aseptic necrosis of left femur: Secondary | ICD-10-CM | POA: Diagnosis present

## 2016-01-17 DIAGNOSIS — H5462 Unqualified visual loss, left eye, normal vision right eye: Secondary | ICD-10-CM | POA: Diagnosis present

## 2016-01-17 DIAGNOSIS — Z96649 Presence of unspecified artificial hip joint: Secondary | ICD-10-CM

## 2016-01-17 DIAGNOSIS — M25552 Pain in left hip: Secondary | ICD-10-CM | POA: Diagnosis present

## 2016-01-17 DIAGNOSIS — Z8249 Family history of ischemic heart disease and other diseases of the circulatory system: Secondary | ICD-10-CM | POA: Diagnosis not present

## 2016-01-17 DIAGNOSIS — M1612 Unilateral primary osteoarthritis, left hip: Secondary | ICD-10-CM | POA: Diagnosis not present

## 2016-01-17 DIAGNOSIS — M879 Osteonecrosis, unspecified: Principal | ICD-10-CM | POA: Diagnosis present

## 2016-01-17 DIAGNOSIS — M1610 Unilateral primary osteoarthritis, unspecified hip: Secondary | ICD-10-CM

## 2016-01-17 HISTORY — DX: Idiopathic aseptic necrosis of left femur: M87.052

## 2016-01-17 HISTORY — PX: TOTAL HIP ARTHROPLASTY: SHX124

## 2016-01-17 SURGERY — ARTHROPLASTY, HIP, TOTAL,POSTERIOR APPROACH
Anesthesia: Spinal | Laterality: Left

## 2016-01-17 MED ORDER — VITAMIN D 1000 UNITS PO TABS
1000.0000 [IU] | ORAL_TABLET | Freq: Every day | ORAL | Status: DC
  Administered 2016-01-17 – 2016-01-20 (×4): 1000 [IU] via ORAL
  Filled 2016-01-17 (×4): qty 1

## 2016-01-17 MED ORDER — POTASSIUM CHLORIDE IN NACL 20-0.45 MEQ/L-% IV SOLN
INTRAVENOUS | Status: DC
  Administered 2016-01-17: 22:00:00 via INTRAVENOUS
  Filled 2016-01-17 (×3): qty 1000

## 2016-01-17 MED ORDER — DIPHENHYDRAMINE HCL 12.5 MG/5ML PO ELIX: 12.5000 mg | ORAL_SOLUTION | ORAL | Status: DC | PRN

## 2016-01-17 MED ORDER — ALUM & MAG HYDROXIDE-SIMETH 200-200-20 MG/5ML PO SUSP: 30.0000 mL | ORAL | Status: DC | PRN

## 2016-01-17 MED ORDER — ONDANSETRON HCL 4 MG PO TABS: 4.0000 mg | ORAL_TABLET | Freq: Three times a day (TID) | ORAL | 0 refills | Status: DC | PRN

## 2016-01-17 MED ORDER — DEXTROSE 5 % IV SOLN
INTRAVENOUS | Status: DC | PRN
  Administered 2016-01-17: 10 ug/min via INTRAVENOUS

## 2016-01-17 MED ORDER — CALCIUM GLUCONATE 500 MG PO TABS
1.0000 | ORAL_TABLET | Freq: Every day | ORAL | Status: DC
  Filled 2016-01-17: qty 1

## 2016-01-17 MED ORDER — CEFAZOLIN SODIUM-DEXTROSE 2-4 GM/100ML-% IV SOLN
2.0000 g | Freq: Four times a day (QID) | INTRAVENOUS | Status: AC
Start: 2016-01-17 — End: 2016-01-18
  Administered 2016-01-17: 2 g via INTRAVENOUS
  Filled 2016-01-17 (×2): qty 100

## 2016-01-17 MED ORDER — HYDROMORPHONE HCL 1 MG/ML IJ SOLN
INTRAMUSCULAR | Status: AC
  Filled 2016-01-17: qty 0.5

## 2016-01-17 MED ORDER — FAMOTIDINE IN NACL 20-0.9 MG/50ML-% IV SOLN
20.0000 mg | INTRAVENOUS | Status: AC
  Administered 2016-01-17: 20 mg via INTRAVENOUS
  Filled 2016-01-17: qty 50

## 2016-01-17 MED ORDER — 0.9 % SODIUM CHLORIDE (POUR BTL) OPTIME
TOPICAL | Status: DC | PRN
  Administered 2016-01-17: 1000 mL

## 2016-01-17 MED ORDER — HYDROMORPHONE HCL 1 MG/ML IJ SOLN: 0.5000 mg | INTRAMUSCULAR | Status: DC | PRN

## 2016-01-17 MED ORDER — OXYCODONE HCL 5 MG PO TABS
5.0000 mg | ORAL_TABLET | ORAL | Status: DC | PRN
  Administered 2016-01-17 – 2016-01-18 (×3): 10 mg via ORAL
  Administered 2016-01-18 – 2016-01-19 (×4): 5 mg via ORAL
  Administered 2016-01-19: 10 mg via ORAL
  Administered 2016-01-19: 5 mg via ORAL
  Administered 2016-01-20 (×3): 10 mg via ORAL
  Filled 2016-01-17: qty 2
  Filled 2016-01-17 (×3): qty 1
  Filled 2016-01-17: qty 2
  Filled 2016-01-17: qty 1
  Filled 2016-01-17: qty 2
  Filled 2016-01-17: qty 1
  Filled 2016-01-17 (×4): qty 2

## 2016-01-17 MED ORDER — OXYCODONE HCL 5 MG/5ML PO SOLN: 5.0000 mg | Freq: Once | ORAL | Status: DC | PRN

## 2016-01-17 MED ORDER — ACETAMINOPHEN 325 MG PO TABS
650.0000 mg | ORAL_TABLET | Freq: Four times a day (QID) | ORAL | Status: DC | PRN
  Administered 2016-01-19: 650 mg via ORAL
  Filled 2016-01-17: qty 2

## 2016-01-17 MED ORDER — PROPOFOL 500 MG/50ML IV EMUL
INTRAVENOUS | Status: AC
  Filled 2016-01-17: qty 100

## 2016-01-17 MED ORDER — HYDROMORPHONE HCL 1 MG/ML IJ SOLN
0.2500 mg | INTRAMUSCULAR | Status: DC | PRN
  Administered 2016-01-17 (×3): 0.5 mg via INTRAVENOUS

## 2016-01-17 MED ORDER — DEXAMETHASONE SODIUM PHOSPHATE 10 MG/ML IJ SOLN
10.0000 mg | Freq: Once | INTRAMUSCULAR | Status: AC
  Administered 2016-01-18: 10 mg via INTRAVENOUS
  Filled 2016-01-17: qty 1

## 2016-01-17 MED ORDER — BUPIVACAINE HCL (PF) 0.5 % IJ SOLN
INTRAMUSCULAR | Status: DC | PRN
  Administered 2016-01-17: 3 mL via INTRATHECAL

## 2016-01-17 MED ORDER — MIDAZOLAM HCL 2 MG/2ML IJ SOLN
INTRAMUSCULAR | Status: AC
  Filled 2016-01-17: qty 2

## 2016-01-17 MED ORDER — LORAZEPAM 0.5 MG PO TABS: 0.5000 mg | ORAL_TABLET | Freq: Three times a day (TID) | ORAL | Status: DC | PRN

## 2016-01-17 MED ORDER — BISACODYL 10 MG RE SUPP
10.0000 mg | Freq: Every day | RECTAL | Status: DC | PRN
  Filled 2016-01-17: qty 1

## 2016-01-17 MED ORDER — LISINOPRIL 10 MG PO TABS
10.0000 mg | ORAL_TABLET | Freq: Every evening | ORAL | Status: DC
  Administered 2016-01-18: 10 mg via ORAL
  Filled 2016-01-17 (×3): qty 1

## 2016-01-17 MED ORDER — MENTHOL 3 MG MT LOZG: 1.0000 | LOZENGE | OROMUCOSAL | Status: DC | PRN

## 2016-01-17 MED ORDER — FENTANYL CITRATE (PF) 100 MCG/2ML IJ SOLN
INTRAMUSCULAR | Status: AC
  Filled 2016-01-17: qty 2

## 2016-01-17 MED ORDER — BUPIVACAINE HCL (PF) 0.25 % IJ SOLN
INTRAMUSCULAR | Status: AC
  Filled 2016-01-17: qty 30

## 2016-01-17 MED ORDER — CARBAMAZEPINE 200 MG PO TABS
400.0000 mg | ORAL_TABLET | Freq: Two times a day (BID) | ORAL | Status: DC
  Administered 2016-01-17 – 2016-01-20 (×6): 400 mg via ORAL
  Filled 2016-01-17 (×6): qty 2

## 2016-01-17 MED ORDER — RIVAROXABAN 10 MG PO TABS: 10.0000 mg | ORAL_TABLET | Freq: Every day | ORAL | 0 refills | Status: DC

## 2016-01-17 MED ORDER — METHOCARBAMOL 1000 MG/10ML IJ SOLN
500.0000 mg | Freq: Four times a day (QID) | INTRAVENOUS | Status: DC | PRN
  Filled 2016-01-17: qty 5

## 2016-01-17 MED ORDER — CYCLOBENZAPRINE HCL 10 MG PO TABS: 10.0000 mg | ORAL_TABLET | Freq: Three times a day (TID) | ORAL | 0 refills | Status: DC | PRN

## 2016-01-17 MED ORDER — SENNA-DOCUSATE SODIUM 8.6-50 MG PO TABS: 2.0000 | ORAL_TABLET | Freq: Every day | ORAL | 1 refills | Status: DC

## 2016-01-17 MED ORDER — LACTATED RINGERS IV SOLN
Freq: Once | INTRAVENOUS | Status: AC
  Administered 2016-01-17: 09:00:00 via INTRAVENOUS

## 2016-01-17 MED ORDER — MIDAZOLAM HCL 5 MG/5ML IJ SOLN
INTRAMUSCULAR | Status: DC | PRN
  Administered 2016-01-17 (×2): 1 mg via INTRAVENOUS

## 2016-01-17 MED ORDER — BUPIVACAINE HCL (PF) 0.25 % IJ SOLN
INTRAMUSCULAR | Status: DC | PRN
  Administered 2016-01-17: 30 mL

## 2016-01-17 MED ORDER — METOCLOPRAMIDE HCL 5 MG/ML IJ SOLN: 5.0000 mg | Freq: Three times a day (TID) | INTRAMUSCULAR | Status: DC | PRN

## 2016-01-17 MED ORDER — POLYETHYLENE GLYCOL 3350 17 G PO PACK
17.0000 g | PACK | Freq: Every day | ORAL | Status: DC | PRN
  Filled 2016-01-17: qty 1

## 2016-01-17 MED ORDER — PHENOL 1.4 % MT LIQD: 1.0000 | OROMUCOSAL | Status: DC | PRN

## 2016-01-17 MED ORDER — SENNA 8.6 MG PO TABS
1.0000 | ORAL_TABLET | Freq: Two times a day (BID) | ORAL | Status: DC
  Administered 2016-01-17 – 2016-01-20 (×6): 8.6 mg via ORAL
  Filled 2016-01-17 (×6): qty 1

## 2016-01-17 MED ORDER — ONDANSETRON HCL 4 MG/2ML IJ SOLN
4.0000 mg | Freq: Four times a day (QID) | INTRAMUSCULAR | Status: DC | PRN
  Administered 2016-01-17 – 2016-01-18 (×2): 4 mg via INTRAVENOUS
  Filled 2016-01-17 (×2): qty 2

## 2016-01-17 MED ORDER — KETOROLAC TROMETHAMINE 15 MG/ML IJ SOLN
15.0000 mg | Freq: Four times a day (QID) | INTRAMUSCULAR | Status: AC
  Administered 2016-01-17 – 2016-01-18 (×3): 15 mg via INTRAVENOUS
  Filled 2016-01-17 (×3): qty 1

## 2016-01-17 MED ORDER — METHOCARBAMOL 500 MG PO TABS
500.0000 mg | ORAL_TABLET | Freq: Four times a day (QID) | ORAL | Status: DC | PRN
  Administered 2016-01-17 – 2016-01-19 (×3): 500 mg via ORAL
  Filled 2016-01-17 (×3): qty 1

## 2016-01-17 MED ORDER — PROPOFOL 10 MG/ML IV BOLUS
INTRAVENOUS | Status: AC
  Filled 2016-01-17: qty 20

## 2016-01-17 MED ORDER — DOCUSATE SODIUM 100 MG PO CAPS
100.0000 mg | ORAL_CAPSULE | Freq: Two times a day (BID) | ORAL | Status: DC
  Administered 2016-01-17 – 2016-01-20 (×6): 100 mg via ORAL
  Filled 2016-01-17 (×6): qty 1

## 2016-01-17 MED ORDER — RIVAROXABAN 10 MG PO TABS
10.0000 mg | ORAL_TABLET | Freq: Every day | ORAL | Status: DC
  Administered 2016-01-18 – 2016-01-20 (×3): 10 mg via ORAL
  Filled 2016-01-17 (×3): qty 1

## 2016-01-17 MED ORDER — METOCLOPRAMIDE HCL 5 MG/ML IJ SOLN
INTRAMUSCULAR | Status: DC | PRN
  Administered 2016-01-17: 5 mg via INTRAVENOUS

## 2016-01-17 MED ORDER — OXYCODONE HCL 5 MG PO TABS: 5.0000 mg | ORAL_TABLET | Freq: Once | ORAL | Status: DC | PRN

## 2016-01-17 MED ORDER — OXYCODONE-ACETAMINOPHEN 10-325 MG PO TABS: 1.0000 | ORAL_TABLET | Freq: Four times a day (QID) | ORAL | 0 refills | Status: DC | PRN

## 2016-01-17 MED ORDER — METOCLOPRAMIDE HCL 5 MG PO TABS
5.0000 mg | ORAL_TABLET | Freq: Three times a day (TID) | ORAL | Status: DC | PRN
Start: 2016-01-17 — End: 2016-01-20

## 2016-01-17 MED ORDER — LIDOCAINE HCL (CARDIAC) 20 MG/ML IV SOLN
INTRAVENOUS | Status: DC | PRN
  Administered 2016-01-17: 50 mg via INTRAVENOUS

## 2016-01-17 MED ORDER — CEFAZOLIN SODIUM-DEXTROSE 2-4 GM/100ML-% IV SOLN
2.0000 g | INTRAVENOUS | Status: AC
  Administered 2016-01-17: 2 g via INTRAVENOUS

## 2016-01-17 MED ORDER — METOPROLOL SUCCINATE ER 50 MG PO TB24
50.0000 mg | ORAL_TABLET | Freq: Every day | ORAL | Status: DC
  Administered 2016-01-18 – 2016-01-20 (×3): 50 mg via ORAL
  Filled 2016-01-17 (×3): qty 1

## 2016-01-17 MED ORDER — ACETAMINOPHEN 650 MG RE SUPP: 650.0000 mg | Freq: Four times a day (QID) | RECTAL | Status: DC | PRN

## 2016-01-17 MED ORDER — LACTATED RINGERS IV SOLN
INTRAVENOUS | Status: DC | PRN
  Administered 2016-01-17: 10:00:00 via INTRAVENOUS

## 2016-01-17 MED ORDER — LIDOCAINE 2% (20 MG/ML) 5 ML SYRINGE
INTRAMUSCULAR | Status: AC
  Filled 2016-01-17: qty 5

## 2016-01-17 MED ORDER — ATORVASTATIN CALCIUM 20 MG PO TABS
20.0000 mg | ORAL_TABLET | Freq: Every evening | ORAL | Status: DC
  Administered 2016-01-18 – 2016-01-19 (×2): 20 mg via ORAL
  Filled 2016-01-17 (×2): qty 1

## 2016-01-17 MED ORDER — DULOXETINE HCL 60 MG PO CPEP
60.0000 mg | ORAL_CAPSULE | Freq: Every day | ORAL | Status: DC
  Administered 2016-01-18 – 2016-01-20 (×3): 60 mg via ORAL
  Filled 2016-01-17 (×3): qty 1

## 2016-01-17 MED ORDER — EPHEDRINE 5 MG/ML INJ
INTRAVENOUS | Status: AC
  Filled 2016-01-17: qty 10

## 2016-01-17 MED ORDER — PROPOFOL 500 MG/50ML IV EMUL
INTRAVENOUS | Status: DC | PRN
  Administered 2016-01-17: 75 ug/kg/min via INTRAVENOUS

## 2016-01-17 MED ORDER — HYDROMORPHONE HCL 2 MG/ML IJ SOLN: 0.5000 mg | INTRAMUSCULAR | Status: DC | PRN

## 2016-01-17 MED ORDER — CEFAZOLIN SODIUM-DEXTROSE 2-4 GM/100ML-% IV SOLN
INTRAVENOUS | Status: AC
  Filled 2016-01-17: qty 100

## 2016-01-17 MED ORDER — MAGNESIUM CITRATE PO SOLN: 1.0000 | Freq: Once | ORAL | Status: DC | PRN

## 2016-01-17 MED ORDER — ZOLPIDEM TARTRATE 5 MG PO TABS: 5.0000 mg | ORAL_TABLET | Freq: Every evening | ORAL | Status: DC | PRN

## 2016-01-17 MED ORDER — ONDANSETRON HCL 4 MG PO TABS
4.0000 mg | ORAL_TABLET | Freq: Four times a day (QID) | ORAL | Status: DC | PRN
  Administered 2016-01-18: 4 mg via ORAL
  Filled 2016-01-17: qty 1

## 2016-01-17 SURGICAL SUPPLY — 50 items
BIT DRILL 5/64X5 DISP (BIT) ×3 IMPLANT
BLADE SAW SAG 73X25 THK (BLADE) ×2
BLADE SAW SGTL 73X25 THK (BLADE) ×1 IMPLANT
CAPT HIP TOTAL 2 ×2 IMPLANT
CLOSURE STERI-STRIP 1/2X4 (GAUZE/BANDAGES/DRESSINGS) ×1
CLSR STERI-STRIP ANTIMIC 1/2X4 (GAUZE/BANDAGES/DRESSINGS) ×3 IMPLANT
COVER SURGICAL LIGHT HANDLE (MISCELLANEOUS) ×3 IMPLANT
DRAPE INCISE IOBAN 66X45 STRL (DRAPES) IMPLANT
DRAPE ORTHO SPLIT 77X108 STRL (DRAPES) ×6
DRAPE SURG ORHT 6 SPLT 77X108 (DRAPES) ×2 IMPLANT
DRAPE U-SHAPE 47X51 STRL (DRAPES) ×3 IMPLANT
DRSG MEPILEX BORDER 4X12 (GAUZE/BANDAGES/DRESSINGS) ×2 IMPLANT
DRSG MEPILEX BORDER 4X8 (GAUZE/BANDAGES/DRESSINGS) IMPLANT
DURAPREP 26ML APPLICATOR (WOUND CARE) ×3 IMPLANT
ELECT CAUTERY BLADE 6.4 (BLADE) ×3 IMPLANT
ELECT REM PT RETURN 9FT ADLT (ELECTROSURGICAL) ×3
ELECTRODE REM PT RTRN 9FT ADLT (ELECTROSURGICAL) ×1 IMPLANT
GLOVE BIOGEL PI ORTHO PRO SZ8 (GLOVE) ×4
GLOVE ORTHO TXT STRL SZ7.5 (GLOVE) ×3 IMPLANT
GLOVE PI ORTHO PRO STRL SZ8 (GLOVE) ×2 IMPLANT
GLOVE SURG ORTHO 8.0 STRL STRW (GLOVE) ×3 IMPLANT
GOWN STRL REUS W/ TWL XL LVL3 (GOWN DISPOSABLE) ×1 IMPLANT
GOWN STRL REUS W/TWL 2XL LVL3 (GOWN DISPOSABLE) ×3 IMPLANT
GOWN STRL REUS W/TWL XL LVL3 (GOWN DISPOSABLE) ×3
HOOD PEEL AWAY FACE SHEILD DIS (HOOD) ×6 IMPLANT
KIT BASIN OR (CUSTOM PROCEDURE TRAY) ×3 IMPLANT
KIT ROOM TURNOVER OR (KITS) ×3 IMPLANT
MANIFOLD NEPTUNE II (INSTRUMENTS) ×3 IMPLANT
NDL SAFETY ECLIPSE 18X1.5 (NEEDLE) ×1 IMPLANT
NEEDLE HYPO 18GX1.5 SHARP (NEEDLE) ×3
NS IRRIG 1000ML POUR BTL (IV SOLUTION) ×3 IMPLANT
PACK TOTAL JOINT (CUSTOM PROCEDURE TRAY) ×3 IMPLANT
PAD ARMBOARD 7.5X6 YLW CONV (MISCELLANEOUS) ×6 IMPLANT
PILLOW ABDUCTION HIP (SOFTGOODS) ×3 IMPLANT
PRESSURIZER FEMORAL UNIV (MISCELLANEOUS) IMPLANT
RETRIEVER SUT HEWSON (MISCELLANEOUS) ×3 IMPLANT
SUCTION FRAZIER HANDLE 10FR (MISCELLANEOUS) ×2
SUCTION TUBE FRAZIER 10FR DISP (MISCELLANEOUS) ×1 IMPLANT
SUT FIBERWIRE #2 38 REV NDL BL (SUTURE) ×9
SUT VIC AB 0 CT1 27 (SUTURE) ×3
SUT VIC AB 0 CT1 27XBRD ANBCTR (SUTURE) ×1 IMPLANT
SUT VIC AB 2-0 CT1 27 (SUTURE) ×3
SUT VIC AB 2-0 CT1 TAPERPNT 27 (SUTURE) ×1 IMPLANT
SUT VIC AB 3-0 SH 8-18 (SUTURE) ×3 IMPLANT
SUTURE FIBERWR#2 38 REV NDL BL (SUTURE) ×3 IMPLANT
SYR CONTROL 10ML LL (SYRINGE) ×3 IMPLANT
TOWEL OR 17X24 6PK STRL BLUE (TOWEL DISPOSABLE) ×3 IMPLANT
TOWEL OR 17X26 10 PK STRL BLUE (TOWEL DISPOSABLE) ×3 IMPLANT
TRAY CATH 16FR W/PLASTIC CATH (SET/KITS/TRAYS/PACK) IMPLANT
TRAY FOLEY CATH 14FR (SET/KITS/TRAYS/PACK) IMPLANT

## 2016-01-17 NOTE — Anesthesia Procedure Notes (Signed)
Procedure Name: MAC Date/Time: 01/17/2016 10:20 AM Performed by: Izora Gala Pre-anesthesia Checklist: Patient identified, Emergency Drugs available, Suction available and Patient being monitored Patient Re-evaluated:Patient Re-evaluated prior to inductionPlacement Confirmation: positive ETCO2

## 2016-01-17 NOTE — Progress Notes (Signed)
Report given to philip rn as caregiver 
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
## 2016-01-17 NOTE — Transfer of Care (Signed)
Immediate Anesthesia Transfer of Care Note  Patient: Summer [name]  Procedure(s) Performed: Procedure(s): TOTAL HIP ARTHROPLASTY (Left)  Patient Location: PACU  Anesthesia Type:Spinal  Level of Consciousness: awake, alert , oriented and patient cooperative  Airway & Oxygen Therapy: Patient Spontanous Breathing and Patient connected to nasal cannula oxygen  Post-op Assessment: Report given to RN, Post -op Vital signs reviewed and stable and Patient moving all extremities  Post vital signs: Reviewed and stable  Last Vitals:  Vitals:   01/17/16 0829  BP: (!) 151/64  Pulse: 84  Resp: 20  Temp: 37 C    Last Pain:  Vitals:   01/17/16 0829  TempSrc: Oral         Complications: No apparent anesthesia complications

## 2016-01-17 NOTE — Anesthesia Preprocedure Evaluation (Signed)
Anesthesia Evaluation  Patient identified by MRN, date of birth, ID band Patient awake    Reviewed: Allergy & Precautions, NPO status , Patient's Chart, lab work & pertinent test results  History of Anesthesia Complications (+) DIFFICULT AIRWAY and history of anesthetic complications  Airway Mallampati: IV  TM Distance: <3 FB Neck ROM: Full    Dental  (+) Teeth Intact   Pulmonary neg pulmonary ROS,    breath sounds clear to auscultation       Cardiovascular hypertension, Pt. on medications and Pt. on home beta blockers  Rhythm:Regular     Neuro/Psych  Headaches, PSYCHIATRIC DISORDERS Anxiety Depression    GI/Hepatic Neg liver ROS, GERD  ,  Endo/Other  Morbid obesity  Renal/GU negative Renal ROS     Musculoskeletal  (+) Arthritis ,   Abdominal   Peds  Hematology negative hematology ROS (+)   Anesthesia Other Findings   Reproductive/Obstetrics                             Anesthesia Physical Anesthesia Plan  ASA: III  Anesthesia Plan: Spinal   Post-op Pain Management:    Induction:   Airway Management Planned: Natural Airway, Nasal Cannula and Simple Face Mask  Additional Equipment: None  Intra-op Plan:   Post-operative Plan:   Informed Consent: I have reviewed the patients History and Physical, chart, labs and discussed the procedure including the risks, benefits and alternatives for the proposed anesthesia with the patient or authorized representative who has indicated his/her understanding and acceptance.   Dental advisory given  Plan Discussed with: CRNA and Surgeon  Anesthesia Plan Comments:         Anesthesia Quick Evaluation

## 2016-01-17 NOTE — Anesthesia Procedure Notes (Signed)
Spinal Patient location during procedure: OR Staffing Anesthesiologist: Cobain Morici Preanesthetic Checklist Completed: patient identified, surgical consent, pre-op evaluation, timeout performed, IV checked, risks and benefits discussed and monitors and equipment checked Spinal Block Patient position: sitting Prep: site prepped and draped and DuraPrep Patient monitoring: heart rate, cardiac monitor, continuous pulse ox and blood pressure Approach: midline Location: L3-4 Injection technique: single-shot Needle Needle type: Pencan  Needle gauge: 24 G Needle length: 10 cm Assessment Sensory level: T8   

## 2016-01-17 NOTE — Op Note (Signed)
01/17/2016  12:07 PM  PATIENT:  Summer [name]   MRN: [id number]  PRE-OPERATIVE DIAGNOSIS:  Avascular necrosis of bone of left hip ([place])  POST-OPERATIVE DIAGNOSIS:  Avascular necrosis of bone of left hip ([facility])  PROCEDURE:  Procedure(s): TOTAL HIP ARTHROPLASTY  PREOPERATIVE INDICATIONS:    Summer [name] is an 60 y.o. female who has a diagnosis of Avascular necrosis of bone of left hip ([place]) and elected for surgical management after failing conservative treatment.  The risks benefits and alternatives were discussed with the patient including but not limited to the risks of nonoperative treatment, versus surgical intervention including infection, bleeding, nerve injury, periprosthetic fracture, the need for revision surgery, dislocation, leg length discrepancy, blood clots, cardiopulmonary complications, morbidity, mortality, among others, and they were willing to proceed.     OPERATIVE REPORT     SURGEON:  Marchia Bond, MD    ASSISTANT:  Joya Gaskins, OPA-C  (Present throughout the entire procedure,  necessary for completion of procedure in a timely manner, assisting with retraction, instrumentation, and closure)     ANESTHESIA:  Spinal  EBL: 250 ml    COMPLICATIONS:  None.     UNIQUE ASPECTS OF THE CASE:  Although her body mass index was reportedly relatively low, Estimated body mass index is 36.54 kg/m as calculated from the following:   Height as of 01/05/16: 5' 5.5" (1.664 m).   Weight as of this encounter: 101.2 kg (223 lb).  She carried a lot of her weight in her hips, and it was a fairly deep hole. Nonetheless I did have adequate access to the acetabulum. The acetabulum did not have too much in way of deformity, the femoral head did have some collapse, with chondral delamination and evidence for avascular necrosis.   COMPONENTS:  [employer] fit femur size 5 with a 32 mm +1 head ball and a gription acetabular shell size 48 with an apex hole eliminator and a  neutral +4 polyethylene liner.    PROCEDURE IN DETAIL:   The patient was met in the holding area and  identified.  The appropriate hip was identified and marked at the operative site.  The patient was then transported to the OR  and  placed under anesthesia.  At that point, the patient was  placed in the lateral decubitus position with the operative side up and  secured to the operating room table and all bony prominences padded.     The operative lower extremity was prepped from the iliac crest to the distal leg.  Sterile draping was performed.  Time out was performed prior to incision.      A routine posterolateral approach was utilized via sharp dissection  carried down to the subcutaneous tissue.  Gross bleeders were Bovie coagulated.  The iliotibial band was identified and incised along the length of the skin incision.  Self-retaining retractors were  inserted.  With the hip internally rotated, the short external rotators  were identified. The piriformis and capsule was tagged with FiberWire, and the hip capsule released in a T-type fashion.  The femoral neck was exposed, and I resected the femoral neck using the appropriate jig. This was performed at approximately a thumb's breadth above the lesser trochanter.    I then exposed the deep acetabulum, cleared out any tissue including the ligamentum teres.  A wing retractor was placed.  After adequate visualization, I excised the labrum, and then sequentially reamed.  I placed the trial acetabulum, which seated nicely, and then  impacted the real cup into place.  Appropriate version and inclination was confirmed clinically matching their bony anatomy, and also with the use of the jig.  A trial polyethylene liner was placed and the wing retractor removed.    I then prepared the proximal femur using the cookie-cutter, the lateralizing reamer, and then sequentially reamed and broached.  A trial broach, neck, and head was utilized, and I reduced the  hip and it was found to have excellent stability with functional range of motion. The trial components were then removed, and the real polyethylene liner was placed.  I then impacted the real femoral prosthesis into place into the appropriate version, slightly anteverted to the normal anatomy, and I impacted the real head ball into place. The hip was then reduced and taken through functional range of motion and found to have excellent stability. Leg lengths were restored.  I then used a 2 mm drill bits to pass the FiberWire suture from the capsule and piriformis through the greater trochanter, and secured this. Excellent posterior capsular repair was achieved. I also closed the T in the capsule.  I then irrigated the hip copiously again with pulse lavage, and repaired the fascia with Vicryl, followed by Vicryl for the subcutaneous tissue, Monocryl for the skin, Steri-Strips and sterile gauze. The wounds were injected. The patient was then awakened and returned to PACU in stable and satisfactory condition. There were no complications.  Marchia Bond, MD Orthopedic Surgeon [phone]   01/17/2016 12:07 PM

## 2016-01-18 ENCOUNTER — Encounter (HOSPITAL_COMMUNITY): Payer: Self-pay | Admitting: Orthopedic Surgery

## 2016-01-18 DIAGNOSIS — D62 Acute posthemorrhagic anemia: Secondary | ICD-10-CM | POA: Diagnosis not present

## 2016-01-18 LAB — ABO/RH: ABO/RH(D): O POS

## 2016-01-18 LAB — BASIC METABOLIC PANEL
Anion gap: 7 (ref 5–15)
BUN: 14 mg/dL (ref 6–20)
CO2: 23 mmol/L (ref 22–32)
Calcium: 8.1 mg/dL — ABNORMAL LOW (ref 8.9–10.3)
Chloride: 103 mmol/L (ref 101–111)
Creatinine, Ser: 1.08 mg/dL — ABNORMAL HIGH (ref 0.44–1.00)
GFR calc Af Amer: >60 mL/min (ref >60)
GFR, EST NON AFRICAN AMERICAN: 55 mL/min — AB (ref >60)
GLUCOSE: 131 mg/dL — AB (ref 65–99)
POTASSIUM: 4.4 mmol/L (ref 3.5–5.1)
Sodium: 133 mmol/L — ABNORMAL LOW (ref 135–145)

## 2016-01-18 LAB — CBC
HCT: 23.8 % — ABNORMAL LOW (ref 36.0–46.0)
Hemoglobin: 7.7 g/dL — ABNORMAL LOW (ref 12.0–15.0)
MCH: 28.8 pg (ref 26.0–34.0)
MCHC: 32.4 g/dL (ref 30.0–36.0)
MCV: 89.1 fL (ref 78.0–100.0)
PLATELETS: 174 10*3/uL (ref 150–400)
RBC: 2.67 MIL/uL — AB (ref 3.87–5.11)
RDW: 14.3 % (ref 11.5–15.5)
WBC: 5.7 10*3/uL (ref 4.0–10.5)

## 2016-01-18 LAB — PREPARE RBC (CROSSMATCH)

## 2016-01-18 MED ORDER — DIPHENHYDRAMINE HCL 25 MG PO CAPS
25.0000 mg | ORAL_CAPSULE | Freq: Once | ORAL | Status: AC
  Administered 2016-01-18: 25 mg via ORAL
  Filled 2016-01-18: qty 1

## 2016-01-18 MED ORDER — ACETAMINOPHEN 325 MG PO TABS
650.0000 mg | ORAL_TABLET | Freq: Once | ORAL | Status: AC
  Administered 2016-01-18: 650 mg via ORAL
  Filled 2016-01-18: qty 2

## 2016-01-18 MED ORDER — SODIUM CHLORIDE 0.9 % IV SOLN
Freq: Once | INTRAVENOUS | Status: AC
  Administered 2016-01-18: 15:00:00 via INTRAVENOUS

## 2016-01-18 NOTE — Care Management Note (Signed)
Case Management Note  Patient Details  Name: Summer [name] MRN: [id number] Date of Birth: 05-14-1955  Subjective/Objective:   60 yr old female s/p left total hip arthroplasty, posterior approach.                 Action/Plan: Case manager spoke with patient and family concerning [place] and DME needs. Patient was preoperatively setup with [facility], no changes. RW and 3in1 have been delivered to patient's room. She will have family support at discharge.   Expected Discharge Date:   01/20/16               Expected Discharge Plan:  [place]  In-House Referral:  NA  Discharge planning Services  CM Consult  Post Acute Care Choice:  Home Health Choice offered to:  Patient  DME Arranged:  3-N-1, Walker rolling DME Agency:     HH Arranged:  PT, OT HH Agency:  [facility] (now [facility])  Status of Service:  Completed, signed off  If discussed at [place] of Stay Meetings, dates discussed:    Additional Comments:  Ninfa Meeker, RN 01/18/2016, 4:13 PM

## 2016-01-18 NOTE — Evaluation (Addendum)
Occupational Therapy Evaluation Patient Details Name: Summer [name] MRN: [id number] DOB: 08-15-1955 Today's Date: 01/18/2016    History of Present Illness Pt is a 60 y.o. female s/p L total hip arthroplasty with posterior total hip precautions. Pt has a past medical history significant for anxiety, blidness of L eye, depression, GERD, hyperluipidemia, hypertension, lumbar vertebral fracture, obesity, retinal detachment, and tachycardia.   Clinical Impression   PTA, pt was independent with assistive devices using a cane for functional mobility and a RW occasionally for "bad days" per pt report. Pt currently requires max assist with LB ADL to adhere to posterior hip precautions, min guard assist for balance for UB ADL seated at EOB. Pt limited this session by lightheadedness/nausea and feeling like she would pass out on stand pivot transfer to [facility]. Initially pt requiring min assist for stand-pivot transfer to [facility] but mod assist +2 for stand pivot back to bed once becoming dizzy. BP 114/49 supine at rest and 101/41 after sitting EOB. Unable to record BP on standing due to need to return pt to supine position in bed. Lightheadedness/nausea resolved with supine positioning and BP at 113/43 resting in supine. Pt would benefit from continued OT services while admitted to improve independence with ADL and functional mobility to ensure safe D/C home with follow-up as detailed below. OT will continue to follow acutely. Recommend 3-in-1 BSC for DME needs.    Follow Up Recommendations  Home health OT;Supervision/Assistance - 24 hour    Equipment Recommendations  3 in 1 bedside comode    Recommendations for Other Services       Precautions / Restrictions Precautions Precautions: Posterior Hip Precaution Booklet Issued: Yes (comment) Precaution Comments: reviewed posterior hip precautions and use of abduction pillow Restrictions Weight Bearing Restrictions: Yes LLE Weight Bearing: Weight bearing as  tolerated Other Position/Activity Restrictions: posterior hip precautions      Mobility Bed Mobility Overal bed mobility: Needs Assistance Bed Mobility: Supine to Sit;Sit to Supine     Supine to sit: Mod assist Sit to supine: Max assist;+2 for physical assistance;+2 for safety/equipment   General bed mobility comments: Initially required mod assist to go supine to sit. Pt becoming lightheaded while seated on BSC and required max assist +2 to return to supine from sitting EOB.  Transfers Overall transfer level: Needs assistance Equipment used: Rolling walker (2 wheeled) Transfers: Sit to/from Stand Sit to Stand: Min assist;Mod assist;+2 safety/equipment         General transfer comment: Initially min assist with therapist, however after becoming lightheaded on [facility] required mod assist +2 from therapist and nurse tech.    Balance Overall balance assessment: Needs assistance Sitting-balance support: Single extremity supported;Feet supported Sitting balance-Leahy Scale: Fair     Standing balance support: Bilateral upper extremity supported;During functional activity Standing balance-Leahy Scale: Poor                              ADL Overall ADL's : Needs assistance/impaired     Grooming: Sitting;Min guard   Upper Body Bathing: Min guard;Sitting   Lower Body Bathing: Maximal assistance;Sit to/from stand   Upper Body Dressing : Min guard;Sitting   Lower Body Dressing: Maximal assistance;Sit to/from stand   Toilet Transfer: Minimal assistance;Moderate assistance;+2 for safety/equipment Toilet Transfer Details (indicate cue type and reason): Pt initially requiring min assist to stand-pivot to [facility] but became lightheaded and feeling like she would pass out. Nurse tech assisted therapist with mod assist +2  to return to bed as pt unable to recline on BSC. Toileting- Clothing Manipulation and Hygiene: Maximal assistance;Sit to/from stand         General ADL  Comments: Limited to stand-pivot to [facility] due to pain and lightheadedness following transfer to [facility].     Vision Vision Assessment?:  (Diplopia at baseline, blindness in L eye, sensitive to light)   Perception     Praxis      Pertinent Vitals/Pain Pain Assessment: Faces Faces Pain Scale: Hurts whole lot Pain Location: L hip Pain Descriptors / Indicators: Aching;Operative site guarding;Sore Pain Intervention(s): Limited activity within patient's tolerance;Monitored during session;Repositioned     Hand Dominance Right   Extremity/Trunk Assessment Upper Extremity Assessment Upper Extremity Assessment: Overall WFL for tasks assessed   Lower Extremity Assessment Lower Extremity Assessment: LLE deficits/detail LLE Deficits / Details: Decreased strength and ROM as expected post-operatively.       Communication Communication Communication: No difficulties   Cognition Arousal/Alertness: Awake/alert Behavior During Therapy: WFL for tasks assessed/performed Overall Cognitive Status: Within Functional Limits for tasks assessed                     General Comments       Exercises       Shoulder Instructions      Home Living Family/patient expects to be discharged to:: Private residence Living Arrangements: Children Available Help at Discharge: Family;Available 24 hours/day Type of Home: Apartment Home Access: Stairs to enter Entrance Stairs-Number of Steps: 13 Entrance Stairs-Rails: Right;Left;Can reach both Home Layout: One level     Bathroom Shower/Tub: Tub/shower unit Shower/tub characteristics: Architectural technologist: Standard Bathroom Accessibility: Yes How Accessible: Accessible via walker Home Equipment: None (Was borrowing cane and walker)   Additional Comments: Pt lives with son. Will have 24 hour assistance from neice while recovering.      Prior Functioning/Environment Level of Independence: Independent with assistive device(s)         Comments: Using cane for functional ambulation and RW at times on "bad days"        OT Problem List: Decreased strength;Decreased range of motion;Decreased activity tolerance;Impaired balance (sitting and/or standing);Decreased knowledge of use of DME or AE;Decreased knowledge of precautions;Decreased safety awareness;Pain   OT Treatment/Interventions: Self-care/ADL training;Therapeutic exercise;Energy conservation;Therapeutic activities;Patient/family education;Balance training    OT Goals(Current goals can be found in the care plan section) Acute Rehab OT Goals Patient Stated Goal: to go home OT Goal Formulation: With patient Time For Goal Achievement: 02/01/16 Potential to Achieve Goals: Good ADL Goals Pt Will Perform Lower Body Bathing: with adaptive equipment;sit to/from stand;with supervision Pt Will Perform Lower Body Dressing: sit to/from stand;with supervision Pt Will Transfer to Toilet: with supervision;ambulating Pt Will Perform Toileting - Clothing Manipulation and hygiene: sit to/from stand;with supervision Pt Will Perform Tub/Shower Transfer: Tub transfer;3 in 1;rolling walker;ambulating;with min guard assist  OT Frequency: Min 2X/week   Barriers to D/C:            Co-evaluation              End of Session Equipment Utilized During Treatment: Gait belt;Rolling walker Nurse Communication: Mobility status  Activity Tolerance: Treatment limited secondary to medical complications (Comment) (Dizziness, feeling like she would pass out) Patient left: in bed;with call bell/phone within reach;with SCD's reapplied   Time: 0900-0940 OT Time Calculation (min): 40 min Charges:  OT General Charges $OT Visit: 1 Procedure OT Evaluation $OT Eval Moderate Complexity: 1 Procedure OT Treatments $Self Care/Home Management : 23-37  mins  Norman Herrlich, OTR/L [id number] 01/18/2016, 10:50 AM

## 2016-01-18 NOTE — Progress Notes (Signed)
Patient ID: Summer [name], female   DOB: Jun 16, 1955, 60 y.o.   MRN: [id number]     Subjective:  Patient reports pain as mild.  Patient in bed and in no acute distress.  Reports nausea but denies CP or ABD pain also denies any SOB  Objective:   VITALS:   Vitals:   01/17/16 2124 01/17/16 2129 01/18/16 0145 01/18/16 0700  BP: (!) 135/46 (!) 107/43 (!) 109/46 (!) 115/46  Pulse: 78  90 87  Resp: 16  16 16   Temp: 98.2 F (36.8 C)  99 F (37.2 C) 99 F (37.2 C)  TempSrc: Oral  Oral Oral  SpO2: 96%  97% 96%  Weight:        ABD soft Sensation intact distally Dorsiflexion/Plantar flexion intact Incision: dressing C/D/I and no drainage Good foot and ankle motion No pain in ABD  Lab Results  Component Value Date   WBC 5.7 01/18/2016   HGB 7.7 (L) 01/18/2016   HCT 23.8 (L) 01/18/2016   MCV 89.1 01/18/2016   PLT 174 01/18/2016   BMET    Component Value Date/Time   NA 133 (L) 01/18/2016 0545   NA 140 08/08/2015 1056   K 4.4 01/18/2016 0545   CL 103 01/18/2016 0545   CO2 23 01/18/2016 0545   GLUCOSE 131 (H) 01/18/2016 0545   BUN 14 01/18/2016 0545   BUN 15 08/08/2015 1056   CREATININE 1.08 (H) 01/18/2016 0545   CALCIUM 8.1 (L) 01/18/2016 0545   GFRNONAA 55 (L) 01/18/2016 0545   GFRAA >60 01/18/2016 0545     Assessment/Plan: 1 Day Post-Op   Principal Problem:   Avascular necrosis of bone of left hip ([facility]CC) Active Problems:   Obesity (BMI 30-39.9)   S/P total hip arthroplasty   Advance diet Up with therapy Plan for discharge tomorrow or Friday WBAT Dry dressing PRN   Remonia Richter 01/18/2016, 7:45 AM  Seen and agree with above. ABLA symptomatic, will transfuse.    Marchia Bond, MD Cell [phone]

## 2016-01-18 NOTE — Anesthesia Postprocedure Evaluation (Signed)
Anesthesia Post Note  Patient: Summer [name]  Procedure(s) Performed: Procedure(s) (LRB): TOTAL HIP ARTHROPLASTY (Left)  Patient location during evaluation: PACU Anesthesia Type: Spinal Level of consciousness: awake Pain management: pain level controlled Vital Signs Assessment: post-procedure vital signs reviewed and stable Respiratory status: spontaneous breathing Cardiovascular status: stable Postop Assessment: no signs of nausea or vomiting and spinal receding Anesthetic complications: no    Last Vitals:  Vitals:   01/18/16 0145 01/18/16 0700  BP: (!) 109/46 (!) 115/46  Pulse: 90 87  Resp: 16 16  Temp: 37.2 C 37.2 C    Last Pain:  Vitals:   01/18/16 0700  TempSrc: Oral  PainSc:                  Efton Thomley

## 2016-01-18 NOTE — Evaluation (Signed)
Physical Therapy Evaluation Patient Details Name: Summer [name] MRN: [id number] DOB: 02-Feb-1956 Today's Date: 01/18/2016   History of Present Illness  Pt is a 60 y.o. female s/p L total hip arthroplasty with posterior total hip precautions. Pt has a past medical history significant for anxiety, blidness of L eye, depression, GERD, hyperluipidemia, hypertension, lumbar vertebral fracture, obesity, retinal detachment, and tachycardia.  Clinical Impression  Pt is s/p THA resulting in the deficits listed below (see PT Problem List).  Pt will benefit from skilled PT to increase their independence and safety with mobility to allow discharge to the venue listed below.  Pt reeducated on posterior hip precautions.  Pt reports nausea and dizziness with OT earlier however agreeable to attempt mobility again.  Pt not dizzy until taking a couple steps requiring assist to chair.  Deferred ambulation at this time for safety.       Follow Up Recommendations Home health PT;Supervision/Assistance - 24 hour    Equipment Recommendations  Rolling walker with 5" wheels    Recommendations for Other Services       Precautions / Restrictions Precautions Precautions: Posterior Hip Precaution Booklet Issued: Yes (comment) Precaution Comments: pt able to recall 2/3 posterior hip precautions so reviewed  Restrictions Weight Bearing Restrictions: Yes LLE Weight Bearing: Weight bearing as tolerated Other Position/Activity Restrictions: posterior hip precautions      Mobility  Bed Mobility Overal bed mobility: Needs Assistance Bed Mobility: Supine to Sit     Supine to sit: Mod assist;HOB elevated Sit to supine: Max assist;+2 for physical assistance;+2 for safety/equipment   General bed mobility comments: verbal cues for self assist and technique, assist required for L LE and trunk  Transfers Overall transfer level: Needs assistance Equipment used: Rolling walker (2 wheeled) Transfers: Sit to/from  [facility] Sit to Stand: Mod assist Stand pivot transfers: Mod assist       General transfer comment: verbal cues for safety and technique, pt not dizzy upon standing however felt very dizzy with a couple steps forward so chair brought behind pt, pt with dry heaves and dizzy, short rest break until pt feeling a little better then pt assisted from chair to recliner with stand pivot, cues for maintaining precautions  Ambulation/Gait                Stairs            Wheelchair Mobility    Modified Rankin (Stroke Patients Only)       Balance Overall balance assessment: Needs assistance Sitting-balance support: Single extremity supported;Feet supported Sitting balance-Leahy Scale: Fair     Standing balance support: Bilateral upper extremity supported;During functional activity Standing balance-Leahy Scale: Poor                               Pertinent Vitals/Pain Pain Assessment: 0-10 Pain Score: 7  Faces Pain Scale: Hurts whole lot Pain Location: L hip Pain Descriptors / Indicators: Aching;Sore Pain Intervention(s): Limited activity within patient's tolerance;Monitored during session;Repositioned    Home Living Family/patient expects to be discharged to:: Private residence Living Arrangements: Children Available Help at Discharge: Family;Available 24 hours/day Type of Home: Apartment Home Access: Stairs to enter Entrance Stairs-Rails: Right;Left;Can reach both Entrance Stairs-Number of Steps: 13 Home Layout: One level Home Equipment: None (Was borrowing cane and walker) Additional Comments: Pt lives with son. Will have 24 hour assistance from niece while recovering.    Prior Function Level of Independence:  Independent with assistive device(s)         Comments: Using cane for functional ambulation and RW at times on "bad days"     Hand Dominance   Dominant Hand: Right    Extremity/Trunk Assessment   Upper Extremity  Assessment: Overall WFL for tasks assessed           Lower Extremity Assessment: LLE deficits/detail   LLE Deficits / Details: decreased functional hip strength     Communication   Communication: No difficulties  Cognition Arousal/Alertness: Awake/alert Behavior During Therapy: WFL for tasks assessed/performed Overall Cognitive Status: Within Functional Limits for tasks assessed                      General Comments      Exercises     Assessment/Plan    PT Assessment Patient needs continued PT services  PT Problem List Decreased strength;Decreased range of motion;Decreased activity tolerance;Decreased mobility;Pain;Decreased knowledge of use of DME;Decreased knowledge of precautions          PT Treatment Interventions Functional mobility training;Stair training;Gait training;DME instruction;Therapeutic activities;Therapeutic exercise;Patient/family education    PT Goals (Current goals can be found in the Care Plan section)  Acute Rehab PT Goals Patient Stated Goal: to go home PT Goal Formulation: With patient Time For Goal Achievement: 01/23/16 Potential to Achieve Goals: Good    Frequency 7X/week   Barriers to discharge        Co-evaluation               End of Session Equipment Utilized During Treatment: Gait belt Activity Tolerance: Other (comment) (limited by nausea and dizziness) Patient left: in chair;with nursing/sitter in room;with call bell/phone within reach           Time: 1143-1202 PT Time Calculation (min) (ACUTE ONLY): 19 min   Charges:   PT Evaluation $PT Eval Low Complexity: 1 Procedure     PT G Codes:        Summer [name],Summer [name] 01/18/2016, 12:51 PM Carmelia Bake, PT, DPT 01/18/2016 Pager: [phone]

## 2016-01-19 LAB — CBC
HEMATOCRIT: 29.3 % — AB (ref 36.0–46.0)
Hemoglobin: 9.7 g/dL — ABNORMAL LOW (ref 12.0–15.0)
MCH: 28.7 pg (ref 26.0–34.0)
MCHC: 33.1 g/dL (ref 30.0–36.0)
MCV: 86.7 fL (ref 78.0–100.0)
Platelets: 148 10*3/uL — ABNORMAL LOW (ref 150–400)
RBC: 3.38 MIL/uL — ABNORMAL LOW (ref 3.87–5.11)
RDW: 15.4 % (ref 11.5–15.5)
WBC: 9 10*3/uL (ref 4.0–10.5)

## 2016-01-19 LAB — BASIC METABOLIC PANEL
ANION GAP: 9 (ref 5–15)
BUN: 13 mg/dL (ref 6–20)
CALCIUM: 8.5 mg/dL — AB (ref 8.9–10.3)
CO2: 21 mmol/L — AB (ref 22–32)
Chloride: 103 mmol/L (ref 101–111)
Creatinine, Ser: 1.06 mg/dL — ABNORMAL HIGH (ref 0.44–1.00)
GFR calc Af Amer: >60 mL/min (ref >60)
GFR calc non Af Amer: 57 mL/min — ABNORMAL LOW (ref >60)
GLUCOSE: 127 mg/dL — AB (ref 65–99)
Potassium: 4.2 mmol/L (ref 3.5–5.1)
Sodium: 133 mmol/L — ABNORMAL LOW (ref 135–145)

## 2016-01-19 LAB — TYPE AND SCREEN
BLOOD PRODUCT EXPIRATION DATE: 201712162359
Blood Product Expiration Date: 201712182359
ISSUE DATE / TIME: 201711291539
ISSUE DATE / TIME: 201711292026
Unit Type and Rh: 5100
Unit Type and Rh: 5100

## 2016-01-19 NOTE — Progress Notes (Signed)
   Assessment: 2 Days Post-Op  S/P Procedure(s) (LRB): TOTAL HIP ARTHROPLASTY (Left) by Dr. Mardelle Matte on 01/17/16  Principal Problem:   Avascular necrosis of bone of left hip ([place]) Active Problems:   Obesity (BMI 30-39.9)   S/P total hip arthroplasty   Postoperative anemia due to acute blood loss  S/p transfusion yesterday.  CBC pending.  Plan: Up with therapy Pulmonary Toilet / IS discussed Posterior hip precautions reviewed  Weight Bearing: Weight Bearing as Tolerated (WBAT) left leg Dressings: Mepilex.  VTE prophylaxis: Xarelto, SCDs, ambulation Dispo: Home.  Planning for tomorrow.    Subjective: Feeling better with more energy today after transfusion.  Pain controlled.  No dizziness, but has not been OOB yet today.  Appetite not great with some nausea still.  No CP or SOB.  Objective:   VITALS:   Vitals:   01/18/16 1606 01/18/16 2010 01/18/16 2054 01/19/16 0550  BP:  (!) 102/48 (!) 106/46 (!) 115/46  Pulse: 87 86 84 80  Resp:  18 16 16   Temp: 99.2 F (37.3 C) 99.8 F (37.7 C) 99.6 F (37.6 C) 98.9 F (37.2 C)  TempSrc: Oral Oral Oral Oral  SpO2: 93% 98% 97% 93%  Weight:       CBC Latest Ref Rng & Units 01/18/2016 01/05/2016 08/08/2015  WBC 4.0 - 10.5 K/uL 5.7 5.0 4.9  Hemoglobin 12.0 - 15.0 g/dL 7.7(L) 11.1(L) -  Hematocrit 36.0 - 46.0 % 23.8(L) 34.7(L) 30.1(L)  Platelets 150 - 400 K/uL 174 252 263   BMP Latest Ref Rng & Units 01/18/2016 01/05/2016 08/08/2015  Glucose 65 - 99 mg/dL 131(H) 107(H) 100(H)  BUN 6 - 20 mg/dL 14 12 15   Creatinine 0.44 - 1.00 mg/dL 1.08(H) 1.04(H) 1.13(H)  BUN/Creat Ratio 9 - 23 - - 13  Sodium 135 - 145 mmol/L 133(L) 135 140  Potassium 3.5 - 5.1 mmol/L 4.4 4.5 4.8  Chloride 101 - 111 mmol/L 103 104 103  CO2 22 - 32 mmol/L 23 24 22   Calcium 8.9 - 10.3 mg/dL 8.1(L) 9.3 9.4   Intake/Output      11/29 0701 - 11/30 0700   P.O. 600   Blood 300   Total Intake(mL/kg) 900 (8.9)   Net +900       Urine Occurrence 3 x     Physical  Exam: General: NAD.  Supine in bed Resp: No increased wob.  CTA b/l at bases. Cardio: regular rate and rhythm ABD protuberant, soft Neurologically intact, conversant. MSK Feet warm Neurovascularly intact Sensation intact distally Dorsiflexion/Plantar flexion intact Incision: dressing C/D/I   Prudencio Burly III 01/19/2016, 6:55 AM

## 2016-01-19 NOTE — Discharge Instructions (Addendum)
INSTRUCTIONS AFTER JOINT REPLACEMENT  ° °o Remove items at home which could result in a fall. This includes throw rugs or furniture in walking pathways °o ICE to the affected joint every three hours while awake for 30 minutes at a time, for at least the first 3-5 days, and then as needed for pain and swelling.  Continue to use ice for pain and swelling. You may notice swelling that will progress down to the foot and ankle.  This is normal after surgery.  Elevate your leg when you are not up walking on it.   °o Continue to use the breathing machine you got in the hospital (incentive spirometer) which will help keep your temperature down.  It is common for your temperature to cycle up and down following surgery, especially at night when you are not up moving around and exerting yourself.  The breathing machine keeps your lungs expanded and your temperature down. ° ° °DIET:  As you were doing prior to hospitalization, we recommend a well-balanced diet. ° °DRESSING / WOUND CARE / SHOWERING ° °You may change your dressing 3-5 days after surgery.  Then change the dressing every day with sterile gauze.  Please use good hand washing techniques before changing the dressing.  Do not use any lotions or creams on the incision until instructed by your surgeon. ° °ACTIVITY ° °o Increase activity slowly as tolerated, but follow the weight bearing instructions below.   °o No driving for 6 weeks or until further direction given by your physician.  You cannot drive while taking narcotics.  °o No lifting or carrying greater than 10 lbs. until further directed by your surgeon. °o Avoid periods of inactivity such as sitting longer than an hour when not asleep. This helps prevent blood clots.  °o You may return to work once you are authorized by your doctor.  ° ° ° °WEIGHT BEARING  ° °Weight bearing as tolerated with assist device (walker, cane, etc) as directed, use it as long as suggested by your surgeon or therapist, typically at  least 4-6 weeks. ° ° °EXERCISES ° °Results after joint replacement surgery are often greatly improved when you follow the exercise, range of motion and muscle strengthening exercises prescribed by your doctor. Safety measures are also important to protect the joint from further injury. Any time any of these exercises cause you to have increased pain or swelling, decrease what you are doing until you are comfortable again and then slowly increase them. If you have problems or questions, call your caregiver or physical therapist for advice.  ° °Rehabilitation is important following a joint replacement. After just a few days of immobilization, the muscles of the leg can become weakened and shrink (atrophy).  These exercises are designed to build up the tone and strength of the thigh and leg muscles and to improve motion. Often times heat used for twenty to thirty minutes before working out will loosen up your tissues and help with improving the range of motion but do not use heat for the first two weeks following surgery (sometimes heat can increase post-operative swelling).  ° °These exercises can be done on a training (exercise) mat, on the floor, on a table or on a bed. Use whatever works the best and is most comfortable for you.    Use music or television while you are exercising so that the exercises are a pleasant break in your day. This will make your life better with the exercises acting as a break   in your routine that you can look forward to.   Perform all exercises about fifteen times, three times per day or as directed.  You should exercise both the operative leg and the other leg as well. ° °Exercises include: °  °• Quad Sets - Tighten up the muscle on the front of the thigh (Quad) and hold for 5-10 seconds.   °• Straight Leg Raises - With your knee straight (if you were given a brace, keep it on), lift the leg to 60 degrees, hold for 3 seconds, and slowly lower the leg.  Perform this exercise against  resistance later as your leg gets stronger.  °• Leg Slides: Lying on your back, slowly slide your foot toward your buttocks, bending your knee up off the floor (only go as far as is comfortable). Then slowly slide your foot back down until your leg is flat on the floor again.  °• Angel Wings: Lying on your back spread your legs to the side as far apart as you can without causing discomfort.  °• Hamstring Strength:  Lying on your back, push your heel against the floor with your leg straight by tightening up the muscles of your buttocks.  Repeat, but this time bend your knee to a comfortable angle, and push your heel against the floor.  You may put a pillow under the heel to make it more comfortable if necessary.  ° °A rehabilitation program following joint replacement surgery can speed recovery and prevent re-injury in the future due to weakened muscles. Contact your doctor or a physical therapist for more information on knee rehabilitation.  ° ° °CONSTIPATION ° °Constipation is defined medically as fewer than three stools per week and severe constipation as less than one stool per week.  Even if you have a regular bowel pattern at home, your normal regimen is likely to be disrupted due to multiple reasons following surgery.  Combination of anesthesia, postoperative narcotics, change in appetite and fluid intake all can affect your bowels.  ° °YOU MUST use at least one of the following options; they are listed in order of increasing strength to get the job done.  They are all available over the counter, and you may need to use some, POSSIBLY even all of these options:   ° °Drink plenty of fluids (prune juice may be helpful) and high fiber foods °Colace 100 mg by mouth twice a day  °Senokot for constipation as directed and as needed Dulcolax (bisacodyl), take with full glass of water  °Miralax (polyethylene glycol) once or twice a day as needed. ° °If you have tried all these things and are unable to have a bowel  movement in the first 3-4 days after surgery call either your surgeon or your primary doctor.   ° °If you experience loose stools or diarrhea, hold the medications until you stool forms back up.  If your symptoms do not get better within 1 week or if they get worse, check with your doctor.  If you experience "the worst abdominal pain ever" or develop nausea or vomiting, please contact the office immediately for further recommendations for treatment. ° ° °ITCHING:  If you experience itching with your medications, try taking only a single pain pill, or even half a pain pill at a time.  You can also use Benadryl over the counter for itching or also to help with sleep.  ° °TED HOSE STOCKINGS:  Use stockings on both legs until for at least 2 weeks or as   directed by physician office. They may be removed at night for sleeping. ° °MEDICATIONS:  See your medication summary on the “After Visit Summary” that nursing will review with you.  You may have some home medications which will be placed on hold until you complete the course of blood thinner medication.  It is important for you to complete the blood thinner medication as prescribed. ° °PRECAUTIONS:  If you experience chest pain or shortness of breath - call 911 immediately for transfer to the hospital emergency department.  ° °If you develop a fever greater that 101 F, purulent drainage from wound, increased redness or drainage from wound, foul odor from the wound/dressing, or calf pain - CONTACT YOUR SURGEON.   °                                                °FOLLOW-UP APPOINTMENTS:  If you do not already have a post-op appointment, please call the office for an appointment to be seen by your surgeon.  Guidelines for how soon to be seen are listed in your “After Visit Summary”, but are typically between 1-4 weeks after surgery. ° °OTHER INSTRUCTIONS:  ° °Knee Replacement:  Do not place pillow under knee, focus on keeping the knee straight while resting. CPM  instructions: 0-90 degrees, 2 hours in the morning, 2 hours in the afternoon, and 2 hours in the evening. Place foam block, curve side up under heel at all times except when in CPM or when walking.  DO NOT modify, tear, cut, or change the foam block in any way. ° °MAKE SURE YOU:  °• Understand these instructions.  °• Get help right away if you are not doing well or get worse.  ° ° °Thank you for letting us be a part of your medical care team.  It is a privilege we respect greatly.  We hope these instructions will help you stay on track for a fast and full recovery!  ° ° ° °Information on my medicine - XARELTO® (Rivaroxaban) ° °This medication education was reviewed with me or my healthcare representative as part of my discharge preparation.  The pharmacist that spoke with me during my hospital stay was:  Jessah Danser Dien, RPH ° °Why was Xarelto® prescribed for you? °Xarelto® was prescribed for you to reduce the risk of blood clots forming after orthopedic surgery. The medical term for these abnormal blood clots is venous thromboembolism (VTE). ° °What do you need to know about xarelto® ? °Take your Xarelto® ONCE DAILY at the same time every day. °You may take it either with or without food. ° °If you have difficulty swallowing the tablet whole, you may crush it and mix in applesauce just prior to taking your dose. ° °Take Xarelto® exactly as prescribed by your doctor and DO NOT stop taking Xarelto® without talking to the doctor who prescribed the medication.  Stopping without other VTE prevention medication to take the place of Xarelto® may increase your risk of developing a clot. ° °After discharge, you should have regular check-up appointments with your healthcare provider that is prescribing your Xarelto®.   ° °What do you do if you miss a dose? °If you miss a dose, take it as soon as you remember on the same day then continue your regularly scheduled once daily regimen the next day. Do not take two   doses of  Xarelto® on the same day.  ° °Important Safety Information °A possible side effect of Xarelto® is bleeding. You should call your healthcare provider right away if you experience any of the following: °? Bleeding from an injury or your nose that does not stop. °? Unusual colored urine (red or dark brown) or unusual colored stools (red or black). °? Unusual bruising for unknown reasons. °? A serious fall or if you hit your head (even if there is no bleeding). ° °Some medicines may interact with Xarelto® and might increase your risk of bleeding while on Xarelto®. To help avoid this, consult your healthcare provider or pharmacist prior to using any new prescription or non-prescription medications, including herbals, vitamins, non-steroidal anti-inflammatory drugs (NSAIDs) and supplements. ° °This website has more information on Xarelto®: www.xarelto.com. ° ° ° ° °

## 2016-01-19 NOTE — Progress Notes (Signed)
Physical Therapy Treatment Patient Details Name: Summer [name] MRN: [id number] DOB: 05-22-1955 Today's Date: 01/19/2016    History of Present Illness Pt is a 60 y.o. female s/p L total hip arthroplasty with posterior total hip precautions. Pt has a past medical history significant for anxiety, blidness of L eye, depression, GERD, hyperluipidemia, hypertension, lumbar vertebral fracture, obesity, retinal detachment, and tachycardia.    PT Comments    Patient is progressing toward mobility goals. Patient needs to practice stairs next session as she has 13 to enter home. Continue to progress as tolerated with anticipated d/c home with HHPT.   Follow Up Recommendations  Home health PT;Supervision/Assistance - 24 hour     Equipment Recommendations  Rolling walker with 5" wheels    Recommendations for Other Services       Precautions / Restrictions Precautions Precautions: Posterior Hip Precaution Comments: pt able to recall 3/3 posterior hip precautions   Restrictions Weight Bearing Restrictions: Yes LLE Weight Bearing: Weight bearing as tolerated Other Position/Activity Restrictions: posterior hip precautions    Mobility  Bed Mobility Overal bed mobility: Needs Assistance Bed Mobility: Supine to Sit;Sit to Supine     Supine to sit: HOB elevated;Min assist Sit to supine: Mod assist;+2 for physical assistance   General bed mobility comments: assist to bring L LE to EOB and scoot hips to EOB with use of bed pad; increased time and effort and cues for sequencing/technique; HOB flat and no use of rails; assist to bring L LE into bed and lower trunk slowly while maintaining hip precautions  Transfers Overall transfer level: Needs assistance Equipment used: Rolling walker (2 wheeled) Transfers: Sit to/from Stand Sit to Stand: Min assist         General transfer comment: assist to power up into standing from EOB and recliner; cues for hand  placement  Ambulation/Gait Ambulation/Gait assistance: Min guard Ambulation Distance (Feet): 35 Feet Assistive device: Rolling walker (2 wheeled) Gait Pattern/deviations: Decreased stance time - left;Decreased step length - left;Decreased step length - right;Decreased weight shift to left;Antalgic Gait velocity: decreased   General Gait Details: cues for WBAT L LE as pt was ambulating on L forefoot initally and cues for sequencing and posture   Stairs            Wheelchair Mobility    Modified Rankin (Stroke Patients Only)       Balance Overall balance assessment: Needs assistance   Sitting balance-Leahy Scale: Fair       Standing balance-Leahy Scale: Poor                      Cognition Arousal/Alertness: Awake/alert Behavior During Therapy: WFL for tasks assessed/performed Overall Cognitive Status: Within Functional Limits for tasks assessed                      Exercises      General Comments General comments (skin integrity, edema, etc.): pt reported dizziness in sitting/standing and VSS      Pertinent Vitals/Pain Pain Assessment: Faces Faces Pain Scale: Hurts little more Pain Location: L hip Pain Descriptors / Indicators: Grimacing;Guarding;Sore Pain Intervention(s): Limited activity within patient's tolerance;Monitored during session;Premedicated before session;Repositioned    Home Living                      Prior Function            PT Goals (current goals can now be found in the care plan section)  Acute Rehab PT Goals Patient Stated Goal: to go home Progress towards PT goals: Progressing toward goals    Frequency    7X/week      PT Plan Current plan remains appropriate    Co-evaluation             End of Session Equipment Utilized During Treatment: Gait belt Activity Tolerance: Patient tolerated treatment well Patient left: in bed;with call bell/phone within reach;with family/visitor present      Time: [id number] PT Time Calculation (min) (ACUTE ONLY): 29 min  Charges:  $Gait Training: 8-22 mins $Therapeutic Activity: 8-22 mins                    G Codes:      Salina April, PTA Pager: [phone]   01/19/2016, 12:00 PM

## 2016-01-19 NOTE — Progress Notes (Signed)
Occupational Therapy Treatment Patient Details Name: Summer [name] MRN: [id number] DOB: 04/26/1955 Today's Date: 01/19/2016    History of present illness Pt is a 60 y.o. female s/p L total hip arthroplasty with posterior total hip precautions. Pt has a past medical history significant for anxiety, blidness of L eye, depression, GERD, hyperlipidemia, hypertension, lumbar vertebral fracture, obesity, retinal detachment, and tachycardia.   OT comments  Pt progressing well toward OT goals. Remains limited by pain and not feeling well due to fever but able to participate in standing ADL with min assist and toilet transfer with min assist this session. Pt able to recall 3/3 posterior hip precautions and maintain these during ADL tasks assessed. Pt would benefit from continued OT services while admitted to improve independence with ADL prior to D/C home with home health OT services. OT will continue to follow acutely with a focus on safe tub transfer.   Follow Up Recommendations  Home health OT;Supervision/Assistance - 24 hour    Equipment Recommendations  3 in 1 bedside comode    Recommendations for Other Services      Precautions / Restrictions Precautions Precautions: Posterior Hip Precaution Comments: pt able to recall 3/3 posterior hip precautions   Restrictions Weight Bearing Restrictions: Yes LLE Weight Bearing: Weight bearing as tolerated Other Position/Activity Restrictions: posterior hip precautions       Mobility Bed Mobility Overal bed mobility: Needs Assistance Bed Mobility: Supine to Sit;Sit to Supine     Supine to sit: HOB elevated;Min assist Sit to supine: Mod assist;HOB elevated   General bed mobility comments: assist to bring L LE to EOB and scoot hips to EOB with use of bed pad; increased time and effort and cues for sequencing/technique; HOB flat and no use of rails; assist to bring L LE into bed and lower trunk slowly while maintaining hip  precautions  Transfers Overall transfer level: Needs assistance Equipment used: Rolling walker (2 wheeled) Transfers: Sit to/from Stand Sit to Stand: Min assist         General transfer comment: assist to power up into standing from EOB and recliner; cues for hand placement    Balance Overall balance assessment: Needs assistance Sitting-balance support: Single extremity supported;Feet supported Sitting balance-Leahy Scale: Fair     Standing balance support: Bilateral upper extremity supported;Single extremity supported;During functional activity Standing balance-Leahy Scale: Poor Standing balance comment: Able to statically stand at sink for oral care with min assist and single UE support. Requires B UE support for dynamic standing                   ADL Overall ADL's : Needs assistance/impaired     Grooming: Minimal assistance;Standing;Oral care                   Toilet Transfer: Minimal assistance;Ambulation;BSC;RW Toilet Transfer Details (indicate cue type and reason): Able to ambulate to bathroom with min assist. Toileting- Clothing Manipulation and Hygiene: Minimal assistance       Functional mobility during ADLs: Minimal assistance;Rolling walker General ADL Comments: Educated pt on safe toilet transfer and grooming tasks at sink while adhering to posterior hip precautions.      Vision                     Perception     Praxis      Cognition   Behavior During Therapy: [facility] for tasks assessed/performed Overall Cognitive Status: Within Functional Limits for tasks assessed  Extremity/Trunk Assessment               Exercises     Shoulder Instructions       General Comments      Pertinent Vitals/ Pain       Pain Assessment: Faces Faces Pain Scale: Hurts even more Pain Location: L hip Pain Descriptors / Indicators: Grimacing;Guarding;Sore Pain Intervention(s): Limited activity within patient's  tolerance  Home Living                                          Prior Functioning/Environment              Frequency  Min 2X/week        Progress Toward Goals  OT Goals(current goals can now be found in the care plan section)  Progress towards OT goals: Progressing toward goals  Acute Rehab OT Goals Patient Stated Goal: to go home OT Goal Formulation: With patient Time For Goal Achievement: 02/01/16 Potential to Achieve Goals: Good ADL Goals Pt Will Perform Lower Body Bathing: with adaptive equipment;sit to/from stand;with supervision Pt Will Perform Lower Body Dressing: sit to/from stand;with supervision Pt Will Transfer to Toilet: with supervision;ambulating Pt Will Perform Toileting - Clothing Manipulation and hygiene: sit to/from stand;with supervision Pt Will Perform Tub/Shower Transfer: Tub transfer;3 in 1;rolling walker;ambulating;with min guard assist  Plan Discharge plan remains appropriate    Co-evaluation                 End of Session Equipment Utilized During Treatment: Gait belt;Rolling walker   Activity Tolerance Patient tolerated treatment well   Patient Left in bed;with call bell/phone within reach   Nurse Communication Mobility status        Time: [id number] OT Time Calculation (min): 19 min  Charges: OT General Charges $OT Visit: 1 Procedure OT Treatments $Self Care/Home Management : 8-22 mins  Norman Herrlich, OTR/L [phone] 01/19/2016, 3:43 PM

## 2016-01-19 NOTE — Consult Note (Signed)
[facility] CM Primary Care Navigator  01/19/2016  Summer [name] 28-Aug-1955 [id number]   Met with patient at the bedside to identify possible discharge needs. Patient reports having increased, worsening left hip pain with limited mobility that led to this admission/ surgery.   Patient endorses Dr.Cynthia White with [facility] as the primary care provider.    Patient states using [facility] to obtain medications without difficulty.   She manages her own medications at home using "pill box" system.   Patient's neighbor [name]) or cousin Wells Guiles) will be providing transportation to her doctors' appointments after discharge.  Patient reports that son lives with her but neighbor Insurance claims handler) and niece Lenna Sciara) will be her primary caregivers at home.  Discharge plan is home with home health services set-up with [facility].  Patient expressed understanding to call primary care provider's office when she returns home, for post discharge follow-up appointment within 7- 14 days or earlier if needs arise. Patient letter provided as a reminder.  Patient denies any other needs or concerns at this time.   For additional questions please contact:  Edwena Felty A. Winford Hehn, BSN, RN-BC [facility] PRIMARY CARE Navigator Cell: [phone]

## 2016-01-20 LAB — CBC
HCT: 27.9 % — ABNORMAL LOW (ref 36.0–46.0)
HEMOGLOBIN: 9.3 g/dL — AB (ref 12.0–15.0)
MCH: 28.8 pg (ref 26.0–34.0)
MCHC: 33.3 g/dL (ref 30.0–36.0)
MCV: 86.4 fL (ref 78.0–100.0)
Platelets: 165 10*3/uL (ref 150–400)
RBC: 3.23 MIL/uL — AB (ref 3.87–5.11)
RDW: 14.8 % (ref 11.5–15.5)
WBC: 9.3 10*3/uL (ref 4.0–10.5)

## 2016-01-20 MED ORDER — POLYETHYLENE GLYCOL 3350 17 G PO PACK
17.0000 g | PACK | Freq: Two times a day (BID) | ORAL | Status: DC
  Administered 2016-01-20: 17 g via ORAL

## 2016-01-20 MED ORDER — BISACODYL 10 MG RE SUPP
10.0000 mg | Freq: Every day | RECTAL | Status: DC
  Administered 2016-01-20: 10 mg via RECTAL

## 2016-01-20 NOTE — Discharge Summary (Signed)
Discharge Summary  Patient ID: Summer [name] MRN: [id number] DOB/AGE: 58-Jun-1957 58 y.o.  Admit date: 01/17/2016 Discharge date: 01/20/2016  Admission Diagnoses:  Avascular necrosis of bone of left hip [facility])  Discharge Diagnoses:  Principal Problem:   Avascular necrosis of bone of left hip (HCC) Active Problems:   Obesity (BMI 30-39.9)   S/P total hip arthroplasty   Postoperative anemia due to acute blood loss   Past Medical History:  Diagnosis Date  . Anxiety   . Atypical facial pain 09/08/2012  . Avascular necrosis of bone of left hip ([place]) 01/17/2016  . Blindness of left eye with normal vision in contralateral eye   . Depression   . Difficult intubation    [facility]  . GERD (gastroesophageal reflux disease)    At times, does not take anything  . Headache(784.0)   . Hyperlipemia   . Hypertension   . Lumbar vertebral fracture (HCC)   . Obesity   . Retinal detachment    Status post scleral buckle  . Tachycardia, unspecified     Surgeries: Procedure(s): TOTAL HIP ARTHROPLASTY on 01/17/2016   Consultants (if any):   Discharged Condition: Improved  Progress   Subjective: Feeling better.  Nausea improved.  OOB with PT.  Pain controlled with PO meds.  Tolerating some liquids - little solids so far.  Urinating.  No CP, SOB.  Objective: General: NAD.  Supine in bed. Resp: No increased WOB Cardio: regular rate and rhythm ABD protuberant, soft, +BS Neurologically intact MSK Neurovascularly intact Sensation intact distally Feet warm Dorsiflexion/Plantar flexion intact Incision: dressing C/D/I  Plan: Mobilize with PT, work on stairs.   Stressed importance of ambulating also to maintain bowel fxn.  Miralax and suppository today. Posterior hip precautions Incentive spirometry  Weight Bearing: Weight Bearing as Tolerated (WBAT) left leg. Dressings: Mepilex.  VTE prophylaxis: Xarelto, ambulation, SCDs Dispo: Home today  when cleared by PT.  Hospital Course: Summer [name] is an 60 y.o. female who was admitted 01/17/2016 with a diagnosis of Avascular necrosis of bone of left hip ([place]) and went to the operating room on 01/17/2016 and underwent the above named procedures.    She was given perioperative antibiotics:  Anti-infectives    Start     Dose/Rate Route Frequency Ordered Stop   01/17/16 1800  ceFAZolin (ANCEF) IVPB 2g/100 mL premix     2 g 200 mL/hr over 30 Minutes Intravenous Every 6 hours 01/17/16 1727 01/18/16 0559   01/17/16 0804  ceFAZolin (ANCEF) 2-4 GM/100ML-% IVPB    Comments:  Block, Sarah   : cabinet override      01/17/16 0804 01/17/16 1010   01/17/16 0803  ceFAZolin (ANCEF) IVPB 2g/100 mL premix     2 g 200 mL/hr over 30 Minutes Intravenous On call to O.R. 01/17/16 0803 01/17/16 1010    .  She was given sequential compression devices, early ambulation, and Xarelto for DVT prophylaxis.  She benefited maximally from the hospital stay and there were no complications.    Recent vital signs:  Vitals:   01/19/16 1900 01/20/16 0449  BP: (!) 113/41 (!) 121/54  Pulse: 84 87  Resp: 17 17  Temp: 99.1 F (37.3 C) 98.7 F (37.1 C)    Recent laboratory studies:  Lab Results  Component Value Date   HGB 9.3 (L) 01/20/2016   HGB 9.7 (L) 01/19/2016   HGB 7.7 (L) 01/18/2016   Lab Results  Component Value Date   WBC  9.3 01/20/2016   PLT 165 01/20/2016   No results found for: INR Lab Results  Component Value Date   NA 133 (L) 01/19/2016   K 4.2 01/19/2016   CL 103 01/19/2016   CO2 21 (L) 01/19/2016   BUN 13 01/19/2016   CREATININE 1.06 (H) 01/19/2016   GLUCOSE 127 (H) 01/19/2016    Discharge Medications:     Medication List    STOP taking these medications   acetaminophen 650 MG CR tablet Commonly known as:  TYLENOL   aspirin EC 81 MG tablet     TAKE these medications   atorvastatin 20 MG tablet Commonly known as:  LIPITOR Take 20 mg by mouth every evening.    calcium gluconate 500 MG tablet Take 1 tablet by mouth daily.   carbamazepine 200 MG tablet Commonly known as:  TEGRETOL Take 2 tablets (400 mg total) by mouth 2 (two) times daily.   cyclobenzaprine 10 MG tablet Commonly known as:  FLEXERIL Take 1 tablet (10 mg total) by mouth 3 (three) times daily as needed for muscle spasms.   DULoxetine 60 MG capsule Commonly known as:  CYMBALTA Take 60 mg by mouth daily.   lisinopril 10 MG tablet Commonly known as:  PRINIVIL,ZESTRIL Take 10 mg by mouth every evening.   LORazepam 0.5 MG tablet Commonly known as:  ATIVAN Take 0.5 mg by mouth 3 (three) times daily as needed for anxiety.   metoprolol succinate 50 MG 24 hr tablet Commonly known as:  TOPROL-XL Take 50 mg by mouth daily.   ondansetron 4 MG tablet Commonly known as:  ZOFRAN Take 1 tablet (4 mg total) by mouth every 8 (eight) hours as needed for nausea or vomiting.   oxyCODONE-acetaminophen 10-325 MG tablet Commonly known as:  PERCOCET Take 1-2 tablets by mouth every 6 (six) hours as needed for pain. MAXIMUM TOTAL ACETAMINOPHEN DOSE IS 4000 MG PER DAY   PRESCRIPTION MEDICATION Inject 1 Dose as directed every 6 (six) months. Potassium injection   rivaroxaban 10 MG Tabs tablet Commonly known as:  XARELTO Take 1 tablet (10 mg total) by mouth daily.   sennosides-docusate sodium 8.6-50 MG tablet Commonly known as:  SENOKOT-S Take 2 tablets by mouth daily.   Vitamin D3 1000 units Caps Take 1,000 Units by mouth daily.            Durable Medical Equipment        Start     Ordered   01/17/16 1728  DME Walker rolling  Once    Question:  Patient needs a walker to treat with the following condition  Answer:  Status post total hip replacement, left   01/17/16 1727   01/17/16 1728  DME 3 n 1  Once     01/17/16 1727   01/17/16 1728  DME Bedside commode  Once    Question:  Patient needs a bedside commode to treat with the following condition  Answer:  Status post total  hip replacement, left   01/17/16 1727      Diagnostic Studies: Dg Hip Port Unilat With Pelvis 1v Left  Result Date: 01/17/2016 CLINICAL DATA:  Arthritis. EXAM: DG HIP (WITH OR WITHOUT PELVIS) 1V PORT LEFT COMPARISON:  No recent prior. FINDINGS: Total left hip replacement. Hardware intact. Good anatomic alignment. No acute bony or joint abnormality. No evidence of fracture or dislocation. IMPRESSION: Total left hip replacement. Hardware intact. Good anatomic alignment. No acute abnormality . Electronically Signed   By: Marcello Moores  Register  On: 01/17/2016 13:29    Disposition: 01-Home or Self Care   Follow-up Information    LANDAU,JOSHUA P, MD. Schedule an appointment as soon as possible for a visit in 2 week(s).   Specialty:  Orthopedic Surgery Contact information: [place] [address] [phone]        [facility] Follow up.   Specialty:  [place]ley Why:  Someone from [facility] will contact you to arrange start date and time for therapy. Contact information: [address] [place] [address] [phone]            Signed: Prudencio Burly III PA-C 01/20/2016, 7:42 AM

## 2016-01-20 NOTE — Progress Notes (Signed)
Physical Therapy Treatment Patient Details Name: Summer [name] MRN: [id number] DOB: May 20, 1955 Today's Date: 01/20/2016    History of Present Illness Pt is a 60 y.o. female s/p L total hip arthroplasty with posterior total hip precautions. Pt has a past medical history significant for anxiety, blidness of L eye, depression, GERD, hyperluipidemia, hypertension, lumbar vertebral fracture, obesity, retinal detachment, and tachycardia.    PT Comments    Patient is progressing toward mobility goals. Tolerated increased gait distance and stair training this session. HEP next session.   Follow Up Recommendations  Home health PT;Supervision/Assistance - 24 hour     Equipment Recommendations  Rolling walker with 5" wheels    Recommendations for Other Services       Precautions / Restrictions Precautions Precautions: Posterior Hip Precaution Comments: pt able to recall 3/3 posterior hip precautions   Restrictions Weight Bearing Restrictions: Yes LLE Weight Bearing: Weight bearing as tolerated Other Position/Activity Restrictions: posterior hip precautions    Mobility  Bed Mobility               General bed mobility comments: pt OOB in chair upon arrival  Transfers Overall transfer level: Needs assistance Equipment used: Rolling walker (2 wheeled) Transfers: Sit to/from Stand Sit to Stand: Min guard         General transfer comment: X2 from recliner with min guard for safety; good technique and safe hand placement  Ambulation/Gait Ambulation/Gait assistance: Min guard Ambulation Distance (Feet): 75 Feet Assistive device: Rolling walker (2 wheeled) Gait Pattern/deviations: Step-to pattern;Step-through pattern;Decreased stance time - left;Decreased step length - right;Decreased weight shift to left;Antalgic Gait velocity: decreased   General Gait Details: cues for step length symmetry, L heel strike, and posture; pt with improved step through pattern with increased  distance   Stairs Stairs: Yes Stairs assistance: Min guard Stair Management: Two rails;Forwards;Step to pattern Number of Stairs: 4 General stair comments: cues for sequencing and technique; min guard for safety  Wheelchair Mobility    Modified Rankin (Stroke Patients Only)       Balance     Sitting balance-Leahy Scale: Good       Standing balance-Leahy Scale: Fair                      Cognition Arousal/Alertness: Awake/alert Behavior During Therapy: WFL for tasks assessed/performed Overall Cognitive Status: Within Functional Limits for tasks assessed                      Exercises      General Comments General comments (skin integrity, edema, etc.): no c/o dizziness this session      Pertinent Vitals/Pain Pain Assessment: Faces Faces Pain Scale: Hurts little more Pain Location: L hip Pain Intervention(s): Limited activity within patient's tolerance;Monitored during session;Premedicated before session;Repositioned    Home Living                      Prior Function            PT Goals (current goals can now be found in the care plan section) Acute Rehab PT Goals Patient Stated Goal: to go home Progress towards PT goals: Progressing toward goals    Frequency    7X/week      PT Plan Current plan remains appropriate    Co-evaluation             End of Session Equipment Utilized During Treatment: Gait belt Activity Tolerance: Patient tolerated treatment  well Patient left: with call bell/phone within reach;in chair     Time: [id number] PT Time Calculation (min) (ACUTE ONLY): 30 min  Charges:  $Gait Training: 23-37 mins                    G Codes:      Salina April, PTA Pager: [phone]   01/20/2016, 11:23 AM

## 2016-01-20 NOTE — Care Management Important Message (Signed)
Important Message  Patient Details  Name: Summer [name] MRN: [id number] Date of Birth: [birth year]   Medicare Important Message Given:  Yes    Nathen May 01/20/2016, 1:09 PM

## 2016-01-20 NOTE — Progress Notes (Signed)
Pt discharge education and instructions completed with pt. Pt voices understanding and denies any questions. Pt IV removed; pt handed her prescriptions for zofran, percocet, xarelto, flexeril and senna. Pt hip dsg remains clean, dry and intact with no active bleeding or drainage noted. Pt discharge home with family to transport her home. Pt awaiting on family to pick her up to transport home. Will continue to monitor pt closely till pick up. P. Angelica Pou RN

## 2016-01-20 NOTE — Progress Notes (Addendum)
Occupational Therapy Treatment/Discharge Patient Details Name: Summer [name] MRN: [id number] DOB: 04-05-1955 Today's Date: 01/20/2016    History of present illness Pt is a 60 y.o. female s/p L total hip arthroplasty with posterior total hip precautions. Pt has a past medical history significant for anxiety, blidness of L eye, depression, GERD, hyperluipidemia, hypertension, lumbar vertebral fracture, obesity, retinal detachment, and tachycardia.   OT comments  Pt progressing well toward OT goals. Pt educated on safe tub transfer while adhering to posterior hip precautions and able to complete with min guard assist using 3-in-1 and RW. Pt also educated on use of AE for LB ADL and able to complete LB dressing tasks with min assist. Pt plans to D/C home with 24 hour assistance this afternoon. Continue to recommend home health OT for OT follow-up post-acute D/C. Pt reports no further questions or concerns. OT will sign off.    Follow Up Recommendations  Home health OT;Supervision/Assistance - 24 hour    Equipment Recommendations  3 in 1 bedside comode    Recommendations for Other Services      Precautions / Restrictions Precautions Precautions: Posterior Hip Precaution Comments: pt able to recall 3/3 posterior hip precautions   Restrictions Weight Bearing Restrictions: Yes LLE Weight Bearing: Weight bearing as tolerated Other Position/Activity Restrictions: posterior hip precautions       Mobility Bed Mobility               General bed mobility comments: pt OOB in chair upon arrival  Transfers Overall transfer level: Needs assistance Equipment used: Rolling walker (2 wheeled) Transfers: Sit to/from Stand Sit to Stand: Min guard         General transfer comment: X2 from recliner with min guard for safety; good technique and safe hand placement    Balance Overall balance assessment: Needs assistance Sitting-balance support: No upper extremity supported;Feet  supported Sitting balance-Leahy Scale: Good     Standing balance support: Bilateral upper extremity supported;Single extremity supported;During functional activity Standing balance-Leahy Scale: Fair                     ADL Overall ADL's : Needs assistance/impaired                     Lower Body Dressing: Minimal assistance;With adaptive equipment;Sit to/from stand   Toilet Transfer: Min guard;Ambulation;RW       Tub/ Shower Transfer: Tub transfer;Minimal assistance;Ambulation;3 in 1;Adhering to hip precautions   Functional mobility during ADLs: Min guard;Rolling walker        Vision                     Perception     Praxis      Cognition   Behavior During Therapy: WFL for tasks assessed/performed Overall Cognitive Status: Within Functional Limits for tasks assessed                       Extremity/Trunk Assessment               Exercises     Shoulder Instructions       General Comments      Pertinent Vitals/ Pain       Pain Assessment: Faces Faces Pain Scale: Hurts even more Pain Location: L hip Pain Descriptors / Indicators: Aching;Sore Pain Intervention(s): Limited activity within patient's tolerance;Monitored during session;Repositioned  Home Living  Prior Functioning/Environment              Frequency  Min 2X/week        Progress Toward Goals  OT Goals(current goals can now be found in the care plan section)  Progress towards OT goals: Progressing toward goals  Acute Rehab OT Goals Patient Stated Goal: to go home OT Goal Formulation: With patient Time For Goal Achievement: 02/01/16 Potential to Achieve Goals: Good ADL Goals Pt Will Perform Lower Body Bathing: with adaptive equipment;sit to/from stand;with supervision Pt Will Perform Lower Body Dressing: sit to/from stand;with supervision Pt Will Transfer to Toilet: with  supervision;ambulating Pt Will Perform Toileting - Clothing Manipulation and hygiene: sit to/from stand;with supervision Pt Will Perform Tub/Shower Transfer: Tub transfer;3 in 1;rolling walker;ambulating;with min guard assist  Plan Discharge plan remains appropriate    Co-evaluation                 End of Session Equipment Utilized During Treatment: Gait belt;Rolling walker   Activity Tolerance Patient tolerated treatment well   Patient Left in bed;with call bell/phone within reach   Nurse Communication Other (comment) (OT signing off)        [id number] OT Time Calculation (min): 34 min  Charges: OT General Charges $OT Visit: 1 Procedure OT Treatments $Self Care/Home Management : 23-37 mins  Norman Herrlich, OTR/L [phone] 01/20/2016, 2:36 PM

## 2016-01-20 NOTE — Progress Notes (Signed)
Physical Therapy Treatment Patient Details Name: Summer [name] MRN: [id number] DOB: [birth year] Today's Date: 01/20/2016    History of Present Illness Pt is a 60 y.o. female s/p L total hip arthroplasty with posterior total hip precautions. Pt has a past medical history significant for anxiety, blidness of L eye, depression, GERD, hyperluipidemia, hypertension, lumbar vertebral fracture, obesity, retinal detachment, and tachycardia.    PT Comments    Current plan remains appropriate.   Follow Up Recommendations  Home health PT;Supervision/Assistance - 24 hour     Equipment Recommendations  Rolling walker with 5" wheels    Recommendations for Other Services       Precautions / Restrictions Precautions Precautions: Posterior Hip Precaution Comments: pt able to recall 3/3 posterior hip precautions   Restrictions Weight Bearing Restrictions: Yes LLE Weight Bearing: Weight bearing as tolerated Other Position/Activity Restrictions: posterior hip precautions    Mobility  Bed Mobility               General bed mobility comments: pt OOB in chair upon arrival  Transfers Overall transfer level: Needs assistance Equipment used: Rolling walker (2 wheeled) Transfers: Sit to/from Stand Sit to Stand: Min guard         General transfer comment: X2 from recliner with min guard for safety; good technique and safe hand placement  Ambulation/Gait Ambulation/Gait assistance: Min guard Ambulation Distance (Feet): 50 Feet Assistive device: Rolling walker (2 wheeled) Gait Pattern/deviations: Step-through pattern;Decreased stance time - left;Decreased step length - right;Decreased weight shift to left;Antalgic Gait velocity: decreased   General Gait Details: cues for posture and step length   Stairs Stairs: Yes Stairs assistance: Min guard Stair Management: One rail Left;Sideways;Step to pattern Number of Stairs: 6 General stair comments: cues for sequencing and technique;  min guard for safety  Wheelchair Mobility    Modified Rankin (Stroke Patients Only)       Balance Overall balance assessment: Needs assistance Sitting-balance support: No upper extremity supported;Feet supported Sitting balance-Leahy Scale: Good     Standing balance support: Bilateral upper extremity supported;Single extremity supported;During functional activity Standing balance-Leahy Scale: Fair                      Cognition Arousal/Alertness: Awake/alert Behavior During Therapy: WFL for tasks assessed/performed Overall Cognitive Status: Within Functional Limits for tasks assessed                      Exercises      General Comments General comments (skin integrity, edema, etc.): no c/o dizziness this session      Pertinent Vitals/Pain Pain Assessment: Faces Faces Pain Scale: Hurts little more Pain Location: L hip Pain Descriptors / Indicators: Aching;Sore Pain Intervention(s): Limited activity within patient's tolerance    Home Living                      Prior Function            PT Goals (current goals can now be found in the care plan section) Acute Rehab PT Goals Patient Stated Goal: to go home Progress towards PT goals: Progressing toward goals    Frequency    7X/week      PT Plan Current plan remains appropriate    Co-evaluation             End of Session Equipment Utilized During Treatment: Gait belt Activity Tolerance: Patient tolerated treatment well Patient left: with call bell/phone within reach;in chair  Time: [id number] PT Time Calculation (min) (ACUTE ONLY): 27 min  Charges:  $Gait Training: 8-22 mins $Therapeutic Activity: 8-22 mins                    G Codes:      [place] April, PTA Pager: [phone]   01/20/2016, 2:52 PM

## 2016-01-21 DIAGNOSIS — F419 Anxiety disorder, unspecified: Secondary | ICD-10-CM | POA: Diagnosis not present

## 2016-01-21 DIAGNOSIS — K5900 Constipation, unspecified: Secondary | ICD-10-CM | POA: Diagnosis not present

## 2016-01-21 DIAGNOSIS — F329 Major depressive disorder, single episode, unspecified: Secondary | ICD-10-CM | POA: Diagnosis not present

## 2016-01-21 DIAGNOSIS — I10 Essential (primary) hypertension: Secondary | ICD-10-CM | POA: Diagnosis not present

## 2016-01-21 DIAGNOSIS — H5462 Unqualified visual loss, left eye, normal vision right eye: Secondary | ICD-10-CM | POA: Diagnosis not present

## 2016-01-21 DIAGNOSIS — Z471 Aftercare following joint replacement surgery: Secondary | ICD-10-CM | POA: Diagnosis not present

## 2016-01-23 DIAGNOSIS — F419 Anxiety disorder, unspecified: Secondary | ICD-10-CM | POA: Diagnosis not present

## 2016-01-23 DIAGNOSIS — F329 Major depressive disorder, single episode, unspecified: Secondary | ICD-10-CM | POA: Diagnosis not present

## 2016-01-23 DIAGNOSIS — I10 Essential (primary) hypertension: Secondary | ICD-10-CM | POA: Diagnosis not present

## 2016-01-23 DIAGNOSIS — Z471 Aftercare following joint replacement surgery: Secondary | ICD-10-CM | POA: Diagnosis not present

## 2016-01-23 DIAGNOSIS — K5900 Constipation, unspecified: Secondary | ICD-10-CM | POA: Diagnosis not present

## 2016-01-23 DIAGNOSIS — H5462 Unqualified visual loss, left eye, normal vision right eye: Secondary | ICD-10-CM | POA: Diagnosis not present

## 2016-01-24 DIAGNOSIS — Z471 Aftercare following joint replacement surgery: Secondary | ICD-10-CM | POA: Diagnosis not present

## 2016-01-24 DIAGNOSIS — H5462 Unqualified visual loss, left eye, normal vision right eye: Secondary | ICD-10-CM | POA: Diagnosis not present

## 2016-01-24 DIAGNOSIS — I10 Essential (primary) hypertension: Secondary | ICD-10-CM | POA: Diagnosis not present

## 2016-01-24 DIAGNOSIS — F419 Anxiety disorder, unspecified: Secondary | ICD-10-CM | POA: Diagnosis not present

## 2016-01-24 DIAGNOSIS — K5900 Constipation, unspecified: Secondary | ICD-10-CM | POA: Diagnosis not present

## 2016-01-24 DIAGNOSIS — F329 Major depressive disorder, single episode, unspecified: Secondary | ICD-10-CM | POA: Diagnosis not present

## 2016-01-25 DIAGNOSIS — I10 Essential (primary) hypertension: Secondary | ICD-10-CM | POA: Diagnosis not present

## 2016-01-25 DIAGNOSIS — Z471 Aftercare following joint replacement surgery: Secondary | ICD-10-CM | POA: Diagnosis not present

## 2016-01-25 DIAGNOSIS — F329 Major depressive disorder, single episode, unspecified: Secondary | ICD-10-CM | POA: Diagnosis not present

## 2016-01-25 DIAGNOSIS — H5462 Unqualified visual loss, left eye, normal vision right eye: Secondary | ICD-10-CM | POA: Diagnosis not present

## 2016-01-25 DIAGNOSIS — F419 Anxiety disorder, unspecified: Secondary | ICD-10-CM | POA: Diagnosis not present

## 2016-01-25 DIAGNOSIS — K5900 Constipation, unspecified: Secondary | ICD-10-CM | POA: Diagnosis not present

## 2016-01-27 DIAGNOSIS — H5462 Unqualified visual loss, left eye, normal vision right eye: Secondary | ICD-10-CM | POA: Diagnosis not present

## 2016-01-27 DIAGNOSIS — F329 Major depressive disorder, single episode, unspecified: Secondary | ICD-10-CM | POA: Diagnosis not present

## 2016-01-27 DIAGNOSIS — I10 Essential (primary) hypertension: Secondary | ICD-10-CM | POA: Diagnosis not present

## 2016-01-27 DIAGNOSIS — K5900 Constipation, unspecified: Secondary | ICD-10-CM | POA: Diagnosis not present

## 2016-01-27 DIAGNOSIS — Z471 Aftercare following joint replacement surgery: Secondary | ICD-10-CM | POA: Diagnosis not present

## 2016-01-27 DIAGNOSIS — F419 Anxiety disorder, unspecified: Secondary | ICD-10-CM | POA: Diagnosis not present

## 2016-01-30 DIAGNOSIS — I959 Hypotension, unspecified: Secondary | ICD-10-CM | POA: Diagnosis not present

## 2016-01-30 DIAGNOSIS — F329 Major depressive disorder, single episode, unspecified: Secondary | ICD-10-CM | POA: Diagnosis not present

## 2016-01-30 DIAGNOSIS — R350 Frequency of micturition: Secondary | ICD-10-CM | POA: Diagnosis not present

## 2016-01-30 DIAGNOSIS — Z96642 Presence of left artificial hip joint: Secondary | ICD-10-CM | POA: Diagnosis not present

## 2016-01-30 DIAGNOSIS — F419 Anxiety disorder, unspecified: Secondary | ICD-10-CM | POA: Diagnosis not present

## 2016-01-30 DIAGNOSIS — I10 Essential (primary) hypertension: Secondary | ICD-10-CM | POA: Diagnosis not present

## 2016-01-30 DIAGNOSIS — Z471 Aftercare following joint replacement surgery: Secondary | ICD-10-CM | POA: Diagnosis not present

## 2016-01-30 DIAGNOSIS — M1612 Unilateral primary osteoarthritis, left hip: Secondary | ICD-10-CM | POA: Diagnosis not present

## 2016-01-30 DIAGNOSIS — K5900 Constipation, unspecified: Secondary | ICD-10-CM | POA: Diagnosis not present

## 2016-01-30 DIAGNOSIS — D62 Acute posthemorrhagic anemia: Secondary | ICD-10-CM | POA: Diagnosis not present

## 2016-01-30 DIAGNOSIS — H5462 Unqualified visual loss, left eye, normal vision right eye: Secondary | ICD-10-CM | POA: Diagnosis not present

## 2016-02-01 DIAGNOSIS — K5900 Constipation, unspecified: Secondary | ICD-10-CM | POA: Diagnosis not present

## 2016-02-01 DIAGNOSIS — Z471 Aftercare following joint replacement surgery: Secondary | ICD-10-CM | POA: Diagnosis not present

## 2016-02-01 DIAGNOSIS — F329 Major depressive disorder, single episode, unspecified: Secondary | ICD-10-CM | POA: Diagnosis not present

## 2016-02-01 DIAGNOSIS — F419 Anxiety disorder, unspecified: Secondary | ICD-10-CM | POA: Diagnosis not present

## 2016-02-01 DIAGNOSIS — I10 Essential (primary) hypertension: Secondary | ICD-10-CM | POA: Diagnosis not present

## 2016-02-01 DIAGNOSIS — H5462 Unqualified visual loss, left eye, normal vision right eye: Secondary | ICD-10-CM | POA: Diagnosis not present

## 2016-02-02 DIAGNOSIS — Z471 Aftercare following joint replacement surgery: Secondary | ICD-10-CM | POA: Diagnosis not present

## 2016-02-02 DIAGNOSIS — F329 Major depressive disorder, single episode, unspecified: Secondary | ICD-10-CM | POA: Diagnosis not present

## 2016-02-02 DIAGNOSIS — H5462 Unqualified visual loss, left eye, normal vision right eye: Secondary | ICD-10-CM | POA: Diagnosis not present

## 2016-02-02 DIAGNOSIS — I10 Essential (primary) hypertension: Secondary | ICD-10-CM | POA: Diagnosis not present

## 2016-02-02 DIAGNOSIS — F419 Anxiety disorder, unspecified: Secondary | ICD-10-CM | POA: Diagnosis not present

## 2016-02-02 DIAGNOSIS — K5900 Constipation, unspecified: Secondary | ICD-10-CM | POA: Diagnosis not present

## 2016-02-03 DIAGNOSIS — F419 Anxiety disorder, unspecified: Secondary | ICD-10-CM | POA: Diagnosis not present

## 2016-02-03 DIAGNOSIS — I10 Essential (primary) hypertension: Secondary | ICD-10-CM | POA: Diagnosis not present

## 2016-02-03 DIAGNOSIS — F329 Major depressive disorder, single episode, unspecified: Secondary | ICD-10-CM | POA: Diagnosis not present

## 2016-02-03 DIAGNOSIS — K5900 Constipation, unspecified: Secondary | ICD-10-CM | POA: Diagnosis not present

## 2016-02-03 DIAGNOSIS — H5462 Unqualified visual loss, left eye, normal vision right eye: Secondary | ICD-10-CM | POA: Diagnosis not present

## 2016-02-03 DIAGNOSIS — Z471 Aftercare following joint replacement surgery: Secondary | ICD-10-CM | POA: Diagnosis not present

## 2016-02-06 DIAGNOSIS — I10 Essential (primary) hypertension: Secondary | ICD-10-CM | POA: Diagnosis not present

## 2016-02-06 DIAGNOSIS — Z471 Aftercare following joint replacement surgery: Secondary | ICD-10-CM | POA: Diagnosis not present

## 2016-02-06 DIAGNOSIS — K5900 Constipation, unspecified: Secondary | ICD-10-CM | POA: Diagnosis not present

## 2016-02-06 DIAGNOSIS — F419 Anxiety disorder, unspecified: Secondary | ICD-10-CM | POA: Diagnosis not present

## 2016-02-06 DIAGNOSIS — F329 Major depressive disorder, single episode, unspecified: Secondary | ICD-10-CM | POA: Diagnosis not present

## 2016-02-06 DIAGNOSIS — H5462 Unqualified visual loss, left eye, normal vision right eye: Secondary | ICD-10-CM | POA: Diagnosis not present

## 2016-02-07 ENCOUNTER — Ambulatory Visit: Payer: Commercial Managed Care - HMO | Admitting: Adult Health

## 2016-02-07 DIAGNOSIS — F419 Anxiety disorder, unspecified: Secondary | ICD-10-CM | POA: Diagnosis not present

## 2016-02-07 DIAGNOSIS — F329 Major depressive disorder, single episode, unspecified: Secondary | ICD-10-CM | POA: Diagnosis not present

## 2016-02-07 DIAGNOSIS — Z471 Aftercare following joint replacement surgery: Secondary | ICD-10-CM | POA: Diagnosis not present

## 2016-02-07 DIAGNOSIS — I10 Essential (primary) hypertension: Secondary | ICD-10-CM | POA: Diagnosis not present

## 2016-02-07 DIAGNOSIS — H5462 Unqualified visual loss, left eye, normal vision right eye: Secondary | ICD-10-CM | POA: Diagnosis not present

## 2016-02-07 DIAGNOSIS — K5900 Constipation, unspecified: Secondary | ICD-10-CM | POA: Diagnosis not present

## 2016-02-09 DIAGNOSIS — K5900 Constipation, unspecified: Secondary | ICD-10-CM | POA: Diagnosis not present

## 2016-02-09 DIAGNOSIS — H5462 Unqualified visual loss, left eye, normal vision right eye: Secondary | ICD-10-CM | POA: Diagnosis not present

## 2016-02-09 DIAGNOSIS — Z471 Aftercare following joint replacement surgery: Secondary | ICD-10-CM | POA: Diagnosis not present

## 2016-02-09 DIAGNOSIS — F419 Anxiety disorder, unspecified: Secondary | ICD-10-CM | POA: Diagnosis not present

## 2016-02-09 DIAGNOSIS — I10 Essential (primary) hypertension: Secondary | ICD-10-CM | POA: Diagnosis not present

## 2016-02-09 DIAGNOSIS — F329 Major depressive disorder, single episode, unspecified: Secondary | ICD-10-CM | POA: Diagnosis not present

## 2016-02-16 DIAGNOSIS — K5900 Constipation, unspecified: Secondary | ICD-10-CM | POA: Diagnosis not present

## 2016-02-16 DIAGNOSIS — F419 Anxiety disorder, unspecified: Secondary | ICD-10-CM | POA: Diagnosis not present

## 2016-02-16 DIAGNOSIS — Z471 Aftercare following joint replacement surgery: Secondary | ICD-10-CM | POA: Diagnosis not present

## 2016-02-16 DIAGNOSIS — H5462 Unqualified visual loss, left eye, normal vision right eye: Secondary | ICD-10-CM | POA: Diagnosis not present

## 2016-02-16 DIAGNOSIS — F329 Major depressive disorder, single episode, unspecified: Secondary | ICD-10-CM | POA: Diagnosis not present

## 2016-02-16 DIAGNOSIS — I10 Essential (primary) hypertension: Secondary | ICD-10-CM | POA: Diagnosis not present

## 2016-02-27 DIAGNOSIS — M1612 Unilateral primary osteoarthritis, left hip: Secondary | ICD-10-CM | POA: Diagnosis not present

## 2016-03-06 DIAGNOSIS — M810 Age-related osteoporosis without current pathological fracture: Secondary | ICD-10-CM | POA: Diagnosis not present

## 2016-03-08 ENCOUNTER — Ambulatory Visit: Payer: Commercial Managed Care - HMO | Admitting: Adult Health

## 2016-03-26 DIAGNOSIS — Z96642 Presence of left artificial hip joint: Secondary | ICD-10-CM | POA: Diagnosis not present

## 2016-03-29 ENCOUNTER — Other Ambulatory Visit: Payer: Self-pay | Admitting: Neurology

## 2016-04-03 DIAGNOSIS — E785 Hyperlipidemia, unspecified: Secondary | ICD-10-CM | POA: Diagnosis not present

## 2016-04-03 DIAGNOSIS — E559 Vitamin D deficiency, unspecified: Secondary | ICD-10-CM | POA: Diagnosis not present

## 2016-04-03 DIAGNOSIS — N3281 Overactive bladder: Secondary | ICD-10-CM | POA: Diagnosis not present

## 2016-04-03 DIAGNOSIS — Z23 Encounter for immunization: Secondary | ICD-10-CM | POA: Diagnosis not present

## 2016-04-03 DIAGNOSIS — F324 Major depressive disorder, single episode, in partial remission: Secondary | ICD-10-CM | POA: Diagnosis not present

## 2016-04-03 DIAGNOSIS — E6609 Other obesity due to excess calories: Secondary | ICD-10-CM | POA: Diagnosis not present

## 2016-04-03 DIAGNOSIS — I10 Essential (primary) hypertension: Secondary | ICD-10-CM | POA: Diagnosis not present

## 2016-04-03 DIAGNOSIS — Z0000 Encounter for general adult medical examination without abnormal findings: Secondary | ICD-10-CM | POA: Diagnosis not present

## 2016-04-12 ENCOUNTER — Encounter: Payer: Self-pay | Admitting: Adult Health

## 2016-04-12 ENCOUNTER — Ambulatory Visit (INDEPENDENT_AMBULATORY_CARE_PROVIDER_SITE_OTHER): Payer: Medicare HMO | Admitting: Adult Health

## 2016-04-12 VITALS — BP 128/69 | HR 84 | Ht 65.5 in | Wt 219.0 lb

## 2016-04-12 DIAGNOSIS — Z5181 Encounter for therapeutic drug level monitoring: Secondary | ICD-10-CM | POA: Diagnosis not present

## 2016-04-12 DIAGNOSIS — G501 Atypical facial pain: Secondary | ICD-10-CM | POA: Diagnosis not present

## 2016-04-12 DIAGNOSIS — R269 Unspecified abnormalities of gait and mobility: Secondary | ICD-10-CM

## 2016-04-12 MED ORDER — CARBAMAZEPINE 200 MG PO TABS: 400.0000 mg | ORAL_TABLET | Freq: Two times a day (BID) | ORAL | 4 refills | Status: DC
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
## 2016-04-12 NOTE — Patient Instructions (Signed)
Continue Carbamazepine Blood work today If your symptoms worsen or you develop new symptoms please let us know.   

## 2016-04-12 NOTE — Progress Notes (Signed)
I have read the note, and I agree with the clinical assessment and plan.  WILLIS,CHARLES KEITH   

## 2016-04-13 ENCOUNTER — Telehealth: Payer: Self-pay | Admitting: *Deleted

## 2016-04-13 LAB — CBC WITH DIFFERENTIAL/PLATELET
Basophils Absolute: 0 10*3/uL (ref 0.0–0.2)
Basos: 1 %
EOS (ABSOLUTE): 0.1 10*3/uL (ref 0.0–0.4)
EOS: 3 %
HEMATOCRIT: 36.1 % (ref 34.0–46.6)
HEMOGLOBIN: 11.8 g/dL (ref 11.1–15.9)
IMMATURE GRANS (ABS): 0 10*3/uL (ref 0.0–0.1)
Immature Granulocytes: 0 %
LYMPHS: 29 %
Lymphocytes Absolute: 1.4 10*3/uL (ref 0.7–3.1)
MCH: 28.6 pg (ref 26.6–33.0)
MCHC: 32.7 g/dL (ref 31.5–35.7)
MCV: 87 fL (ref 79–97)
MONOCYTES: 7 %
Monocytes Absolute: 0.3 10*3/uL (ref 0.1–0.9)
NEUTROS ABS: 2.9 10*3/uL (ref 1.4–7.0)
Neutrophils: 60 %
Platelets: 261 10*3/uL (ref 150–379)
RBC: 4.13 x10E6/uL (ref 3.77–5.28)
RDW: 15.1 % (ref 12.3–15.4)
WBC: 4.8 10*3/uL (ref 3.4–10.8)

## 2016-04-13 LAB — COMPREHENSIVE METABOLIC PANEL
ALBUMIN: 3.8 g/dL (ref 3.5–5.5)
ALT: 8 IU/L (ref 0–32)
AST: 16 IU/L (ref 0–40)
Albumin/Globulin Ratio: 1 — ABNORMAL LOW (ref 1.2–2.2)
Alkaline Phosphatase: 124 IU/L — ABNORMAL HIGH (ref 39–117)
BUN/Creatinine Ratio: 13 (ref 9–23)
BUN: 12 mg/dL (ref 6–24)
Bilirubin Total: <0.2 mg/dL (ref 0.0–1.2)
CO2: 23 mmol/L (ref 18–29)
Calcium: 9.1 mg/dL (ref 8.7–10.2)
Chloride: 103 mmol/L (ref 96–106)
Creatinine, Ser: 0.91 mg/dL (ref 0.57–1.00)
GFR calc non Af Amer: 70 (ref >59)
GFR, EST AFRICAN AMERICAN: 80 (ref >59)
GLUCOSE: 99 mg/dL (ref 65–99)
Globulin, Total: 3.7 (ref 1.5–4.5)
Potassium: 4.3 mmol/L (ref 3.5–5.2)
Sodium: 143 mmol/L (ref 134–144)
TOTAL PROTEIN: 7.5 g/dL (ref 6.0–8.5)

## 2016-04-13 LAB — CARBAMAZEPINE LEVEL, TOTAL: CARBAMAZEPINE LVL: 8.6 ug/mL (ref 4.0–12.0)

## 2016-04-13 NOTE — Telephone Encounter (Signed)
-----   Message from Ward Givens, NP sent at 04/13/2016 10:49 AM EST ----- Lab work unremarkable. Please call patient.

## 2016-04-13 NOTE — Telephone Encounter (Signed)
Relayed to pt that her lab results are unremarkable.   She verbalized understanding.

## 2016-04-23 DIAGNOSIS — Z96642 Presence of left artificial hip joint: Secondary | ICD-10-CM | POA: Diagnosis not present

## 2016-05-08 DIAGNOSIS — H15002 Unspecified scleritis, left eye: Secondary | ICD-10-CM | POA: Diagnosis not present

## 2016-05-08 DIAGNOSIS — G501 Atypical facial pain: Secondary | ICD-10-CM | POA: Diagnosis not present

## 2016-05-08 DIAGNOSIS — Z961 Presence of intraocular lens: Secondary | ICD-10-CM | POA: Diagnosis not present

## 2016-05-08 DIAGNOSIS — H3589 Other specified retinal disorders: Secondary | ICD-10-CM | POA: Diagnosis not present

## 2016-05-08 DIAGNOSIS — H3342 Traction detachment of retina, left eye: Secondary | ICD-10-CM | POA: Diagnosis not present

## 2016-06-11 ENCOUNTER — Ambulatory Visit
Admission: RE | Admit: 2016-06-11 | Discharge: 2016-06-11 | Disposition: A | Discharge status: Home / Self Care | Payer: Medicare HMO | Source: Ambulatory Visit | Attending: Family Medicine | Admitting: Family Medicine

## 2016-06-11 ENCOUNTER — Other Ambulatory Visit: Payer: Self-pay | Admitting: Family Medicine

## 2016-06-11 DIAGNOSIS — S8012XA Contusion of left lower leg, initial encounter: Secondary | ICD-10-CM | POA: Diagnosis not present

## 2016-06-11 DIAGNOSIS — S4992XA Unspecified injury of left shoulder and upper arm, initial encounter: Secondary | ICD-10-CM | POA: Diagnosis not present

## 2016-06-11 DIAGNOSIS — M25512 Pain in left shoulder: Secondary | ICD-10-CM

## 2016-06-11 DIAGNOSIS — Z9181 History of falling: Secondary | ICD-10-CM | POA: Diagnosis not present

## 2016-07-04 DIAGNOSIS — I10 Essential (primary) hypertension: Secondary | ICD-10-CM | POA: Diagnosis not present

## 2016-07-04 DIAGNOSIS — F324 Major depressive disorder, single episode, in partial remission: Secondary | ICD-10-CM | POA: Diagnosis not present

## 2016-07-04 DIAGNOSIS — E785 Hyperlipidemia, unspecified: Secondary | ICD-10-CM | POA: Diagnosis not present

## 2016-07-23 DIAGNOSIS — M545 Low back pain: Secondary | ICD-10-CM | POA: Diagnosis not present

## 2016-07-23 DIAGNOSIS — Z96642 Presence of left artificial hip joint: Secondary | ICD-10-CM | POA: Diagnosis not present

## 2016-08-27 ENCOUNTER — Emergency Department (HOSPITAL_COMMUNITY): Admission: EM | Admit: 2016-08-27 | Discharge: 2016-08-27 | Discharge status: Absent without leave | Payer: Medicare HMO

## 2016-08-27 DIAGNOSIS — I951 Orthostatic hypotension: Secondary | ICD-10-CM | POA: Diagnosis not present

## 2016-08-27 DIAGNOSIS — R42 Dizziness and giddiness: Secondary | ICD-10-CM | POA: Diagnosis not present

## 2016-08-27 DIAGNOSIS — G43909 Migraine, unspecified, not intractable, without status migrainosus: Secondary | ICD-10-CM | POA: Diagnosis not present

## 2016-08-27 DIAGNOSIS — R2681 Unsteadiness on feet: Secondary | ICD-10-CM | POA: Diagnosis not present

## 2016-08-28 ENCOUNTER — Emergency Department (HOSPITAL_COMMUNITY): Payer: Medicare HMO

## 2016-08-28 ENCOUNTER — Emergency Department (HOSPITAL_COMMUNITY)
Admission: EM | Admit: 2016-08-28 | Discharge: 2016-08-28 | Disposition: A | Discharge status: Home / Self Care | Payer: Medicare HMO | Attending: Emergency Medicine | Admitting: Emergency Medicine

## 2016-08-28 ENCOUNTER — Encounter (HOSPITAL_COMMUNITY): Payer: Self-pay | Admitting: Emergency Medicine

## 2016-08-28 DIAGNOSIS — Z79899 Other long term (current) drug therapy: Secondary | ICD-10-CM | POA: Diagnosis not present

## 2016-08-28 DIAGNOSIS — R55 Syncope and collapse: Secondary | ICD-10-CM

## 2016-08-28 DIAGNOSIS — R42 Dizziness and giddiness: Secondary | ICD-10-CM | POA: Diagnosis not present

## 2016-08-28 DIAGNOSIS — Z7901 Long term (current) use of anticoagulants: Secondary | ICD-10-CM | POA: Diagnosis not present

## 2016-08-28 DIAGNOSIS — I10 Essential (primary) hypertension: Secondary | ICD-10-CM | POA: Insufficient documentation

## 2016-08-28 DIAGNOSIS — L231 Allergic contact dermatitis due to adhesives: Secondary | ICD-10-CM | POA: Diagnosis not present

## 2016-08-28 LAB — BASIC METABOLIC PANEL
Anion gap: 8 (ref 5–15)
BUN: 13 mg/dL (ref 6–20)
CO2: 26 mmol/L (ref 22–32)
CREATININE: 1.06 mg/dL — AB (ref 0.44–1.00)
Calcium: 9.4 mg/dL (ref 8.9–10.3)
Chloride: 102 mmol/L (ref 101–111)
GFR calc Af Amer: >60 mL/min (ref >60)
GFR, EST NON AFRICAN AMERICAN: 57 mL/min — AB (ref >60)
Glucose, Bld: 95 mg/dL (ref 65–99)
Potassium: 4.6 mmol/L (ref 3.5–5.1)
SODIUM: 136 mmol/L (ref 135–145)

## 2016-08-28 LAB — CBC WITH DIFFERENTIAL/PLATELET
BASOS ABS: 0 10*3/uL (ref 0.0–0.1)
Basophils Relative: 0 %
EOS ABS: 0.2 10*3/uL (ref 0.0–0.7)
EOS PCT: 3 %
HCT: 35.1 % — ABNORMAL LOW (ref 36.0–46.0)
Hemoglobin: 11.1 g/dL — ABNORMAL LOW (ref 12.0–15.0)
Lymphocytes Relative: 32 %
Lymphs Abs: 1.7 10*3/uL (ref 0.7–4.0)
MCH: 29.1 pg (ref 26.0–34.0)
MCHC: 31.6 g/dL (ref 30.0–36.0)
MCV: 91.9 fL (ref 78.0–100.0)
Monocytes Absolute: 0.3 10*3/uL (ref 0.1–1.0)
Monocytes Relative: 6 %
Neutro Abs: 3.2 10*3/uL (ref 1.7–7.7)
Neutrophils Relative %: 59 %
Platelets: 226 10*3/uL (ref 150–400)
RBC: 3.82 MIL/uL — AB (ref 3.87–5.11)
RDW: 13 % (ref 11.5–15.5)
WBC: 5.5 10*3/uL (ref 4.0–10.5)

## 2016-08-28 LAB — TSH: TSH: 5.278 u[IU]/mL — AB (ref 0.350–4.500)

## 2016-08-28 NOTE — ED Triage Notes (Signed)
Pt. Stated, I've had dizziness since yesterday, came over here but the wait was too long. No LOC. No other symptoms.

## 2016-08-28 NOTE — ED Notes (Signed)
ED Provider at bedside. 
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
## 2016-08-28 NOTE — Discharge Instructions (Signed)
Continue your medications as previously prescribed.  Follow-up with your primary Dr. if symptoms are not improving in the next 1-2 weeks, and return to the ER symptoms significantly worsen or change.

## 2016-08-31 DIAGNOSIS — F339 Major depressive disorder, recurrent, unspecified: Secondary | ICD-10-CM | POA: Diagnosis not present

## 2016-08-31 DIAGNOSIS — I10 Essential (primary) hypertension: Secondary | ICD-10-CM | POA: Diagnosis not present

## 2016-08-31 DIAGNOSIS — H8110 Benign paroxysmal vertigo, unspecified ear: Secondary | ICD-10-CM | POA: Diagnosis not present

## 2016-08-31 DIAGNOSIS — R296 Repeated falls: Secondary | ICD-10-CM | POA: Diagnosis not present

## 2016-09-03 DIAGNOSIS — Z96642 Presence of left artificial hip joint: Secondary | ICD-10-CM | POA: Diagnosis not present

## 2016-09-04 DIAGNOSIS — M810 Age-related osteoporosis without current pathological fracture: Secondary | ICD-10-CM | POA: Diagnosis not present

## 2016-09-14 ENCOUNTER — Ambulatory Visit: Payer: Medicare HMO | Attending: Family Medicine

## 2016-09-14 ENCOUNTER — Telehealth: Payer: Self-pay | Admitting: *Deleted

## 2016-09-14 DIAGNOSIS — R42 Dizziness and giddiness: Secondary | ICD-10-CM | POA: Insufficient documentation

## 2016-09-14 DIAGNOSIS — R2689 Other abnormalities of gait and mobility: Secondary | ICD-10-CM | POA: Insufficient documentation

## 2016-09-14 NOTE — Telephone Encounter (Signed)
Called and LVM for pt. Advised I was calling to offer appt today with CW,MD. Advised we will call again later if another appt opens up in the future. Asked her to call back if she is on wait list in error or if she wants to schedule appt.
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
## 2016-09-24 ENCOUNTER — Ambulatory Visit: Payer: Medicare HMO | Admitting: Rehabilitative and Restorative Service Providers"

## 2016-09-26 ENCOUNTER — Encounter: Payer: Medicare HMO | Admitting: Rehabilitative and Restorative Service Providers"

## 2016-10-01 ENCOUNTER — Ambulatory Visit: Payer: Medicare HMO | Admitting: Physical Therapy

## 2016-10-01 DIAGNOSIS — I10 Essential (primary) hypertension: Secondary | ICD-10-CM | POA: Diagnosis not present

## 2016-10-01 DIAGNOSIS — F419 Anxiety disorder, unspecified: Secondary | ICD-10-CM | POA: Diagnosis not present

## 2016-10-01 DIAGNOSIS — F339 Major depressive disorder, recurrent, unspecified: Secondary | ICD-10-CM | POA: Diagnosis not present

## 2016-10-01 DIAGNOSIS — K219 Gastro-esophageal reflux disease without esophagitis: Secondary | ICD-10-CM | POA: Diagnosis not present

## 2016-10-01 DIAGNOSIS — H8110 Benign paroxysmal vertigo, unspecified ear: Secondary | ICD-10-CM | POA: Diagnosis not present

## 2016-10-03 ENCOUNTER — Encounter: Payer: Medicare HMO | Admitting: Physical Therapy

## 2016-10-09 ENCOUNTER — Encounter: Payer: Medicare HMO | Admitting: Rehabilitative and Restorative Service Providers"

## 2016-10-11 ENCOUNTER — Ambulatory Visit (INDEPENDENT_AMBULATORY_CARE_PROVIDER_SITE_OTHER): Payer: Medicare HMO | Admitting: Neurology

## 2016-10-11 ENCOUNTER — Encounter: Payer: Self-pay | Admitting: Neurology

## 2016-10-11 VITALS — BP 130/69 | HR 88 | Ht 65.0 in | Wt 232.0 lb

## 2016-10-11 DIAGNOSIS — Z5181 Encounter for therapeutic drug level monitoring: Secondary | ICD-10-CM | POA: Diagnosis not present

## 2016-10-11 DIAGNOSIS — G501 Atypical facial pain: Secondary | ICD-10-CM | POA: Diagnosis not present

## 2016-10-11 MED ORDER — DULOXETINE HCL 60 MG PO CPEP: 60.0000 mg | ORAL_CAPSULE | Freq: Two times a day (BID) | ORAL | 3 refills | Status: DC
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
## 2016-10-11 NOTE — Patient Instructions (Signed)
   We will go up on the Cymbalta to 60 mg twice a day

## 2016-10-12 ENCOUNTER — Telehealth: Payer: Self-pay | Admitting: Neurology

## 2016-10-12 LAB — CBC WITH DIFFERENTIAL/PLATELET
BASOS: 0 %
Basophils Absolute: 0 10*3/uL (ref 0.0–0.2)
EOS (ABSOLUTE): 0.1 10*3/uL (ref 0.0–0.4)
Eos: 3 %
Hematocrit: 33.8 % — ABNORMAL LOW (ref 34.0–46.6)
Hemoglobin: 11.9 g/dL (ref 11.1–15.9)
Immature Grans (Abs): 0 10*3/uL (ref 0.0–0.1)
Immature Granulocytes: 0 %
LYMPHS ABS: 1.5 10*3/uL (ref 0.7–3.1)
Lymphs: 31 %
MCH: 30.8 pg (ref 26.6–33.0)
MCHC: 35.2 g/dL (ref 31.5–35.7)
MCV: 88 fL (ref 79–97)
MONOS ABS: 0.3 10*3/uL (ref 0.1–0.9)
Monocytes: 7 %
NEUTROS ABS: 2.8 10*3/uL (ref 1.4–7.0)
NEUTROS PCT: 59 %
PLATELETS: 241 10*3/uL (ref 150–379)
RBC: 3.86 x10E6/uL (ref 3.77–5.28)
RDW: 14 % (ref 12.3–15.4)
WBC: 4.8 10*3/uL (ref 3.4–10.8)

## 2016-10-12 LAB — COMPREHENSIVE METABOLIC PANEL
ALT: 9 IU/L (ref 0–32)
AST: 20 IU/L (ref 0–40)
Albumin/Globulin Ratio: 1.1 — ABNORMAL LOW (ref 1.2–2.2)
Albumin: 3.9 g/dL (ref 3.5–5.5)
Alkaline Phosphatase: 138 IU/L — ABNORMAL HIGH (ref 39–117)
BUN/Creatinine Ratio: 10 (ref 9–23)
BUN: 9 mg/dL (ref 6–24)
Bilirubin Total: <0.2 mg/dL (ref 0.0–1.2)
CALCIUM: 9.3 mg/dL (ref 8.7–10.2)
CO2: 25 mmol/L (ref 20–29)
Chloride: 106 mmol/L (ref 96–106)
Creatinine, Ser: 0.87 mg/dL (ref 0.57–1.00)
GFR calc Af Amer: 85 mL/min/{1.73_m2} (ref >59)
GFR, EST NON AFRICAN AMERICAN: 74 mL/min/{1.73_m2} (ref >59)
GLOBULIN, TOTAL: 3.6 g/dL (ref 1.5–4.5)
Glucose: 104 mg/dL — ABNORMAL HIGH (ref 65–99)
POTASSIUM: 4.5 mmol/L (ref 3.5–5.2)
SODIUM: 144 mmol/L (ref 134–144)
Total Protein: 7.5 g/dL (ref 6.0–8.5)

## 2016-10-12 LAB — CARBAMAZEPINE LEVEL, TOTAL: Carbamazepine (Tegretol), S: 11.4 ug/mL (ref 4.0–12.0)

## 2016-10-12 NOTE — Telephone Encounter (Signed)
I called patient. The blood work showed a mild elevation alkaline phosphatase takes level likely related to carbamazepine, the hematocrit is minimally low. The carbamazepine level is therapeutic, but in the upper range of the therapeutic range. This could be resulting in some gait instability, the patient will cut back to 1.5 tablets of carbamazepine in the morning, 2 in the evening.  I discussed this with her.

## 2016-10-15 ENCOUNTER — Encounter: Payer: Medicare HMO | Admitting: Physical Therapy

## 2016-10-17 ENCOUNTER — Telehealth: Payer: Self-pay | Admitting: Neurology

## 2016-10-17 NOTE — Telephone Encounter (Signed)
Called patient back. Advised per phone note from Dr Jannifer Franklin on 10/12/16, she should be taking rx carbamazepine 1.5 tablets of in the morning, 2 in the evening.  For rx duloxetine, she should be taking 60mg  twice daily.   She verbalized understanding and has no further questions at this time.

## 2016-10-17 NOTE — Telephone Encounter (Signed)
Pt is asking for a call back re: DULoxetine (CYMBALTA) 60 MG capsule carbamazepine (EPITOL) 200 MG tablet  and how to take them, she is asking for a call back

## 2016-10-18 DIAGNOSIS — R2689 Other abnormalities of gait and mobility: Secondary | ICD-10-CM | POA: Insufficient documentation

## 2016-10-18 DIAGNOSIS — R42 Dizziness and giddiness: Secondary | ICD-10-CM | POA: Insufficient documentation

## 2016-10-18 NOTE — Therapy (Signed)
[facility] [address] [place], [place], [address] Phone: [phone]   Fax:  [phone]  Patient Details  Name: Summer [name] MRN: [id number] Date of Birth: January 09, 1956 Referring Provider:  No ref. provider found  Encounter Date: 2016/10/24  PHYSICAL THERAPY DISCHARGE SUMMARY  Visits from Start of Care: 1  Current functional level related to goals / functional outcomes:     PT Long Term Goals - 09/14/16 1336      PT LONG TERM GOAL #1   Title Pt will be IND in HEP to improve balance, dizziness and safety. TARGET DATE FOR ALL LTGS: 10/12/16   Status New     PT LONG TERM GOAL #2   Title Perform SOT and FGA and write goals as indicated.    Status New     PT LONG TERM GOAL #3   Title Pt will report </=4/10 dizziness at worst in order to improve safety during functional mobility.    Status New     PT LONG TERM GOAL #4   Title Pt will improve gait speed to >/=2.77f/sec. with LRAD to safely amb. in the community.    Status New        Remaining deficits: Unknown, as pt cancelled remaining visits reporting she can't afford co-pay. She was seen for eval only.   Education / Equipment: PT POC, frequency, duration  Plan: Patient agrees to discharge.  Patient goals were not met. Patient is being discharged due to not returning since the last visit.  ?????             G-Codes - 0Sep 05, 20180910    Functional Assessment Tool Used (Outpatient Only) DHI: 90%   Functional Limitation Self care   Self Care Goal Status ((B2010 At least 40 percent but less than 60 percent impaired, limited or restricted   Self Care Discharge Status [phone] At least 80 percent but less than 100 percent impaired, limited or restricted       Grigor Lipschutz L 82018/09/05 9:11 AM  [facility] [address]Phone: [phone]  Fax:   [phone]  JGeoffry Paradise PT,DPT 009-05-20189:12 AM Phone: [phone]: [phone]

## 2016-10-19 ENCOUNTER — Encounter: Payer: Medicare HMO | Admitting: Rehabilitative and Restorative Service Providers"

## 2016-12-13 DIAGNOSIS — Z1231 Encounter for screening mammogram for malignant neoplasm of breast: Secondary | ICD-10-CM | POA: Diagnosis not present

## 2017-01-08 DIAGNOSIS — H15002 Unspecified scleritis, left eye: Secondary | ICD-10-CM | POA: Diagnosis not present

## 2017-01-08 DIAGNOSIS — Z961 Presence of intraocular lens: Secondary | ICD-10-CM | POA: Diagnosis not present

## 2017-01-08 DIAGNOSIS — H3342 Traction detachment of retina, left eye: Secondary | ICD-10-CM | POA: Diagnosis not present

## 2017-03-01 DIAGNOSIS — M810 Age-related osteoporosis without current pathological fracture: Secondary | ICD-10-CM | POA: Diagnosis not present

## 2017-03-01 DIAGNOSIS — Z23 Encounter for immunization: Secondary | ICD-10-CM | POA: Diagnosis not present

## 2017-03-01 DIAGNOSIS — Z6838 Body mass index (BMI) 38.0-38.9, adult: Secondary | ICD-10-CM | POA: Diagnosis not present

## 2017-03-01 DIAGNOSIS — E559 Vitamin D deficiency, unspecified: Secondary | ICD-10-CM | POA: Diagnosis not present

## 2017-03-01 DIAGNOSIS — E785 Hyperlipidemia, unspecified: Secondary | ICD-10-CM | POA: Diagnosis not present

## 2017-03-01 DIAGNOSIS — F339 Major depressive disorder, recurrent, unspecified: Secondary | ICD-10-CM | POA: Diagnosis not present

## 2017-03-01 DIAGNOSIS — Z0000 Encounter for general adult medical examination without abnormal findings: Secondary | ICD-10-CM | POA: Diagnosis not present

## 2017-03-01 DIAGNOSIS — I10 Essential (primary) hypertension: Secondary | ICD-10-CM | POA: Diagnosis not present

## 2017-03-05 DIAGNOSIS — R7301 Impaired fasting glucose: Secondary | ICD-10-CM | POA: Diagnosis not present

## 2017-03-08 DIAGNOSIS — M810 Age-related osteoporosis without current pathological fracture: Secondary | ICD-10-CM | POA: Diagnosis not present

## 2017-03-11 DIAGNOSIS — M1612 Unilateral primary osteoarthritis, left hip: Secondary | ICD-10-CM | POA: Diagnosis not present

## 2017-03-11 DIAGNOSIS — Z96642 Presence of left artificial hip joint: Secondary | ICD-10-CM | POA: Diagnosis not present

## 2017-04-15 ENCOUNTER — Ambulatory Visit: Payer: Medicare HMO | Admitting: Neurology

## 2017-04-16 ENCOUNTER — Encounter: Payer: Self-pay | Admitting: Neurology

## 2017-04-23 DIAGNOSIS — Z79899 Other long term (current) drug therapy: Secondary | ICD-10-CM | POA: Diagnosis not present

## 2017-04-23 DIAGNOSIS — E785 Hyperlipidemia, unspecified: Secondary | ICD-10-CM | POA: Diagnosis not present

## 2017-04-24 ENCOUNTER — Encounter (INDEPENDENT_AMBULATORY_CARE_PROVIDER_SITE_OTHER): Payer: Self-pay

## 2017-04-24 ENCOUNTER — Ambulatory Visit: Payer: Medicare HMO | Admitting: Neurology

## 2017-04-24 ENCOUNTER — Encounter: Payer: Self-pay | Admitting: Neurology

## 2017-04-24 VITALS — BP 121/70 | HR 79 | Wt 231.5 lb

## 2017-04-24 DIAGNOSIS — G501 Atypical facial pain: Secondary | ICD-10-CM

## 2017-04-24 MED ORDER — LEVETIRACETAM 250 MG PO TABS: 250.0000 mg | ORAL_TABLET | Freq: Two times a day (BID) | ORAL | 3 refills | Status: DC

## 2017-04-24 MED ORDER — CARBAMAZEPINE 200 MG PO TABS: ORAL_TABLET | ORAL | 3 refills | Status: DC

## 2017-04-24 NOTE — Patient Instructions (Signed)
   We will start on keppra 250 mg twice a day. Call for any dose adjustments.
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
## 2017-04-29 DIAGNOSIS — M8588 Other specified disorders of bone density and structure, other site: Secondary | ICD-10-CM | POA: Diagnosis not present

## 2017-04-29 DIAGNOSIS — M810 Age-related osteoporosis without current pathological fracture: Secondary | ICD-10-CM | POA: Diagnosis not present

## 2017-05-13 DIAGNOSIS — M25511 Pain in right shoulder: Secondary | ICD-10-CM | POA: Diagnosis not present

## 2017-05-13 DIAGNOSIS — Z96642 Presence of left artificial hip joint: Secondary | ICD-10-CM | POA: Diagnosis not present

## 2017-06-10 ENCOUNTER — Telehealth: Payer: Self-pay | Admitting: Neurology

## 2017-06-10 NOTE — Telephone Encounter (Signed)
The patient has blindness and some gait instability, I will sign application for a handicap sticker for the car, temporary for 6 months.

## 2017-06-10 NOTE — Telephone Encounter (Signed)
Dr Willis- please advise 

## 2017-06-10 NOTE — Telephone Encounter (Signed)
Pt called stating that she has been losing her balance and falling more often for about a month now. Pt would like to know if she could have a new handicap sticker. Please call to advise

## 2017-06-11 NOTE — Telephone Encounter (Signed)
Called pt. Advised Dr. Jannifer Franklin agreed to fill out form for her. Ready for pick up at our office at her convenience. She verbalized understanding. Placed up front for pick up.

## 2017-06-12 ENCOUNTER — Other Ambulatory Visit: Payer: Self-pay | Admitting: Adult Health

## 2017-06-17 ENCOUNTER — Other Ambulatory Visit: Payer: Self-pay | Admitting: Adult Health

## 2017-06-18 ENCOUNTER — Other Ambulatory Visit: Payer: Self-pay | Admitting: Adult Health

## 2017-07-01 ENCOUNTER — Other Ambulatory Visit: Payer: Self-pay

## 2017-07-01 ENCOUNTER — Emergency Department (HOSPITAL_COMMUNITY): Payer: Medicare HMO

## 2017-07-01 ENCOUNTER — Emergency Department (HOSPITAL_COMMUNITY)
Admission: EM | Admit: 2017-07-01 | Discharge: 2017-07-01 | Disposition: A | Discharge status: Home / Self Care | Payer: Medicare HMO | Attending: Emergency Medicine | Admitting: Emergency Medicine

## 2017-07-01 DIAGNOSIS — Y929 Unspecified place or not applicable: Secondary | ICD-10-CM | POA: Insufficient documentation

## 2017-07-01 DIAGNOSIS — Z96642 Presence of left artificial hip joint: Secondary | ICD-10-CM | POA: Diagnosis not present

## 2017-07-01 DIAGNOSIS — W19XXXA Unspecified fall, initial encounter: Secondary | ICD-10-CM

## 2017-07-01 DIAGNOSIS — Y9389 Activity, other specified: Secondary | ICD-10-CM | POA: Diagnosis not present

## 2017-07-01 DIAGNOSIS — Y999 Unspecified external cause status: Secondary | ICD-10-CM | POA: Insufficient documentation

## 2017-07-01 DIAGNOSIS — S42021A Displaced fracture of shaft of right clavicle, initial encounter for closed fracture: Secondary | ICD-10-CM

## 2017-07-01 DIAGNOSIS — W06XXXA Fall from bed, initial encounter: Secondary | ICD-10-CM | POA: Diagnosis not present

## 2017-07-01 DIAGNOSIS — S42024A Nondisplaced fracture of shaft of right clavicle, initial encounter for closed fracture: Secondary | ICD-10-CM | POA: Diagnosis not present

## 2017-07-01 DIAGNOSIS — Z79899 Other long term (current) drug therapy: Secondary | ICD-10-CM | POA: Insufficient documentation

## 2017-07-01 DIAGNOSIS — I10 Essential (primary) hypertension: Secondary | ICD-10-CM | POA: Insufficient documentation

## 2017-07-01 DIAGNOSIS — S42031A Displaced fracture of lateral end of right clavicle, initial encounter for closed fracture: Secondary | ICD-10-CM | POA: Diagnosis not present

## 2017-07-01 DIAGNOSIS — M25511 Pain in right shoulder: Secondary | ICD-10-CM | POA: Diagnosis not present

## 2017-07-01 DIAGNOSIS — T148XXA Other injury of unspecified body region, initial encounter: Secondary | ICD-10-CM | POA: Diagnosis not present

## 2017-07-01 MED ORDER — HYDROMORPHONE HCL 2 MG/ML IJ SOLN
1.0000 mg | Freq: Once | INTRAMUSCULAR | Status: AC
  Administered 2017-07-01: 1 mg via INTRAVENOUS
  Filled 2017-07-01: qty 1

## 2017-07-01 MED ORDER — OXYCODONE-ACETAMINOPHEN 5-325 MG PO TABS: 1.0000 | ORAL_TABLET | ORAL | 0 refills | Status: DC | PRN

## 2017-07-01 NOTE — ED Triage Notes (Signed)
Pt rolled out of bed tonight onto the floor, c/o R shoulder pain, tender on palpation to R clavicle and shoulder. EMS gave Fentanyl enroute with improvement of pain

## 2017-07-01 NOTE — Discharge Instructions (Addendum)
You have a broken clavicle.  Please take Percocet as needed at home for pain.  This medicine can make you drowsy so please do not drive or work while taking it.  Please schedule an appointment for follow up with the bone doctor (Dr. Griffin Basil), his information is listed below.

## 2017-07-01 NOTE — ED Notes (Signed)
PT states understanding of care given, follow up care, and medication prescribed. PT ambulated from ED to car with a steady gait. 
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
## 2017-07-04 DIAGNOSIS — S42021A Displaced fracture of shaft of right clavicle, initial encounter for closed fracture: Secondary | ICD-10-CM | POA: Diagnosis not present

## 2017-07-04 DIAGNOSIS — S42001A Fracture of unspecified part of right clavicle, initial encounter for closed fracture: Secondary | ICD-10-CM | POA: Diagnosis not present

## 2017-07-05 HISTORY — PX: FRACTURE SURGERY: SHX138

## 2017-07-16 DIAGNOSIS — H5440 Blindness, one eye, unspecified eye: Secondary | ICD-10-CM | POA: Diagnosis not present

## 2017-07-16 DIAGNOSIS — R29818 Other symptoms and signs involving the nervous system: Secondary | ICD-10-CM | POA: Diagnosis not present

## 2017-07-16 DIAGNOSIS — H532 Diplopia: Secondary | ICD-10-CM | POA: Diagnosis not present

## 2017-07-16 DIAGNOSIS — R296 Repeated falls: Secondary | ICD-10-CM | POA: Diagnosis not present

## 2017-07-16 DIAGNOSIS — S42021D Displaced fracture of shaft of right clavicle, subsequent encounter for fracture with routine healing: Secondary | ICD-10-CM | POA: Diagnosis not present

## 2017-07-17 DIAGNOSIS — F419 Anxiety disorder, unspecified: Secondary | ICD-10-CM | POA: Diagnosis not present

## 2017-07-17 DIAGNOSIS — R296 Repeated falls: Secondary | ICD-10-CM | POA: Diagnosis not present

## 2017-07-17 DIAGNOSIS — F329 Major depressive disorder, single episode, unspecified: Secondary | ICD-10-CM | POA: Diagnosis not present

## 2017-07-17 DIAGNOSIS — E785 Hyperlipidemia, unspecified: Secondary | ICD-10-CM | POA: Diagnosis not present

## 2017-07-17 DIAGNOSIS — H532 Diplopia: Secondary | ICD-10-CM | POA: Diagnosis not present

## 2017-07-17 DIAGNOSIS — R7303 Prediabetes: Secondary | ICD-10-CM | POA: Diagnosis not present

## 2017-07-17 DIAGNOSIS — I10 Essential (primary) hypertension: Secondary | ICD-10-CM | POA: Diagnosis not present

## 2017-07-17 DIAGNOSIS — Z7982 Long term (current) use of aspirin: Secondary | ICD-10-CM | POA: Diagnosis not present

## 2017-07-17 DIAGNOSIS — M810 Age-related osteoporosis without current pathological fracture: Secondary | ICD-10-CM | POA: Diagnosis not present

## 2017-07-17 DIAGNOSIS — S42001D Fracture of unspecified part of right clavicle, subsequent encounter for fracture with routine healing: Secondary | ICD-10-CM | POA: Diagnosis not present

## 2017-07-17 DIAGNOSIS — S42021D Displaced fracture of shaft of right clavicle, subsequent encounter for fracture with routine healing: Secondary | ICD-10-CM | POA: Diagnosis not present

## 2017-07-17 DIAGNOSIS — H548 Legal blindness, as defined in USA: Secondary | ICD-10-CM | POA: Diagnosis not present

## 2017-07-18 ENCOUNTER — Telehealth: Payer: Self-pay | Admitting: *Deleted

## 2017-07-18 DIAGNOSIS — Z0289 Encounter for other administrative examinations: Secondary | ICD-10-CM

## 2017-07-18 NOTE — Telephone Encounter (Signed)
[employer] disability forms placed on Dr Jannifer Franklin' desk for review, completion, signature.

## 2017-07-19 DIAGNOSIS — I10 Essential (primary) hypertension: Secondary | ICD-10-CM | POA: Diagnosis not present

## 2017-07-19 DIAGNOSIS — S42021D Displaced fracture of shaft of right clavicle, subsequent encounter for fracture with routine healing: Secondary | ICD-10-CM | POA: Diagnosis not present

## 2017-07-19 DIAGNOSIS — H548 Legal blindness, as defined in USA: Secondary | ICD-10-CM | POA: Diagnosis not present

## 2017-07-19 DIAGNOSIS — R296 Repeated falls: Secondary | ICD-10-CM | POA: Diagnosis not present

## 2017-07-19 DIAGNOSIS — R269 Unspecified abnormalities of gait and mobility: Secondary | ICD-10-CM | POA: Diagnosis not present

## 2017-07-19 DIAGNOSIS — F419 Anxiety disorder, unspecified: Secondary | ICD-10-CM | POA: Diagnosis not present

## 2017-07-19 DIAGNOSIS — F329 Major depressive disorder, single episode, unspecified: Secondary | ICD-10-CM | POA: Diagnosis not present

## 2017-07-19 DIAGNOSIS — E785 Hyperlipidemia, unspecified: Secondary | ICD-10-CM | POA: Diagnosis not present

## 2017-07-19 DIAGNOSIS — M810 Age-related osteoporosis without current pathological fracture: Secondary | ICD-10-CM | POA: Diagnosis not present

## 2017-07-19 DIAGNOSIS — R7303 Prediabetes: Secondary | ICD-10-CM | POA: Diagnosis not present

## 2017-07-22 DIAGNOSIS — R296 Repeated falls: Secondary | ICD-10-CM | POA: Diagnosis not present

## 2017-07-22 DIAGNOSIS — M810 Age-related osteoporosis without current pathological fracture: Secondary | ICD-10-CM | POA: Diagnosis not present

## 2017-07-22 DIAGNOSIS — S42021D Displaced fracture of shaft of right clavicle, subsequent encounter for fracture with routine healing: Secondary | ICD-10-CM | POA: Diagnosis not present

## 2017-07-22 DIAGNOSIS — I10 Essential (primary) hypertension: Secondary | ICD-10-CM | POA: Diagnosis not present

## 2017-07-22 DIAGNOSIS — F329 Major depressive disorder, single episode, unspecified: Secondary | ICD-10-CM | POA: Diagnosis not present

## 2017-07-22 DIAGNOSIS — F419 Anxiety disorder, unspecified: Secondary | ICD-10-CM | POA: Diagnosis not present

## 2017-07-22 DIAGNOSIS — E785 Hyperlipidemia, unspecified: Secondary | ICD-10-CM | POA: Diagnosis not present

## 2017-07-22 DIAGNOSIS — R7303 Prediabetes: Secondary | ICD-10-CM | POA: Diagnosis not present

## 2017-07-22 DIAGNOSIS — H548 Legal blindness, as defined in USA: Secondary | ICD-10-CM | POA: Diagnosis not present

## 2017-07-23 ENCOUNTER — Telehealth: Payer: Self-pay | Admitting: *Deleted

## 2017-07-23 DIAGNOSIS — E785 Hyperlipidemia, unspecified: Secondary | ICD-10-CM | POA: Diagnosis not present

## 2017-07-23 DIAGNOSIS — I10 Essential (primary) hypertension: Secondary | ICD-10-CM | POA: Diagnosis not present

## 2017-07-23 DIAGNOSIS — F419 Anxiety disorder, unspecified: Secondary | ICD-10-CM | POA: Diagnosis not present

## 2017-07-23 DIAGNOSIS — H548 Legal blindness, as defined in USA: Secondary | ICD-10-CM | POA: Diagnosis not present

## 2017-07-23 DIAGNOSIS — F329 Major depressive disorder, single episode, unspecified: Secondary | ICD-10-CM | POA: Diagnosis not present

## 2017-07-23 DIAGNOSIS — R296 Repeated falls: Secondary | ICD-10-CM | POA: Diagnosis not present

## 2017-07-23 DIAGNOSIS — R7303 Prediabetes: Secondary | ICD-10-CM | POA: Diagnosis not present

## 2017-07-23 DIAGNOSIS — S42021D Displaced fracture of shaft of right clavicle, subsequent encounter for fracture with routine healing: Secondary | ICD-10-CM | POA: Diagnosis not present

## 2017-07-23 DIAGNOSIS — M810 Age-related osteoporosis without current pathological fracture: Secondary | ICD-10-CM | POA: Diagnosis not present

## 2017-07-23 NOTE — Telephone Encounter (Signed)
I faxed pt [employer] form to [phone] on 07/23/17

## 2017-07-25 DIAGNOSIS — M810 Age-related osteoporosis without current pathological fracture: Secondary | ICD-10-CM | POA: Diagnosis not present

## 2017-07-25 DIAGNOSIS — R296 Repeated falls: Secondary | ICD-10-CM | POA: Diagnosis not present

## 2017-07-25 DIAGNOSIS — F329 Major depressive disorder, single episode, unspecified: Secondary | ICD-10-CM | POA: Diagnosis not present

## 2017-07-25 DIAGNOSIS — H548 Legal blindness, as defined in USA: Secondary | ICD-10-CM | POA: Diagnosis not present

## 2017-07-25 DIAGNOSIS — I10 Essential (primary) hypertension: Secondary | ICD-10-CM | POA: Diagnosis not present

## 2017-07-25 DIAGNOSIS — E785 Hyperlipidemia, unspecified: Secondary | ICD-10-CM | POA: Diagnosis not present

## 2017-07-25 DIAGNOSIS — F419 Anxiety disorder, unspecified: Secondary | ICD-10-CM | POA: Diagnosis not present

## 2017-07-25 DIAGNOSIS — S42021D Displaced fracture of shaft of right clavicle, subsequent encounter for fracture with routine healing: Secondary | ICD-10-CM | POA: Diagnosis not present

## 2017-07-25 DIAGNOSIS — R7303 Prediabetes: Secondary | ICD-10-CM | POA: Diagnosis not present

## 2017-07-29 DIAGNOSIS — F419 Anxiety disorder, unspecified: Secondary | ICD-10-CM | POA: Diagnosis not present

## 2017-07-29 DIAGNOSIS — I10 Essential (primary) hypertension: Secondary | ICD-10-CM | POA: Diagnosis not present

## 2017-07-29 DIAGNOSIS — R296 Repeated falls: Secondary | ICD-10-CM | POA: Diagnosis not present

## 2017-07-29 DIAGNOSIS — M810 Age-related osteoporosis without current pathological fracture: Secondary | ICD-10-CM | POA: Diagnosis not present

## 2017-07-29 DIAGNOSIS — E785 Hyperlipidemia, unspecified: Secondary | ICD-10-CM | POA: Diagnosis not present

## 2017-07-29 DIAGNOSIS — F329 Major depressive disorder, single episode, unspecified: Secondary | ICD-10-CM | POA: Diagnosis not present

## 2017-07-29 DIAGNOSIS — S42021D Displaced fracture of shaft of right clavicle, subsequent encounter for fracture with routine healing: Secondary | ICD-10-CM | POA: Diagnosis not present

## 2017-07-29 DIAGNOSIS — R7303 Prediabetes: Secondary | ICD-10-CM | POA: Diagnosis not present

## 2017-07-29 DIAGNOSIS — H548 Legal blindness, as defined in USA: Secondary | ICD-10-CM | POA: Diagnosis not present

## 2017-07-30 DIAGNOSIS — F329 Major depressive disorder, single episode, unspecified: Secondary | ICD-10-CM | POA: Diagnosis not present

## 2017-07-30 DIAGNOSIS — H548 Legal blindness, as defined in USA: Secondary | ICD-10-CM | POA: Diagnosis not present

## 2017-07-30 DIAGNOSIS — E785 Hyperlipidemia, unspecified: Secondary | ICD-10-CM | POA: Diagnosis not present

## 2017-07-30 DIAGNOSIS — R7303 Prediabetes: Secondary | ICD-10-CM | POA: Diagnosis not present

## 2017-07-30 DIAGNOSIS — F419 Anxiety disorder, unspecified: Secondary | ICD-10-CM | POA: Diagnosis not present

## 2017-07-30 DIAGNOSIS — S42021D Displaced fracture of shaft of right clavicle, subsequent encounter for fracture with routine healing: Secondary | ICD-10-CM | POA: Diagnosis not present

## 2017-07-30 DIAGNOSIS — M810 Age-related osteoporosis without current pathological fracture: Secondary | ICD-10-CM | POA: Diagnosis not present

## 2017-07-30 DIAGNOSIS — R296 Repeated falls: Secondary | ICD-10-CM | POA: Diagnosis not present

## 2017-07-30 DIAGNOSIS — I10 Essential (primary) hypertension: Secondary | ICD-10-CM | POA: Diagnosis not present

## 2017-07-31 DIAGNOSIS — R296 Repeated falls: Secondary | ICD-10-CM | POA: Diagnosis not present

## 2017-07-31 DIAGNOSIS — R7303 Prediabetes: Secondary | ICD-10-CM | POA: Diagnosis not present

## 2017-07-31 DIAGNOSIS — S42021D Displaced fracture of shaft of right clavicle, subsequent encounter for fracture with routine healing: Secondary | ICD-10-CM | POA: Diagnosis not present

## 2017-07-31 DIAGNOSIS — E785 Hyperlipidemia, unspecified: Secondary | ICD-10-CM | POA: Diagnosis not present

## 2017-07-31 DIAGNOSIS — F329 Major depressive disorder, single episode, unspecified: Secondary | ICD-10-CM | POA: Diagnosis not present

## 2017-07-31 DIAGNOSIS — F419 Anxiety disorder, unspecified: Secondary | ICD-10-CM | POA: Diagnosis not present

## 2017-07-31 DIAGNOSIS — I10 Essential (primary) hypertension: Secondary | ICD-10-CM | POA: Diagnosis not present

## 2017-07-31 DIAGNOSIS — M810 Age-related osteoporosis without current pathological fracture: Secondary | ICD-10-CM | POA: Diagnosis not present

## 2017-07-31 DIAGNOSIS — H548 Legal blindness, as defined in USA: Secondary | ICD-10-CM | POA: Diagnosis not present

## 2017-08-01 DIAGNOSIS — H548 Legal blindness, as defined in USA: Secondary | ICD-10-CM | POA: Diagnosis not present

## 2017-08-01 DIAGNOSIS — R296 Repeated falls: Secondary | ICD-10-CM | POA: Diagnosis not present

## 2017-08-01 DIAGNOSIS — E785 Hyperlipidemia, unspecified: Secondary | ICD-10-CM | POA: Diagnosis not present

## 2017-08-01 DIAGNOSIS — S42021D Displaced fracture of shaft of right clavicle, subsequent encounter for fracture with routine healing: Secondary | ICD-10-CM | POA: Diagnosis not present

## 2017-08-01 DIAGNOSIS — M810 Age-related osteoporosis without current pathological fracture: Secondary | ICD-10-CM | POA: Diagnosis not present

## 2017-08-01 DIAGNOSIS — R7303 Prediabetes: Secondary | ICD-10-CM | POA: Diagnosis not present

## 2017-08-01 DIAGNOSIS — F419 Anxiety disorder, unspecified: Secondary | ICD-10-CM | POA: Diagnosis not present

## 2017-08-01 DIAGNOSIS — I10 Essential (primary) hypertension: Secondary | ICD-10-CM | POA: Diagnosis not present

## 2017-08-01 DIAGNOSIS — F329 Major depressive disorder, single episode, unspecified: Secondary | ICD-10-CM | POA: Diagnosis not present

## 2017-08-05 DIAGNOSIS — M810 Age-related osteoporosis without current pathological fracture: Secondary | ICD-10-CM | POA: Diagnosis not present

## 2017-08-05 DIAGNOSIS — S42021D Displaced fracture of shaft of right clavicle, subsequent encounter for fracture with routine healing: Secondary | ICD-10-CM | POA: Diagnosis not present

## 2017-08-05 DIAGNOSIS — R296 Repeated falls: Secondary | ICD-10-CM | POA: Diagnosis not present

## 2017-08-05 DIAGNOSIS — H548 Legal blindness, as defined in USA: Secondary | ICD-10-CM | POA: Diagnosis not present

## 2017-08-05 DIAGNOSIS — R7303 Prediabetes: Secondary | ICD-10-CM | POA: Diagnosis not present

## 2017-08-05 DIAGNOSIS — E785 Hyperlipidemia, unspecified: Secondary | ICD-10-CM | POA: Diagnosis not present

## 2017-08-05 DIAGNOSIS — I10 Essential (primary) hypertension: Secondary | ICD-10-CM | POA: Diagnosis not present

## 2017-08-05 DIAGNOSIS — F419 Anxiety disorder, unspecified: Secondary | ICD-10-CM | POA: Diagnosis not present

## 2017-08-05 DIAGNOSIS — F329 Major depressive disorder, single episode, unspecified: Secondary | ICD-10-CM | POA: Diagnosis not present

## 2017-08-08 DIAGNOSIS — F329 Major depressive disorder, single episode, unspecified: Secondary | ICD-10-CM | POA: Diagnosis not present

## 2017-08-08 DIAGNOSIS — R7303 Prediabetes: Secondary | ICD-10-CM | POA: Diagnosis not present

## 2017-08-08 DIAGNOSIS — E785 Hyperlipidemia, unspecified: Secondary | ICD-10-CM | POA: Diagnosis not present

## 2017-08-08 DIAGNOSIS — F419 Anxiety disorder, unspecified: Secondary | ICD-10-CM | POA: Diagnosis not present

## 2017-08-08 DIAGNOSIS — R296 Repeated falls: Secondary | ICD-10-CM | POA: Diagnosis not present

## 2017-08-08 DIAGNOSIS — I10 Essential (primary) hypertension: Secondary | ICD-10-CM | POA: Diagnosis not present

## 2017-08-08 DIAGNOSIS — H548 Legal blindness, as defined in USA: Secondary | ICD-10-CM | POA: Diagnosis not present

## 2017-08-08 DIAGNOSIS — M810 Age-related osteoporosis without current pathological fracture: Secondary | ICD-10-CM | POA: Diagnosis not present

## 2017-08-08 DIAGNOSIS — S42021D Displaced fracture of shaft of right clavicle, subsequent encounter for fracture with routine healing: Secondary | ICD-10-CM | POA: Diagnosis not present

## 2017-08-09 DIAGNOSIS — I10 Essential (primary) hypertension: Secondary | ICD-10-CM | POA: Diagnosis not present

## 2017-08-09 DIAGNOSIS — H548 Legal blindness, as defined in USA: Secondary | ICD-10-CM | POA: Diagnosis not present

## 2017-08-09 DIAGNOSIS — F329 Major depressive disorder, single episode, unspecified: Secondary | ICD-10-CM | POA: Diagnosis not present

## 2017-08-09 DIAGNOSIS — S42021D Displaced fracture of shaft of right clavicle, subsequent encounter for fracture with routine healing: Secondary | ICD-10-CM | POA: Diagnosis not present

## 2017-08-09 DIAGNOSIS — R7303 Prediabetes: Secondary | ICD-10-CM | POA: Diagnosis not present

## 2017-08-09 DIAGNOSIS — E785 Hyperlipidemia, unspecified: Secondary | ICD-10-CM | POA: Diagnosis not present

## 2017-08-09 DIAGNOSIS — F419 Anxiety disorder, unspecified: Secondary | ICD-10-CM | POA: Diagnosis not present

## 2017-08-09 DIAGNOSIS — R296 Repeated falls: Secondary | ICD-10-CM | POA: Diagnosis not present

## 2017-08-09 DIAGNOSIS — M810 Age-related osteoporosis without current pathological fracture: Secondary | ICD-10-CM | POA: Diagnosis not present

## 2017-08-12 DIAGNOSIS — I10 Essential (primary) hypertension: Secondary | ICD-10-CM | POA: Diagnosis not present

## 2017-08-12 DIAGNOSIS — S42021D Displaced fracture of shaft of right clavicle, subsequent encounter for fracture with routine healing: Secondary | ICD-10-CM | POA: Diagnosis not present

## 2017-08-12 DIAGNOSIS — F419 Anxiety disorder, unspecified: Secondary | ICD-10-CM | POA: Diagnosis not present

## 2017-08-12 DIAGNOSIS — R296 Repeated falls: Secondary | ICD-10-CM | POA: Diagnosis not present

## 2017-08-12 DIAGNOSIS — R7303 Prediabetes: Secondary | ICD-10-CM | POA: Diagnosis not present

## 2017-08-12 DIAGNOSIS — F329 Major depressive disorder, single episode, unspecified: Secondary | ICD-10-CM | POA: Diagnosis not present

## 2017-08-12 DIAGNOSIS — M810 Age-related osteoporosis without current pathological fracture: Secondary | ICD-10-CM | POA: Diagnosis not present

## 2017-08-12 DIAGNOSIS — E785 Hyperlipidemia, unspecified: Secondary | ICD-10-CM | POA: Diagnosis not present

## 2017-08-12 DIAGNOSIS — H548 Legal blindness, as defined in USA: Secondary | ICD-10-CM | POA: Diagnosis not present

## 2017-08-14 DIAGNOSIS — S42001D Fracture of unspecified part of right clavicle, subsequent encounter for fracture with routine healing: Secondary | ICD-10-CM | POA: Diagnosis not present

## 2017-08-15 DIAGNOSIS — E785 Hyperlipidemia, unspecified: Secondary | ICD-10-CM | POA: Diagnosis not present

## 2017-08-15 DIAGNOSIS — M810 Age-related osteoporosis without current pathological fracture: Secondary | ICD-10-CM | POA: Diagnosis not present

## 2017-08-15 DIAGNOSIS — R7303 Prediabetes: Secondary | ICD-10-CM | POA: Diagnosis not present

## 2017-08-15 DIAGNOSIS — F419 Anxiety disorder, unspecified: Secondary | ICD-10-CM | POA: Diagnosis not present

## 2017-08-15 DIAGNOSIS — S42021D Displaced fracture of shaft of right clavicle, subsequent encounter for fracture with routine healing: Secondary | ICD-10-CM | POA: Diagnosis not present

## 2017-08-15 DIAGNOSIS — F329 Major depressive disorder, single episode, unspecified: Secondary | ICD-10-CM | POA: Diagnosis not present

## 2017-08-15 DIAGNOSIS — H548 Legal blindness, as defined in USA: Secondary | ICD-10-CM | POA: Diagnosis not present

## 2017-08-15 DIAGNOSIS — R296 Repeated falls: Secondary | ICD-10-CM | POA: Diagnosis not present

## 2017-08-15 DIAGNOSIS — I10 Essential (primary) hypertension: Secondary | ICD-10-CM | POA: Diagnosis not present

## 2017-08-16 DIAGNOSIS — M810 Age-related osteoporosis without current pathological fracture: Secondary | ICD-10-CM | POA: Diagnosis not present

## 2017-08-16 DIAGNOSIS — F329 Major depressive disorder, single episode, unspecified: Secondary | ICD-10-CM | POA: Diagnosis not present

## 2017-08-16 DIAGNOSIS — R296 Repeated falls: Secondary | ICD-10-CM | POA: Diagnosis not present

## 2017-08-16 DIAGNOSIS — E785 Hyperlipidemia, unspecified: Secondary | ICD-10-CM | POA: Diagnosis not present

## 2017-08-16 DIAGNOSIS — I10 Essential (primary) hypertension: Secondary | ICD-10-CM | POA: Diagnosis not present

## 2017-08-16 DIAGNOSIS — S42021D Displaced fracture of shaft of right clavicle, subsequent encounter for fracture with routine healing: Secondary | ICD-10-CM | POA: Diagnosis not present

## 2017-08-16 DIAGNOSIS — H548 Legal blindness, as defined in USA: Secondary | ICD-10-CM | POA: Diagnosis not present

## 2017-08-16 DIAGNOSIS — R7303 Prediabetes: Secondary | ICD-10-CM | POA: Diagnosis not present

## 2017-08-16 DIAGNOSIS — F419 Anxiety disorder, unspecified: Secondary | ICD-10-CM | POA: Diagnosis not present

## 2017-08-19 DIAGNOSIS — F329 Major depressive disorder, single episode, unspecified: Secondary | ICD-10-CM | POA: Diagnosis not present

## 2017-08-19 DIAGNOSIS — M810 Age-related osteoporosis without current pathological fracture: Secondary | ICD-10-CM | POA: Diagnosis not present

## 2017-08-19 DIAGNOSIS — R7303 Prediabetes: Secondary | ICD-10-CM | POA: Diagnosis not present

## 2017-08-19 DIAGNOSIS — E785 Hyperlipidemia, unspecified: Secondary | ICD-10-CM | POA: Diagnosis not present

## 2017-08-19 DIAGNOSIS — S42021D Displaced fracture of shaft of right clavicle, subsequent encounter for fracture with routine healing: Secondary | ICD-10-CM | POA: Diagnosis not present

## 2017-08-19 DIAGNOSIS — R296 Repeated falls: Secondary | ICD-10-CM | POA: Diagnosis not present

## 2017-08-19 DIAGNOSIS — F419 Anxiety disorder, unspecified: Secondary | ICD-10-CM | POA: Diagnosis not present

## 2017-08-19 DIAGNOSIS — H548 Legal blindness, as defined in USA: Secondary | ICD-10-CM | POA: Diagnosis not present

## 2017-08-19 DIAGNOSIS — I10 Essential (primary) hypertension: Secondary | ICD-10-CM | POA: Diagnosis not present

## 2017-08-21 DIAGNOSIS — I10 Essential (primary) hypertension: Secondary | ICD-10-CM | POA: Diagnosis not present

## 2017-08-21 DIAGNOSIS — R7303 Prediabetes: Secondary | ICD-10-CM | POA: Diagnosis not present

## 2017-08-21 DIAGNOSIS — M810 Age-related osteoporosis without current pathological fracture: Secondary | ICD-10-CM | POA: Diagnosis not present

## 2017-08-21 DIAGNOSIS — H548 Legal blindness, as defined in USA: Secondary | ICD-10-CM | POA: Diagnosis not present

## 2017-08-21 DIAGNOSIS — F419 Anxiety disorder, unspecified: Secondary | ICD-10-CM | POA: Diagnosis not present

## 2017-08-21 DIAGNOSIS — E785 Hyperlipidemia, unspecified: Secondary | ICD-10-CM | POA: Diagnosis not present

## 2017-08-21 DIAGNOSIS — R296 Repeated falls: Secondary | ICD-10-CM | POA: Diagnosis not present

## 2017-08-21 DIAGNOSIS — S42021D Displaced fracture of shaft of right clavicle, subsequent encounter for fracture with routine healing: Secondary | ICD-10-CM | POA: Diagnosis not present

## 2017-08-21 DIAGNOSIS — F329 Major depressive disorder, single episode, unspecified: Secondary | ICD-10-CM | POA: Diagnosis not present

## 2017-08-23 DIAGNOSIS — R296 Repeated falls: Secondary | ICD-10-CM | POA: Diagnosis not present

## 2017-08-23 DIAGNOSIS — I10 Essential (primary) hypertension: Secondary | ICD-10-CM | POA: Diagnosis not present

## 2017-08-23 DIAGNOSIS — S42021D Displaced fracture of shaft of right clavicle, subsequent encounter for fracture with routine healing: Secondary | ICD-10-CM | POA: Diagnosis not present

## 2017-08-23 DIAGNOSIS — H548 Legal blindness, as defined in USA: Secondary | ICD-10-CM | POA: Diagnosis not present

## 2017-08-23 DIAGNOSIS — M810 Age-related osteoporosis without current pathological fracture: Secondary | ICD-10-CM | POA: Diagnosis not present

## 2017-08-23 DIAGNOSIS — E785 Hyperlipidemia, unspecified: Secondary | ICD-10-CM | POA: Diagnosis not present

## 2017-08-23 DIAGNOSIS — F419 Anxiety disorder, unspecified: Secondary | ICD-10-CM | POA: Diagnosis not present

## 2017-08-23 DIAGNOSIS — F329 Major depressive disorder, single episode, unspecified: Secondary | ICD-10-CM | POA: Diagnosis not present

## 2017-08-23 DIAGNOSIS — R7303 Prediabetes: Secondary | ICD-10-CM | POA: Diagnosis not present

## 2017-08-28 DIAGNOSIS — S42021D Displaced fracture of shaft of right clavicle, subsequent encounter for fracture with routine healing: Secondary | ICD-10-CM | POA: Diagnosis not present

## 2017-08-28 DIAGNOSIS — E785 Hyperlipidemia, unspecified: Secondary | ICD-10-CM | POA: Diagnosis not present

## 2017-08-28 DIAGNOSIS — R296 Repeated falls: Secondary | ICD-10-CM | POA: Diagnosis not present

## 2017-08-28 DIAGNOSIS — R7303 Prediabetes: Secondary | ICD-10-CM | POA: Diagnosis not present

## 2017-08-28 DIAGNOSIS — F419 Anxiety disorder, unspecified: Secondary | ICD-10-CM | POA: Diagnosis not present

## 2017-08-28 DIAGNOSIS — I10 Essential (primary) hypertension: Secondary | ICD-10-CM | POA: Diagnosis not present

## 2017-08-28 DIAGNOSIS — F329 Major depressive disorder, single episode, unspecified: Secondary | ICD-10-CM | POA: Diagnosis not present

## 2017-08-28 DIAGNOSIS — M810 Age-related osteoporosis without current pathological fracture: Secondary | ICD-10-CM | POA: Diagnosis not present

## 2017-08-28 DIAGNOSIS — H548 Legal blindness, as defined in USA: Secondary | ICD-10-CM | POA: Diagnosis not present

## 2017-08-29 DIAGNOSIS — E785 Hyperlipidemia, unspecified: Secondary | ICD-10-CM | POA: Diagnosis not present

## 2017-08-29 DIAGNOSIS — R7303 Prediabetes: Secondary | ICD-10-CM | POA: Diagnosis not present

## 2017-08-29 DIAGNOSIS — H548 Legal blindness, as defined in USA: Secondary | ICD-10-CM | POA: Diagnosis not present

## 2017-08-29 DIAGNOSIS — F419 Anxiety disorder, unspecified: Secondary | ICD-10-CM | POA: Diagnosis not present

## 2017-08-29 DIAGNOSIS — S42021D Displaced fracture of shaft of right clavicle, subsequent encounter for fracture with routine healing: Secondary | ICD-10-CM | POA: Diagnosis not present

## 2017-08-29 DIAGNOSIS — M810 Age-related osteoporosis without current pathological fracture: Secondary | ICD-10-CM | POA: Diagnosis not present

## 2017-08-29 DIAGNOSIS — F329 Major depressive disorder, single episode, unspecified: Secondary | ICD-10-CM | POA: Diagnosis not present

## 2017-08-29 DIAGNOSIS — R296 Repeated falls: Secondary | ICD-10-CM | POA: Diagnosis not present

## 2017-08-29 DIAGNOSIS — I10 Essential (primary) hypertension: Secondary | ICD-10-CM | POA: Diagnosis not present

## 2017-08-30 DIAGNOSIS — M79602 Pain in left arm: Secondary | ICD-10-CM | POA: Diagnosis not present

## 2017-08-30 DIAGNOSIS — R61 Generalized hyperhidrosis: Secondary | ICD-10-CM | POA: Diagnosis not present

## 2017-09-09 DIAGNOSIS — M810 Age-related osteoporosis without current pathological fracture: Secondary | ICD-10-CM | POA: Diagnosis not present

## 2017-09-10 DIAGNOSIS — H15002 Unspecified scleritis, left eye: Secondary | ICD-10-CM | POA: Diagnosis not present

## 2017-09-10 DIAGNOSIS — H3342 Traction detachment of retina, left eye: Secondary | ICD-10-CM | POA: Diagnosis not present

## 2017-09-10 DIAGNOSIS — Z961 Presence of intraocular lens: Secondary | ICD-10-CM | POA: Diagnosis not present

## 2017-09-12 ENCOUNTER — Telehealth: Payer: Self-pay

## 2017-09-12 NOTE — Telephone Encounter (Signed)
SENT REFERRAL TO SCHEDULING AND FILED NOTES 

## 2017-09-13 DIAGNOSIS — S42001D Fracture of unspecified part of right clavicle, subsequent encounter for fracture with routine healing: Secondary | ICD-10-CM | POA: Diagnosis not present

## 2017-09-24 ENCOUNTER — Encounter: Payer: Self-pay | Admitting: Cardiovascular Disease

## 2017-09-24 ENCOUNTER — Ambulatory Visit: Payer: Medicare HMO | Admitting: Cardiovascular Disease

## 2017-09-24 DIAGNOSIS — E7800 Pure hypercholesterolemia, unspecified: Secondary | ICD-10-CM | POA: Diagnosis not present

## 2017-09-24 DIAGNOSIS — I10 Essential (primary) hypertension: Secondary | ICD-10-CM

## 2017-09-24 DIAGNOSIS — R0789 Other chest pain: Secondary | ICD-10-CM | POA: Diagnosis not present

## 2017-09-24 NOTE — Progress Notes (Signed)
09/24/2017 Summer [name]   11-21-1955  [id number]  Primary Physician Harlan Stains, MD Primary Cardiologist: Lorretta Harp MD Summer [name], [place]  HPI:  Summer [name] is a 62 y.o. moderately overweight divorced African-American female mother of 1 child who currently does not work.  She was referred by Serina Cowper PA-C for evaluation of left arm pain.  Risk factors include treated hypertension and hyperlipidemia.  She is never smoked.  Mother did have a myocardial infarction age 53.  She has GERD as well.  She complains of daily substernal chest pain lasting up to 5 minutes at a time.  She recently had an episode of left upper extremity pain associated with nausea and diaphoresis.   Current Meds  Medication Sig  . aspirin 81 MG tablet Take 81 mg by mouth daily.  Marland Kitchen atorvastatin (LIPITOR) 20 MG tablet Take 40 mg by mouth every evening.   . calcium gluconate 500 MG tablet Take 1 tablet by mouth daily.  . carbamazepine (EPITOL) 200 MG tablet 1.5 tablets in the morning and 2 in the evening  . carvedilol (COREG) 3.125 MG tablet Take 3.125 mg by mouth 2 (two) times daily.  . Cholecalciferol (VITAMIN D3) 1000 units CAPS Take 1,000 Units by mouth daily.  . cyclobenzaprine (FLEXERIL) 10 MG tablet Take 1 tablet (10 mg total) by mouth 3 (three) times daily as needed for muscle spasms.  . DULoxetine (CYMBALTA) 60 MG capsule Take 1 capsule (60 mg total) by mouth 2 (two) times daily. (Patient taking differently: Take 120 mg by mouth 2 (two) times daily. )  . LORazepam (ATIVAN) 0.5 MG tablet Take 0.5 mg by mouth 3 (three) times daily as needed for anxiety.  Marland Kitchen PRESCRIPTION MEDICATION Inject 1 Dose as directed every 6 (six) months. Potassium injection     Allergies  Allergen Reactions  . Gabapentin     Intolerance, anxiety  . Tape Other (See Comments)    Plastic tape only.  REACTION:  Skin redness.  . Chlorhexidine Itching    REACTION:  Skin redness    Social History    Socioeconomic History  . Marital status: Divorced    Spouse name: Not on file  . Number of children: 1  . Years of education: 37  . Highest education level: Not on file  Occupational History  . Occupation: disabled  Social Needs  . Financial resource strain: Not on file  . Food insecurity:    Worry: Not on file    Inability: Not on file  . Transportation needs:    Medical: Not on file    Non-medical: Not on file  Tobacco Use  . Smoking status: Never Smoker  . Smokeless tobacco: Never Used  Substance and Sexual Activity  . Alcohol use: No  . Drug use: No  . Sexual activity: Not on file  Lifestyle  . Physical activity:    Days per week: Not on file    Minutes per session: Not on file  . Stress: Not on file  Relationships  . Social connections:    Talks on phone: Not on file    Gets together: Not on file    Attends religious service: Not on file    Active member of club or organization: Not on file    Attends meetings of clubs or organizations: Not on file    Relationship status: Not on file  . Intimate partner violence:    Fear of current or ex partner: Not on  file    Emotionally abused: Not on file    Physically abused: Not on file    Forced sexual activity: Not on file  Other Topics Concern  . Not on file  Social History Narrative   Divorced mother of one. Disabled due to blindness.   Does not drink. Does not smoke/has never smoked.   Right handed.   Caffeine: 2 coke cola daily.     Review of Systems: General: negative for chills, fever, night sweats or weight changes.  Cardiovascular: negative for chest pain, dyspnea on exertion, edema, orthopnea, palpitations, paroxysmal nocturnal dyspnea or shortness of breath Dermatological: negative for rash Respiratory: negative for cough or wheezing Urologic: negative for hematuria Abdominal: negative for nausea, vomiting, diarrhea, bright red blood per rectum, melena, or hematemesis Neurologic: negative for visual  changes, syncope, or dizziness All other systems reviewed and are otherwise negative except as noted above.    Blood pressure 124/84, pulse 84, height 5\' 5"  (1.651 m), weight 225 lb (102.1 kg).  General appearance: alert and no distress Neck: no adenopathy, no carotid bruit, no JVD, supple, symmetrical, trachea midline and thyroid not enlarged, symmetric, no tenderness/mass/nodules Lungs: clear to auscultation bilaterally Heart: regular rate and rhythm, S1, S2 normal, no murmur, click, rub or gallop Extremities: extremities normal, atraumatic, no cyanosis or edema Pulses: 2+ and symmetric Skin: Skin color, texture, turgor normal. No rashes or lesions Neurologic: Alert and oriented X 3, normal strength and tone. Normal symmetric reflexes. Normal coordination and gait  EKG sinus rhythm at 84 with minimal voltage for LVH and nonspecific ST and T wave changes.  I personally reviewed this EKG.  ASSESSMENT AND PLAN:   Hypertension History of essential hypertension blood pressure measured today at 124/84.  She is on carvedilol.  Continue current meds at current dosing  Hyperlipidemia History of hyperlipidemia on statin therapy with recent lipid profile performed 04/23/2017 revealing a total cholesterol 180, LDL 103 HDL of 64.  Atypical chest pain History of GERD with daily substernal chest pain and recent episode of left upper extremity pain associated with nausea and diaphoresis risk factors include treated hypertension, hyperlipidemia and family history with a mother who had a myocardial infarct at age 24.  I am going to get an exercise Myoview stress test to risk stratify her.      Lorretta Harp MD FACP,FACC,FAHA, [facility] 09/24/2017 1:31 PM

## 2017-09-24 NOTE — Assessment & Plan Note (Signed)
History of essential hypertension blood pressure measured today at 124/84.  She is on carvedilol.  Continue current meds at current dosing

## 2017-09-24 NOTE — Patient Instructions (Addendum)
Medication Instructions:  Your physician recommends that you continue on your current medications as directed. Please refer to the Current Medication list given to you today.   Labwork: none  Testing/Procedures: Your physician has requested that you have en exercise stress myoview. For further information please visit [email or web address]. Please follow instruction sheet, as given.  Medication to HOLD: NONE  Follow-Up: Follow up with Dr. Gwenlyn Found as needed.   Any Other Special Instructions Will Be Listed Below (If Applicable).     If you need a refill on your cardiac medications before your next appointment, please call your pharmacy.

## 2017-09-24 NOTE — Assessment & Plan Note (Signed)
History of hyperlipidemia on statin therapy with recent lipid profile performed 04/23/2017 revealing a total cholesterol 180, LDL 103 HDL of 64.

## 2017-09-24 NOTE — Assessment & Plan Note (Signed)
History of GERD with daily substernal chest pain and recent episode of left upper extremity pain associated with nausea and diaphoresis risk factors include treated hypertension, hyperlipidemia and family history with a mother who had a myocardial infarct at age 60.  I am going to get an exercise Myoview stress test to risk stratify her.

## 2017-09-26 NOTE — Addendum Note (Signed)
Addended by: Venetia Maxon on: 09/26/2017 09:05 AM   Modules accepted: Orders

## 2017-10-01 ENCOUNTER — Telehealth (HOSPITAL_COMMUNITY): Payer: Self-pay

## 2017-10-01 NOTE — Telephone Encounter (Signed)
Encounter complete. 

## 2017-10-03 ENCOUNTER — Ambulatory Visit (HOSPITAL_COMMUNITY)
Admission: RE | Admit: 2017-10-03 | Discharge: 2017-10-03 | Disposition: A | Discharge status: Home / Self Care | Payer: Medicare HMO | Source: Ambulatory Visit | Attending: Cardiology | Admitting: Cardiology

## 2017-10-03 DIAGNOSIS — R0789 Other chest pain: Secondary | ICD-10-CM | POA: Diagnosis not present

## 2017-10-03 MED ORDER — TECHNETIUM TC 99M TETROFOSMIN IV KIT
31.2000 | PACK | Freq: Once | INTRAVENOUS | Status: AC | PRN
  Administered 2017-10-03: 31.2 via INTRAVENOUS
  Filled 2017-10-03: qty 32

## 2017-10-03 MED ORDER — REGADENOSON 0.4 MG/5ML IV SOLN: 0.4000 mg | Freq: Once | INTRAVENOUS | Status: DC

## 2017-10-03 MED ORDER — TECHNETIUM TC 99M TETROFOSMIN IV KIT
10.5000 | PACK | Freq: Once | INTRAVENOUS | Status: DC | PRN
  Filled 2017-10-03: qty 11

## 2017-10-03 MED ORDER — TECHNETIUM TC 99M TETROFOSMIN IV KIT
31.3000 | PACK | Freq: Once | INTRAVENOUS | Status: DC | PRN
  Filled 2017-10-03: qty 32

## 2017-10-04 ENCOUNTER — Ambulatory Visit (HOSPITAL_COMMUNITY)
Admission: RE | Admit: 2017-10-04 | Discharge: 2017-10-04 | Disposition: A | Discharge status: Home / Self Care | Payer: Medicare HMO | Source: Ambulatory Visit | Attending: Cardiology | Admitting: Cardiology

## 2017-10-04 LAB — MYOCARDIAL PERFUSION IMAGING
CHL CUP NUCLEAR SDS: 1
CSEPED: 5 min
CSEPEDS: 0 s
CSEPHR: 99 %
Estimated workload: 5.7 METS
LV sys vol: 36 mL
LVDIAVOL: 86 mL (ref 46–106)
MPHR: 161 {beats}/min
Peak HR: 160 {beats}/min
RPE: 18
Rest HR: 86 {beats}/min
SRS: 1
SSS: 2
TID: 0.91

## 2017-10-04 MED ORDER — TECHNETIUM TC 99M TETROFOSMIN IV KIT
30.1000 | PACK | Freq: Once | INTRAVENOUS | Status: AC | PRN
  Administered 2017-10-04: 30.1 via INTRAVENOUS

## 2017-10-14 DIAGNOSIS — S42001D Fracture of unspecified part of right clavicle, subsequent encounter for fracture with routine healing: Secondary | ICD-10-CM | POA: Diagnosis not present

## 2017-10-31 ENCOUNTER — Ambulatory Visit: Payer: Medicare HMO | Admitting: Adult Health

## 2017-10-31 ENCOUNTER — Encounter: Payer: Self-pay | Admitting: Adult Health

## 2017-10-31 VITALS — BP 135/73 | HR 78 | Ht 65.0 in | Wt 231.4 lb

## 2017-10-31 DIAGNOSIS — G501 Atypical facial pain: Secondary | ICD-10-CM

## 2017-10-31 DIAGNOSIS — Z5181 Encounter for therapeutic drug level monitoring: Secondary | ICD-10-CM | POA: Diagnosis not present

## 2017-10-31 MED ORDER — CARBAMAZEPINE 200 MG PO TABS: ORAL_TABLET | ORAL | 3 refills | Status: DC

## 2017-10-31 MED ORDER — DULOXETINE HCL 60 MG PO CPEP: 60.0000 mg | ORAL_CAPSULE | Freq: Two times a day (BID) | ORAL | 3 refills | Status: DC
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
## 2017-10-31 NOTE — Patient Instructions (Addendum)
Your Plan:  Continue Carbamazepine and Cymbalta Blood work today If your symptoms worsen or you develop new symptoms please let us know.   Thank you for coming to see [place] at [facility]. I hope we have been able to provide you high quality care today.  You may receive a patient satisfaction survey over the next few weeks. We would appreciate your feedback and comments so that we may continue to improve ourselves and the health of our patients.

## 2017-10-31 NOTE — Progress Notes (Signed)
I have read the note, and I agree with the clinical assessment and plan.  Honora Searson K Shenia Alan   

## 2017-11-01 ENCOUNTER — Telehealth: Payer: Self-pay | Admitting: *Deleted

## 2017-11-01 LAB — CBC WITH DIFFERENTIAL/PLATELET
Basophils Absolute: 0 10*3/uL (ref 0.0–0.2)
Basos: 0 %
EOS (ABSOLUTE): 0.1 10*3/uL (ref 0.0–0.4)
Eos: 2 %
HEMATOCRIT: 35.4 % (ref 34.0–46.6)
Hemoglobin: 11.5 g/dL (ref 11.1–15.9)
Immature Grans (Abs): 0 10*3/uL (ref 0.0–0.1)
Immature Granulocytes: 0 %
LYMPHS ABS: 1.4 10*3/uL (ref 0.7–3.1)
Lymphs: 28 %
MCH: 28.8 pg (ref 26.6–33.0)
MCHC: 32.5 g/dL (ref 31.5–35.7)
MCV: 89 fL (ref 79–97)
MONOS ABS: 0.4 10*3/uL (ref 0.1–0.9)
Monocytes: 8 %
NEUTROS ABS: 3 10*3/uL (ref 1.4–7.0)
NEUTROS PCT: 62 %
PLATELETS: 229 10*3/uL (ref 150–450)
RBC: 3.99 x10E6/uL (ref 3.77–5.28)
RDW: 14.5 % (ref 12.3–15.4)
WBC: 5 10*3/uL (ref 3.4–10.8)

## 2017-11-01 LAB — COMPREHENSIVE METABOLIC PANEL
A/G RATIO: 1.2 (ref 1.2–2.2)
ALBUMIN: 3.7 g/dL (ref 3.5–5.5)
ALT: 9 IU/L (ref 0–32)
AST: 18 IU/L (ref 0–40)
Alkaline Phosphatase: 133 IU/L — ABNORMAL HIGH (ref 39–117)
BUN / CREAT RATIO: 14 (ref 9–23)
BUN: 15 mg/dL (ref 6–24)
Bilirubin Total: <0.2 mg/dL (ref 0.0–1.2)
CALCIUM: 9.7 mg/dL (ref 8.7–10.2)
CO2: 24 mmol/L (ref 20–29)
Chloride: 105 mmol/L (ref 96–106)
Creatinine, Ser: 1.05 mg/dL — ABNORMAL HIGH (ref 0.57–1.00)
GFR, EST AFRICAN AMERICAN: 67 mL/min/{1.73_m2} (ref >59)
GFR, EST NON AFRICAN AMERICAN: 58 mL/min/{1.73_m2} — AB (ref >59)
GLOBULIN, TOTAL: 3.2 g/dL (ref 1.5–4.5)
Glucose: 94 mg/dL (ref 65–99)
POTASSIUM: 4.8 mmol/L (ref 3.5–5.2)
SODIUM: 142 mmol/L (ref 134–144)
Total Protein: 6.9 g/dL (ref 6.0–8.5)

## 2017-11-01 LAB — CARBAMAZEPINE LEVEL, TOTAL: Carbamazepine (Tegretol), S: 10.1 ug/mL (ref 4.0–12.0)

## 2017-11-01 NOTE — Telephone Encounter (Signed)
LVM requesting a call back Monday for results. Advised office is now closed; left number.

## 2017-11-05 NOTE — Telephone Encounter (Signed)
Received call back from patient asking for lab results. Advised her her labs are stable from previous labs. Tegretol is in therapeutic level. She verbalized understanding, appreciation.

## 2017-12-25 DIAGNOSIS — H52201 Unspecified astigmatism, right eye: Secondary | ICD-10-CM | POA: Diagnosis not present

## 2017-12-25 DIAGNOSIS — H524 Presbyopia: Secondary | ICD-10-CM | POA: Diagnosis not present

## 2017-12-28 ENCOUNTER — Other Ambulatory Visit: Payer: Self-pay | Admitting: Neurology

## 2018-01-30 DIAGNOSIS — J0110 Acute frontal sinusitis, unspecified: Secondary | ICD-10-CM | POA: Diagnosis not present

## 2018-03-05 DIAGNOSIS — I10 Essential (primary) hypertension: Secondary | ICD-10-CM | POA: Diagnosis not present

## 2018-03-05 DIAGNOSIS — F324 Major depressive disorder, single episode, in partial remission: Secondary | ICD-10-CM | POA: Diagnosis not present

## 2018-03-05 DIAGNOSIS — E785 Hyperlipidemia, unspecified: Secondary | ICD-10-CM | POA: Diagnosis not present

## 2018-03-05 DIAGNOSIS — E559 Vitamin D deficiency, unspecified: Secondary | ICD-10-CM | POA: Diagnosis not present

## 2018-03-05 DIAGNOSIS — M810 Age-related osteoporosis without current pathological fracture: Secondary | ICD-10-CM | POA: Diagnosis not present

## 2018-03-05 DIAGNOSIS — D649 Anemia, unspecified: Secondary | ICD-10-CM | POA: Diagnosis not present

## 2018-03-05 DIAGNOSIS — K219 Gastro-esophageal reflux disease without esophagitis: Secondary | ICD-10-CM | POA: Diagnosis not present

## 2018-03-05 DIAGNOSIS — R42 Dizziness and giddiness: Secondary | ICD-10-CM | POA: Diagnosis not present

## 2018-03-05 DIAGNOSIS — Z0000 Encounter for general adult medical examination without abnormal findings: Secondary | ICD-10-CM | POA: Diagnosis not present

## 2018-03-05 DIAGNOSIS — R7303 Prediabetes: Secondary | ICD-10-CM | POA: Diagnosis not present

## 2018-03-17 DIAGNOSIS — M810 Age-related osteoporosis without current pathological fracture: Secondary | ICD-10-CM | POA: Diagnosis not present

## 2018-04-01 DIAGNOSIS — Z1231 Encounter for screening mammogram for malignant neoplasm of breast: Secondary | ICD-10-CM | POA: Diagnosis not present

## 2018-04-14 DIAGNOSIS — N289 Disorder of kidney and ureter, unspecified: Secondary | ICD-10-CM | POA: Diagnosis not present

## 2018-04-14 DIAGNOSIS — I10 Essential (primary) hypertension: Secondary | ICD-10-CM | POA: Diagnosis not present

## 2018-04-14 DIAGNOSIS — K219 Gastro-esophageal reflux disease without esophagitis: Secondary | ICD-10-CM | POA: Diagnosis not present

## 2018-04-14 DIAGNOSIS — D649 Anemia, unspecified: Secondary | ICD-10-CM | POA: Diagnosis not present

## 2018-07-09 ENCOUNTER — Ambulatory Visit: Payer: Medicare HMO | Admitting: Neurology

## 2018-07-09 ENCOUNTER — Ambulatory Visit: Payer: Medicare HMO | Admitting: Adult Health

## 2018-07-23 DIAGNOSIS — Z86010 Personal history of colonic polyps: Secondary | ICD-10-CM | POA: Diagnosis not present

## 2018-07-23 DIAGNOSIS — K635 Polyp of colon: Secondary | ICD-10-CM | POA: Diagnosis not present

## 2018-07-23 DIAGNOSIS — K640 First degree hemorrhoids: Secondary | ICD-10-CM | POA: Diagnosis not present

## 2018-07-23 DIAGNOSIS — K5730 Diverticulosis of large intestine without perforation or abscess without bleeding: Secondary | ICD-10-CM | POA: Diagnosis not present

## 2018-07-29 DIAGNOSIS — K635 Polyp of colon: Secondary | ICD-10-CM | POA: Diagnosis not present

## 2018-08-05 ENCOUNTER — Ambulatory Visit: Payer: Medicare HMO | Admitting: Neurology

## 2018-08-13 DIAGNOSIS — F419 Anxiety disorder, unspecified: Secondary | ICD-10-CM | POA: Diagnosis not present

## 2018-08-13 DIAGNOSIS — K219 Gastro-esophageal reflux disease without esophagitis: Secondary | ICD-10-CM | POA: Diagnosis not present

## 2018-08-13 DIAGNOSIS — E785 Hyperlipidemia, unspecified: Secondary | ICD-10-CM | POA: Diagnosis not present

## 2018-08-13 DIAGNOSIS — I10 Essential (primary) hypertension: Secondary | ICD-10-CM | POA: Diagnosis not present

## 2018-08-15 DIAGNOSIS — M810 Age-related osteoporosis without current pathological fracture: Secondary | ICD-10-CM | POA: Diagnosis not present

## 2018-08-15 DIAGNOSIS — E785 Hyperlipidemia, unspecified: Secondary | ICD-10-CM | POA: Diagnosis not present

## 2018-08-15 DIAGNOSIS — I10 Essential (primary) hypertension: Secondary | ICD-10-CM | POA: Diagnosis not present

## 2018-08-15 DIAGNOSIS — F339 Major depressive disorder, recurrent, unspecified: Secondary | ICD-10-CM | POA: Diagnosis not present
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
## 2018-08-20 ENCOUNTER — Ambulatory Visit: Payer: Medicare HMO | Admitting: Neurology

## 2018-08-20 ENCOUNTER — Encounter: Payer: Self-pay | Admitting: Neurology

## 2018-08-20 ENCOUNTER — Other Ambulatory Visit: Payer: Self-pay

## 2018-08-20 ENCOUNTER — Telehealth: Payer: Self-pay | Admitting: *Deleted

## 2018-08-20 VITALS — BP 136/75 | HR 85 | Temp 97.5°F | Ht 65.5 in | Wt 238.0 lb

## 2018-08-20 DIAGNOSIS — G501 Atypical facial pain: Secondary | ICD-10-CM

## 2018-08-20 MED ORDER — CARBAMAZEPINE 200 MG PO TABS: ORAL_TABLET | ORAL | 3 refills | Status: DC

## 2018-08-20 MED ORDER — DULOXETINE HCL 60 MG PO CPEP: 60.0000 mg | ORAL_CAPSULE | Freq: Two times a day (BID) | ORAL | 3 refills | Status: DC

## 2018-08-20 MED ORDER — LEVETIRACETAM 250 MG PO TABS: 250.0000 mg | ORAL_TABLET | Freq: Two times a day (BID) | ORAL | 3 refills | Status: DC

## 2018-08-20 NOTE — Telephone Encounter (Signed)
The disability forms were signed.

## 2018-08-20 NOTE — Patient Instructions (Signed)
Start taking Keppra 250 mg tablet, twice daily   Levetiracetam tablets What is this medicine? LEVETIRACETAM (lee ve tye RA se tam) is an antiepileptic drug. It is used with other medicines to treat certain types of seizures. This medicine may be used for other purposes; ask your health care provider or pharmacist if you have questions. COMMON BRAND NAME(S): Keppra, Roweepra What should I tell my health care provider before I take this medicine? They need to know if you have any of these conditions:  kidney disease  suicidal thoughts, plans, or attempt; a previous suicide attempt by you or a family member  an unusual or allergic reaction to levetiracetam, other medicines, foods, dyes, or preservatives  pregnant or trying to get pregnant  breast-feeding How should I use this medicine? Take this medicine by mouth with a glass of water. Follow the directions on the prescription label. Swallow the tablets whole. Do not crush or chew this medicine. You may take this medicine with or without food. Take your doses at regular intervals. Do not take your medicine more often than directed. Do not stop taking this medicine or any of your seizure medicines unless instructed by your doctor or health care professional. Stopping your medicine suddenly can increase your seizures or their severity. A special MedGuide will be given to you by the pharmacist with each prescription and refill. Be sure to read this information carefully each time. Contact your pediatrician or health care professional regarding the use of this medication in children. While this drug may be prescribed for children as young as 61 years of age for selected conditions, precautions do apply. Overdosage: If you think you have taken too much of this medicine contact a poison control center or emergency room at once. NOTE: This medicine is only for you. Do not share this medicine with others. What if I miss a dose? If you miss a dose, take  it as soon as you can. If it is almost time for your next dose, take only that dose. Do not take double or extra doses. What may interact with this medicine? This medicine may interact with the following medications:  carbamazepine  colesevelam  probenecid  sevelamer This list may not describe all possible interactions. Give your health care provider a list of all the medicines, herbs, non-prescription drugs, or dietary supplements you use. Also tell them if you smoke, drink alcohol, or use illegal drugs. Some items may interact with your medicine. What should I watch for while using this medicine? Visit your doctor or health care provider for a regular check on your progress. Wear a medical identification bracelet or chain to say you have epilepsy, and carry a card that lists all your medications. This medicine may cause serious skin reactions. They can happen weeks to months after starting the medicine. Contact your health care provider right away if you notice fevers or flu-like symptoms with a rash. The rash may be red or purple and then turn into blisters or peeling of the skin. Or, you might notice a red rash with swelling of the face, lips or lymph nodes in your neck or under your arms. It is important to take this medicine exactly as instructed by your health care provider. When first starting treatment, your dose may need to be adjusted. It may take weeks or months before your dose is stable. You should contact your doctor or health care provider if your seizures get worse or if you have any new types of  seizures. You may get drowsy or dizzy. Do not drive, use machinery, or do anything that needs mental alertness until you know how this medicine affects you. Do not stand or sit up quickly, especially if you are an older patient. This reduces the risk of dizzy or fainting spells. Alcohol may interfere with the effect of this medicine. Avoid alcoholic drinks. The use of this medicine may  increase the chance of suicidal thoughts or actions. Pay special attention to how you are responding while on this medicine. Any worsening of mood, or thoughts of suicide or dying should be reported to your health care provider right away. Women who become pregnant while using this medicine may enroll in the [facility] Pregnancy Registry by calling [phone]. This registry collects information about the safety of antiepileptic drug use during pregnancy. What side effects may I notice from receiving this medicine? Side effects that you should report to your doctor or health care professional as soon as possible:  allergic reactions like skin rash, itching or hives, swelling of the face, lips, or tongue  breathing problems  dark urine  general ill feeling or flu-like symptoms  problems with balance, talking, walking  rash, fever, and swollen lymph nodes  redness, blistering, peeling or loosening of the skin, including inside the mouth  unusually weak or tired  worsening of mood, thoughts or actions of suicide or dying  yellowing of the eyes or skin Side effects that usually do not require medical attention (report to your doctor or health care professional if they continue or are bothersome):  diarrhea  dizzy, drowsy  headache  loss of appetite This list may not describe all possible side effects. Call your doctor for medical advice about side effects. You may report side effects to FDA at 1-800-FDA-1088. Where should I keep my medicine? Keep out of reach of children. Store at room temperature between 15 and 30 degrees C (59 and 86 degrees F). Throw away any unused medicine after the expiration date. NOTE: This sheet is a summary. It may not cover all possible information. If you have questions about this medicine, talk to your doctor, pharmacist, or health care provider.  2020 Elsevier/Gold Standard (2018-05-09 15:23:36)

## 2018-08-20 NOTE — Telephone Encounter (Addendum)
Received disability p/w to be filled out.  Completed, to be reviewed/finished/ signed. To Dr. Jannifer Franklin in box.

## 2018-08-20 NOTE — Progress Notes (Signed)
I have read the note, and I agree with the clinical assessment and plan.  Shanaia Sievers K Barnard Sharps   

## 2018-08-21 ENCOUNTER — Telehealth: Payer: Self-pay | Admitting: *Deleted

## 2018-08-21 DIAGNOSIS — Z0289 Encounter for other administrative examinations: Secondary | ICD-10-CM

## 2018-08-21 LAB — COMPREHENSIVE METABOLIC PANEL
ALT: 10 IU/L (ref 0–32)
AST: 15 IU/L (ref 0–40)
Albumin/Globulin Ratio: 1.2 (ref 1.2–2.2)
Albumin: 3.8 g/dL (ref 3.8–4.9)
Alkaline Phosphatase: 134 IU/L — ABNORMAL HIGH (ref 39–117)
BUN/Creatinine Ratio: 11 — ABNORMAL LOW (ref 12–28)
BUN: 10 mg/dL (ref 8–27)
Bilirubin Total: <0.2 mg/dL (ref 0.0–1.2)
CO2: 24 mmol/L (ref 20–29)
Calcium: 9.3 mg/dL (ref 8.7–10.3)
Chloride: 103 mmol/L (ref 96–106)
Creatinine, Ser: 0.94 mg/dL (ref 0.57–1.00)
GFR calc Af Amer: 76 mL/min/{1.73_m2} (ref >59)
GFR calc non Af Amer: 66 mL/min/{1.73_m2} (ref >59)
Globulin, Total: 3.2 g/dL (ref 1.5–4.5)
Glucose: 103 mg/dL — ABNORMAL HIGH (ref 65–99)
Potassium: 4.3 mmol/L (ref 3.5–5.2)
Sodium: 139 mmol/L (ref 134–144)
Total Protein: 7 g/dL (ref 6.0–8.5)

## 2018-08-21 LAB — CBC WITH DIFFERENTIAL/PLATELET
Basophils Absolute: 0 10*3/uL (ref 0.0–0.2)
Basos: 1 %
EOS (ABSOLUTE): 0.2 10*3/uL (ref 0.0–0.4)
Eos: 4 %
Hematocrit: 33.4 % — ABNORMAL LOW (ref 34.0–46.6)
Hemoglobin: 11.1 g/dL (ref 11.1–15.9)
Immature Grans (Abs): 0 10*3/uL (ref 0.0–0.1)
Immature Granulocytes: 0 %
Lymphocytes Absolute: 1.8 10*3/uL (ref 0.7–3.1)
Lymphs: 31 %
MCH: 28.4 pg (ref 26.6–33.0)
MCHC: 33.2 g/dL (ref 31.5–35.7)
MCV: 85 fL (ref 79–97)
Monocytes Absolute: 0.4 10*3/uL (ref 0.1–0.9)
Monocytes: 7 %
Neutrophils Absolute: 3.4 10*3/uL (ref 1.4–7.0)
Neutrophils: 57 %
Platelets: 234 10*3/uL (ref 150–450)
RBC: 3.91 x10E6/uL (ref 3.77–5.28)
RDW: 12.9 % (ref 11.7–15.4)
WBC: 5.8 10*3/uL (ref 3.4–10.8)

## 2018-08-21 LAB — CARBAMAZEPINE LEVEL, TOTAL: Carbamazepine (Tegretol), S: 9.9 ug/mL (ref 4.0–12.0)

## 2018-08-21 NOTE — Telephone Encounter (Signed)
Called pt.  Disability forms completed signed.  Will send to MR to fax and then mail copy to her as well as handicap placard form to her.

## 2018-08-21 NOTE — Telephone Encounter (Signed)
Spoke to pt and relayed that her lab results were stable. Carbamazepine level good.  She verbalized understanding.

## 2018-09-05 DIAGNOSIS — H3342 Traction detachment of retina, left eye: Secondary | ICD-10-CM | POA: Diagnosis not present

## 2018-09-05 DIAGNOSIS — H15002 Unspecified scleritis, left eye: Secondary | ICD-10-CM | POA: Diagnosis not present

## 2018-09-05 DIAGNOSIS — Z961 Presence of intraocular lens: Secondary | ICD-10-CM | POA: Diagnosis not present

## 2018-09-16 DIAGNOSIS — H15002 Unspecified scleritis, left eye: Secondary | ICD-10-CM | POA: Diagnosis not present

## 2018-09-17 DIAGNOSIS — K5900 Constipation, unspecified: Secondary | ICD-10-CM | POA: Diagnosis not present

## 2018-09-17 DIAGNOSIS — N644 Mastodynia: Secondary | ICD-10-CM | POA: Diagnosis not present

## 2018-09-17 DIAGNOSIS — J300 Vasomotor rhinitis: Secondary | ICD-10-CM | POA: Diagnosis not present

## 2018-09-17 DIAGNOSIS — I10 Essential (primary) hypertension: Secondary | ICD-10-CM | POA: Diagnosis not present

## 2018-09-17 DIAGNOSIS — R1310 Dysphagia, unspecified: Secondary | ICD-10-CM | POA: Diagnosis not present

## 2018-09-17 DIAGNOSIS — M810 Age-related osteoporosis without current pathological fracture: Secondary | ICD-10-CM | POA: Diagnosis not present

## 2018-09-19 DIAGNOSIS — H15002 Unspecified scleritis, left eye: Secondary | ICD-10-CM | POA: Diagnosis not present

## 2018-09-19 DIAGNOSIS — H3342 Traction detachment of retina, left eye: Secondary | ICD-10-CM | POA: Diagnosis not present

## 2018-10-07 DIAGNOSIS — I10 Essential (primary) hypertension: Secondary | ICD-10-CM | POA: Diagnosis not present

## 2018-10-07 DIAGNOSIS — E785 Hyperlipidemia, unspecified: Secondary | ICD-10-CM | POA: Diagnosis not present

## 2018-10-07 DIAGNOSIS — M810 Age-related osteoporosis without current pathological fracture: Secondary | ICD-10-CM | POA: Diagnosis not present

## 2018-10-07 DIAGNOSIS — F339 Major depressive disorder, recurrent, unspecified: Secondary | ICD-10-CM | POA: Diagnosis not present

## 2018-11-03 DIAGNOSIS — K219 Gastro-esophageal reflux disease without esophagitis: Secondary | ICD-10-CM | POA: Diagnosis not present

## 2018-11-03 DIAGNOSIS — R1310 Dysphagia, unspecified: Secondary | ICD-10-CM | POA: Diagnosis not present

## 2018-11-03 DIAGNOSIS — K5900 Constipation, unspecified: Secondary | ICD-10-CM | POA: Diagnosis not present

## 2018-11-11 DIAGNOSIS — Z1159 Encounter for screening for other viral diseases: Secondary | ICD-10-CM | POA: Diagnosis not present

## 2018-11-12 ENCOUNTER — Other Ambulatory Visit: Payer: Self-pay | Admitting: Neurology

## 2018-11-14 DIAGNOSIS — K219 Gastro-esophageal reflux disease without esophagitis: Secondary | ICD-10-CM | POA: Diagnosis not present

## 2018-11-14 DIAGNOSIS — K222 Esophageal obstruction: Secondary | ICD-10-CM | POA: Diagnosis not present

## 2018-11-14 DIAGNOSIS — R1310 Dysphagia, unspecified: Secondary | ICD-10-CM | POA: Diagnosis not present

## 2018-11-14 DIAGNOSIS — K2900 Acute gastritis without bleeding: Secondary | ICD-10-CM | POA: Diagnosis not present

## 2018-11-14 DIAGNOSIS — K319 Disease of stomach and duodenum, unspecified: Secondary | ICD-10-CM | POA: Diagnosis not present

## 2018-11-19 DIAGNOSIS — K319 Disease of stomach and duodenum, unspecified: Secondary | ICD-10-CM | POA: Diagnosis not present

## 2018-12-02 DIAGNOSIS — I10 Essential (primary) hypertension: Secondary | ICD-10-CM | POA: Diagnosis not present

## 2018-12-02 DIAGNOSIS — F339 Major depressive disorder, recurrent, unspecified: Secondary | ICD-10-CM | POA: Diagnosis not present

## 2018-12-02 DIAGNOSIS — M810 Age-related osteoporosis without current pathological fracture: Secondary | ICD-10-CM | POA: Diagnosis not present

## 2018-12-02 DIAGNOSIS — E785 Hyperlipidemia, unspecified: Secondary | ICD-10-CM | POA: Diagnosis not present
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
## 2018-12-22 ENCOUNTER — Other Ambulatory Visit: Payer: Self-pay

## 2018-12-22 ENCOUNTER — Ambulatory Visit: Payer: Medicare HMO | Admitting: Neurology

## 2018-12-22 ENCOUNTER — Encounter: Payer: Self-pay | Admitting: Neurology

## 2018-12-22 VITALS — BP 158/83 | HR 85 | Temp 97.5°F | Ht 65.5 in | Wt 224.0 lb

## 2018-12-22 DIAGNOSIS — G501 Atypical facial pain: Secondary | ICD-10-CM

## 2018-12-22 MED ORDER — LEVETIRACETAM 250 MG PO TABS: 250.0000 mg | ORAL_TABLET | Freq: Two times a day (BID) | ORAL | 1 refills | Status: DC

## 2018-12-22 NOTE — Patient Instructions (Signed)
Continue current medications    No changes today

## 2018-12-23 NOTE — Progress Notes (Signed)
I have read the note, and I agree with the clinical assessment and plan.  Brytni Dray K Conan Mcmanaway   

## 2018-12-29 ENCOUNTER — Ambulatory Visit: Payer: Medicare HMO | Admitting: Registered Nurse

## 2019-01-01 DIAGNOSIS — S90111A Contusion of right great toe without damage to nail, initial encounter: Secondary | ICD-10-CM | POA: Diagnosis not present

## 2019-01-01 DIAGNOSIS — I10 Essential (primary) hypertension: Secondary | ICD-10-CM | POA: Diagnosis not present

## 2019-01-05 DIAGNOSIS — M25571 Pain in right ankle and joints of right foot: Secondary | ICD-10-CM | POA: Diagnosis not present

## 2019-01-05 DIAGNOSIS — S42001D Fracture of unspecified part of right clavicle, subsequent encounter for fracture with routine healing: Secondary | ICD-10-CM | POA: Diagnosis not present

## 2019-01-14 DIAGNOSIS — I10 Essential (primary) hypertension: Secondary | ICD-10-CM | POA: Diagnosis not present

## 2019-01-14 DIAGNOSIS — E785 Hyperlipidemia, unspecified: Secondary | ICD-10-CM | POA: Diagnosis not present

## 2019-01-14 DIAGNOSIS — M810 Age-related osteoporosis without current pathological fracture: Secondary | ICD-10-CM | POA: Diagnosis not present

## 2019-01-14 DIAGNOSIS — F339 Major depressive disorder, recurrent, unspecified: Secondary | ICD-10-CM | POA: Diagnosis not present

## 2019-01-23 DIAGNOSIS — M25551 Pain in right hip: Secondary | ICD-10-CM | POA: Diagnosis not present

## 2019-01-23 DIAGNOSIS — M545 Low back pain: Secondary | ICD-10-CM | POA: Diagnosis not present

## 2019-02-07 DIAGNOSIS — E785 Hyperlipidemia, unspecified: Secondary | ICD-10-CM | POA: Diagnosis not present

## 2019-02-07 DIAGNOSIS — I10 Essential (primary) hypertension: Secondary | ICD-10-CM | POA: Diagnosis not present

## 2019-02-07 DIAGNOSIS — F339 Major depressive disorder, recurrent, unspecified: Secondary | ICD-10-CM | POA: Diagnosis not present

## 2019-02-07 DIAGNOSIS — M810 Age-related osteoporosis without current pathological fracture: Secondary | ICD-10-CM | POA: Diagnosis not present

## 2019-03-02 DIAGNOSIS — S42001D Fracture of unspecified part of right clavicle, subsequent encounter for fracture with routine healing: Secondary | ICD-10-CM | POA: Diagnosis not present

## 2019-03-02 DIAGNOSIS — M79674 Pain in right toe(s): Secondary | ICD-10-CM | POA: Diagnosis not present

## 2019-03-03 DIAGNOSIS — I10 Essential (primary) hypertension: Secondary | ICD-10-CM | POA: Diagnosis not present

## 2019-03-03 DIAGNOSIS — E785 Hyperlipidemia, unspecified: Secondary | ICD-10-CM | POA: Diagnosis not present

## 2019-03-03 DIAGNOSIS — R69 Illness, unspecified: Secondary | ICD-10-CM | POA: Diagnosis not present

## 2019-03-20 DIAGNOSIS — E785 Hyperlipidemia, unspecified: Secondary | ICD-10-CM | POA: Diagnosis not present

## 2019-03-20 DIAGNOSIS — Z1159 Encounter for screening for other viral diseases: Secondary | ICD-10-CM | POA: Diagnosis not present

## 2019-03-20 DIAGNOSIS — R69 Illness, unspecified: Secondary | ICD-10-CM | POA: Diagnosis not present

## 2019-03-20 DIAGNOSIS — E559 Vitamin D deficiency, unspecified: Secondary | ICD-10-CM | POA: Diagnosis not present

## 2019-03-20 DIAGNOSIS — Z0000 Encounter for general adult medical examination without abnormal findings: Secondary | ICD-10-CM | POA: Diagnosis not present

## 2019-03-20 DIAGNOSIS — I10 Essential (primary) hypertension: Secondary | ICD-10-CM | POA: Diagnosis not present

## 2019-03-20 DIAGNOSIS — R296 Repeated falls: Secondary | ICD-10-CM | POA: Diagnosis not present

## 2019-03-20 DIAGNOSIS — R7303 Prediabetes: Secondary | ICD-10-CM | POA: Diagnosis not present

## 2019-03-20 DIAGNOSIS — K219 Gastro-esophageal reflux disease without esophagitis: Secondary | ICD-10-CM | POA: Diagnosis not present

## 2019-03-23 DIAGNOSIS — R7303 Prediabetes: Secondary | ICD-10-CM | POA: Diagnosis not present

## 2019-03-23 DIAGNOSIS — M810 Age-related osteoporosis without current pathological fracture: Secondary | ICD-10-CM | POA: Diagnosis not present

## 2019-03-23 DIAGNOSIS — E785 Hyperlipidemia, unspecified: Secondary | ICD-10-CM | POA: Diagnosis not present

## 2019-03-23 DIAGNOSIS — E559 Vitamin D deficiency, unspecified: Secondary | ICD-10-CM | POA: Diagnosis not present

## 2019-03-23 DIAGNOSIS — Z1159 Encounter for screening for other viral diseases: Secondary | ICD-10-CM | POA: Diagnosis not present

## 2019-03-23 DIAGNOSIS — I10 Essential (primary) hypertension: Secondary | ICD-10-CM | POA: Diagnosis not present

## 2019-04-01 DIAGNOSIS — H5710 Ocular pain, unspecified eye: Secondary | ICD-10-CM | POA: Diagnosis not present

## 2019-04-01 DIAGNOSIS — E669 Obesity, unspecified: Secondary | ICD-10-CM | POA: Diagnosis not present

## 2019-04-01 DIAGNOSIS — N1831 Chronic kidney disease, stage 3a: Secondary | ICD-10-CM | POA: Diagnosis not present

## 2019-04-01 DIAGNOSIS — I129 Hypertensive chronic kidney disease with stage 1 through stage 4 chronic kidney disease, or unspecified chronic kidney disease: Secondary | ICD-10-CM | POA: Diagnosis not present

## 2019-04-01 DIAGNOSIS — R269 Unspecified abnormalities of gait and mobility: Secondary | ICD-10-CM | POA: Diagnosis not present

## 2019-04-01 DIAGNOSIS — K219 Gastro-esophageal reflux disease without esophagitis: Secondary | ICD-10-CM | POA: Diagnosis not present

## 2019-04-01 DIAGNOSIS — G8929 Other chronic pain: Secondary | ICD-10-CM | POA: Diagnosis not present

## 2019-04-01 DIAGNOSIS — R69 Illness, unspecified: Secondary | ICD-10-CM | POA: Diagnosis not present

## 2019-04-01 DIAGNOSIS — E785 Hyperlipidemia, unspecified: Secondary | ICD-10-CM | POA: Diagnosis not present

## 2019-04-01 DIAGNOSIS — J302 Other seasonal allergic rhinitis: Secondary | ICD-10-CM | POA: Diagnosis not present

## 2019-04-07 DIAGNOSIS — Z961 Presence of intraocular lens: Secondary | ICD-10-CM | POA: Diagnosis not present

## 2019-04-07 DIAGNOSIS — H15002 Unspecified scleritis, left eye: Secondary | ICD-10-CM | POA: Diagnosis not present

## 2019-04-07 DIAGNOSIS — H3342 Traction detachment of retina, left eye: Secondary | ICD-10-CM | POA: Diagnosis not present

## 2019-04-13 DIAGNOSIS — S92414A Nondisplaced fracture of proximal phalanx of right great toe, initial encounter for closed fracture: Secondary | ICD-10-CM | POA: Diagnosis not present

## 2019-05-18 DIAGNOSIS — I10 Essential (primary) hypertension: Secondary | ICD-10-CM | POA: Diagnosis not present

## 2019-05-18 DIAGNOSIS — E785 Hyperlipidemia, unspecified: Secondary | ICD-10-CM | POA: Diagnosis not present

## 2019-05-18 DIAGNOSIS — R69 Illness, unspecified: Secondary | ICD-10-CM | POA: Diagnosis not present

## 2019-05-28 ENCOUNTER — Emergency Department (HOSPITAL_COMMUNITY)
Admission: EM | Admit: 2019-05-28 | Discharge: 2019-05-28 | Disposition: A | Discharge status: Home / Self Care | Payer: Medicare HMO | Attending: Emergency Medicine | Admitting: Emergency Medicine

## 2019-05-28 ENCOUNTER — Encounter (HOSPITAL_COMMUNITY): Payer: Self-pay

## 2019-05-28 ENCOUNTER — Other Ambulatory Visit: Payer: Self-pay

## 2019-05-28 ENCOUNTER — Emergency Department (HOSPITAL_COMMUNITY)
Admission: EM | Admit: 2019-05-28 | Discharge: 2019-05-29 | Disposition: A | Discharge status: Home / Self Care | Payer: Medicare HMO | Source: Home / Self Care | Attending: Emergency Medicine | Admitting: Emergency Medicine

## 2019-05-28 DIAGNOSIS — I951 Orthostatic hypotension: Secondary | ICD-10-CM

## 2019-05-28 DIAGNOSIS — Z5321 Procedure and treatment not carried out due to patient leaving prior to being seen by health care provider: Secondary | ICD-10-CM | POA: Insufficient documentation

## 2019-05-28 DIAGNOSIS — R42 Dizziness and giddiness: Secondary | ICD-10-CM

## 2019-05-28 DIAGNOSIS — R231 Pallor: Secondary | ICD-10-CM | POA: Diagnosis not present

## 2019-05-28 DIAGNOSIS — R519 Headache, unspecified: Secondary | ICD-10-CM | POA: Insufficient documentation

## 2019-05-28 DIAGNOSIS — E860 Dehydration: Secondary | ICD-10-CM | POA: Diagnosis not present

## 2019-05-28 DIAGNOSIS — R55 Syncope and collapse: Secondary | ICD-10-CM | POA: Diagnosis not present

## 2019-05-28 LAB — BASIC METABOLIC PANEL
Anion gap: 9 (ref 5–15)
BUN: 13 mg/dL (ref 8–23)
CO2: 23 mmol/L (ref 22–32)
Calcium: 9 mg/dL (ref 8.9–10.3)
Chloride: 107 mmol/L (ref 98–111)
Creatinine, Ser: 1.11 mg/dL — ABNORMAL HIGH (ref 0.44–1.00)
GFR calc Af Amer: >60 mL/min (ref >60)
GFR calc non Af Amer: 54 mL/min — ABNORMAL LOW (ref >60)
Glucose, Bld: 113 mg/dL — ABNORMAL HIGH (ref 70–99)
Potassium: 4 mmol/L (ref 3.5–5.1)
Sodium: 139 mmol/L (ref 135–145)

## 2019-05-28 LAB — CBC
HCT: 38 % (ref 36.0–46.0)
Hemoglobin: 11.5 g/dL — ABNORMAL LOW (ref 12.0–15.0)
MCH: 28.3 pg (ref 26.0–34.0)
MCHC: 30.3 g/dL (ref 30.0–36.0)
MCV: 93.4 fL (ref 80.0–100.0)
Platelets: 260 10*3/uL (ref 150–400)
RBC: 4.07 MIL/uL (ref 3.87–5.11)
RDW: 12.4 % (ref 11.5–15.5)
WBC: 6.2 10*3/uL (ref 4.0–10.5)
nRBC: 0 % (ref 0.0–0.2)

## 2019-05-28 MED ORDER — SODIUM CHLORIDE 0.9% FLUSH: 3.0000 mL | Freq: Once | INTRAVENOUS | Status: DC

## 2019-05-28 MED ORDER — KETOROLAC TROMETHAMINE 15 MG/ML IJ SOLN
15.0000 mg | Freq: Once | INTRAMUSCULAR | Status: AC
  Administered 2019-05-29: 15 mg via INTRAVENOUS
  Filled 2019-05-28: qty 1

## 2019-05-28 MED ORDER — SODIUM CHLORIDE 0.9 % IV BOLUS
1000.0000 mL | Freq: Once | INTRAVENOUS | Status: AC
  Administered 2019-05-29: 1000 mL via INTRAVENOUS

## 2019-05-28 NOTE — ED Notes (Signed)
Pt went to [facility]

## 2019-05-28 NOTE — ED Notes (Signed)
Pt sts blood work obtained several hours ago at [facility] does not wish to be stuck again.

## 2019-05-28 NOTE — ED Notes (Signed)
EMS IV removed and pt stated she wants to leave due to wait time. Pt left with family member.

## 2019-05-28 NOTE — ED Triage Notes (Addendum)
To ED for eval of dizziness for since Tuesday. States her bp was 'low' this am at home. States she had severe eye pain with HA last pm. Now photophobia. No cp, no sob, no vomiting, no diarrhea, and no difficulty with urination. Appears in nad. Speech is clear. No neuro deficits.

## 2019-05-28 NOTE — ED Triage Notes (Signed)
Ongoing dizziness since last night. Near syncopal episode tonight. Called PCP who directed her to the er.

## 2019-05-29 DIAGNOSIS — I951 Orthostatic hypotension: Secondary | ICD-10-CM | POA: Diagnosis not present

## 2019-05-29 DIAGNOSIS — R42 Dizziness and giddiness: Secondary | ICD-10-CM | POA: Diagnosis not present

## 2019-05-29 DIAGNOSIS — J301 Allergic rhinitis due to pollen: Secondary | ICD-10-CM | POA: Diagnosis not present

## 2019-05-29 DIAGNOSIS — E860 Dehydration: Secondary | ICD-10-CM | POA: Diagnosis not present

## 2019-05-29 NOTE — ED Notes (Signed)
Pt walked 3 steps and said she could not do more. Pt placed in a chair. Pt orthostatics completed per provider. Pt reports nausea and dry heaving. Pt requests to sit in bedside chair and does want to be back in bed at this time. Pt unable to stand alone doing orthostatics.
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
## 2019-05-29 NOTE — ED Notes (Signed)
Pt helped back in bed plus one assistance

## 2019-06-02 DIAGNOSIS — I10 Essential (primary) hypertension: Secondary | ICD-10-CM | POA: Diagnosis not present

## 2019-06-22 ENCOUNTER — Encounter: Payer: Self-pay | Admitting: Neurology

## 2019-06-22 ENCOUNTER — Other Ambulatory Visit: Payer: Self-pay

## 2019-06-22 ENCOUNTER — Ambulatory Visit: Payer: Medicare HMO | Admitting: Neurology

## 2019-06-22 VITALS — BP 138/73 | HR 79 | Temp 98.7°F | Ht 68.0 in | Wt 227.0 lb

## 2019-06-22 DIAGNOSIS — G501 Atypical facial pain: Secondary | ICD-10-CM | POA: Diagnosis not present

## 2019-06-22 MED ORDER — CARBAMAZEPINE 200 MG PO TABS: ORAL_TABLET | ORAL | 3 refills | Status: DC

## 2019-06-22 MED ORDER — DULOXETINE HCL 60 MG PO CPEP: 60.0000 mg | ORAL_CAPSULE | Freq: Two times a day (BID) | ORAL | 3 refills | Status: DC

## 2019-06-22 MED ORDER — LEVETIRACETAM 250 MG PO TABS: 250.0000 mg | ORAL_TABLET | Freq: Two times a day (BID) | ORAL | 1 refills | Status: DC

## 2019-06-22 NOTE — Patient Instructions (Signed)
It was great to see you today! Continue current medications Check blood work  See you in 6 months
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
## 2019-06-23 ENCOUNTER — Telehealth: Payer: Self-pay

## 2019-06-23 LAB — CBC WITH DIFFERENTIAL/PLATELET
Basophils Absolute: 0.1 10*3/uL (ref 0.0–0.2)
Basos: 1 %
EOS (ABSOLUTE): 0.2 10*3/uL (ref 0.0–0.4)
Eos: 3 %
Hematocrit: 36 % (ref 34.0–46.6)
Hemoglobin: 11.9 g/dL (ref 11.1–15.9)
Immature Grans (Abs): 0 10*3/uL (ref 0.0–0.1)
Immature Granulocytes: 0 %
Lymphocytes Absolute: 1.1 10*3/uL (ref 0.7–3.1)
Lymphs: 19 %
MCH: 29.1 pg (ref 26.6–33.0)
MCHC: 33.1 g/dL (ref 31.5–35.7)
MCV: 88 fL (ref 79–97)
Monocytes Absolute: 0.4 10*3/uL (ref 0.1–0.9)
Monocytes: 7 %
Neutrophils Absolute: 4.2 10*3/uL (ref 1.4–7.0)
Neutrophils: 70 %
Platelets: 257 10*3/uL (ref 150–450)
RBC: 4.09 x10E6/uL (ref 3.77–5.28)
RDW: 12.3 % (ref 11.7–15.4)
WBC: 5.9 10*3/uL (ref 3.4–10.8)

## 2019-06-23 LAB — COMPREHENSIVE METABOLIC PANEL
ALT: 13 IU/L (ref 0–32)
AST: 23 IU/L (ref 0–40)
Albumin/Globulin Ratio: 1.2 (ref 1.2–2.2)
Albumin: 3.9 g/dL (ref 3.8–4.8)
Alkaline Phosphatase: 116 IU/L (ref 39–117)
BUN/Creatinine Ratio: 11 — ABNORMAL LOW (ref 12–28)
BUN: 12 mg/dL (ref 8–27)
Bilirubin Total: 0.6 mg/dL (ref 0.0–1.2)
CO2: 22 mmol/L (ref 20–29)
Calcium: 9 mg/dL (ref 8.7–10.3)
Chloride: 108 mmol/L — ABNORMAL HIGH (ref 96–106)
Creatinine, Ser: 1.11 mg/dL — ABNORMAL HIGH (ref 0.57–1.00)
GFR calc Af Amer: 62 mL/min/{1.73_m2} (ref >59)
GFR calc non Af Amer: 54 mL/min/{1.73_m2} — ABNORMAL LOW (ref >59)
Globulin, Total: 3.2 g/dL (ref 1.5–4.5)
Glucose: 109 mg/dL — ABNORMAL HIGH (ref 65–99)
Potassium: 4.6 mmol/L (ref 3.5–5.2)
Sodium: 142 mmol/L (ref 134–144)
Total Protein: 7.1 g/dL (ref 6.0–8.5)

## 2019-06-23 LAB — CARBAMAZEPINE LEVEL, TOTAL: Carbamazepine (Tegretol), S: <2 ug/mL — ABNORMAL LOW (ref 4.0–12.0)

## 2019-06-23 NOTE — Telephone Encounter (Signed)
Pt verified by name and DOB, results given per provider, pt voiced understanding all question answered.    -Pt states she has not been taking carbamazepine regularly for the last 2-3 weeks due to not feeling well. She is currently in the process of getting back on her regiment. Pt reports no pain.

## 2019-06-24 NOTE — Progress Notes (Signed)
I have read the note, and I agree with the clinical assessment and plan.  Shaquon Gropp K Maddie Brazier   

## 2019-07-02 DIAGNOSIS — R29818 Other symptoms and signs involving the nervous system: Secondary | ICD-10-CM | POA: Diagnosis not present

## 2019-07-02 DIAGNOSIS — R42 Dizziness and giddiness: Secondary | ICD-10-CM | POA: Diagnosis not present

## 2019-07-02 DIAGNOSIS — H532 Diplopia: Secondary | ICD-10-CM | POA: Diagnosis not present

## 2019-07-05 DIAGNOSIS — M810 Age-related osteoporosis without current pathological fracture: Secondary | ICD-10-CM | POA: Diagnosis not present

## 2019-07-05 DIAGNOSIS — N3281 Overactive bladder: Secondary | ICD-10-CM | POA: Diagnosis not present

## 2019-07-05 DIAGNOSIS — N183 Chronic kidney disease, stage 3 unspecified: Secondary | ICD-10-CM | POA: Diagnosis not present

## 2019-07-05 DIAGNOSIS — R1310 Dysphagia, unspecified: Secondary | ICD-10-CM | POA: Diagnosis not present

## 2019-07-05 DIAGNOSIS — F339 Major depressive disorder, recurrent, unspecified: Secondary | ICD-10-CM | POA: Diagnosis not present

## 2019-07-05 DIAGNOSIS — R7303 Prediabetes: Secondary | ICD-10-CM | POA: Diagnosis not present

## 2019-07-05 DIAGNOSIS — I129 Hypertensive chronic kidney disease with stage 1 through stage 4 chronic kidney disease, or unspecified chronic kidney disease: Secondary | ICD-10-CM | POA: Diagnosis not present

## 2019-07-05 DIAGNOSIS — H548 Legal blindness, as defined in USA: Secondary | ICD-10-CM | POA: Diagnosis not present

## 2019-07-05 DIAGNOSIS — F419 Anxiety disorder, unspecified: Secondary | ICD-10-CM | POA: Diagnosis not present

## 2019-07-07 DIAGNOSIS — N183 Chronic kidney disease, stage 3 unspecified: Secondary | ICD-10-CM | POA: Diagnosis not present

## 2019-07-07 DIAGNOSIS — N3281 Overactive bladder: Secondary | ICD-10-CM | POA: Diagnosis not present

## 2019-07-07 DIAGNOSIS — M810 Age-related osteoporosis without current pathological fracture: Secondary | ICD-10-CM | POA: Diagnosis not present

## 2019-07-07 DIAGNOSIS — H548 Legal blindness, as defined in USA: Secondary | ICD-10-CM | POA: Diagnosis not present

## 2019-07-07 DIAGNOSIS — R1310 Dysphagia, unspecified: Secondary | ICD-10-CM | POA: Diagnosis not present

## 2019-07-07 DIAGNOSIS — R7303 Prediabetes: Secondary | ICD-10-CM | POA: Diagnosis not present

## 2019-07-07 DIAGNOSIS — F419 Anxiety disorder, unspecified: Secondary | ICD-10-CM | POA: Diagnosis not present

## 2019-07-07 DIAGNOSIS — F339 Major depressive disorder, recurrent, unspecified: Secondary | ICD-10-CM | POA: Diagnosis not present

## 2019-07-07 DIAGNOSIS — I129 Hypertensive chronic kidney disease with stage 1 through stage 4 chronic kidney disease, or unspecified chronic kidney disease: Secondary | ICD-10-CM | POA: Diagnosis not present

## 2019-07-08 DIAGNOSIS — M810 Age-related osteoporosis without current pathological fracture: Secondary | ICD-10-CM | POA: Diagnosis not present

## 2019-07-08 DIAGNOSIS — R7303 Prediabetes: Secondary | ICD-10-CM | POA: Diagnosis not present

## 2019-07-08 DIAGNOSIS — N183 Chronic kidney disease, stage 3 unspecified: Secondary | ICD-10-CM | POA: Diagnosis not present

## 2019-07-08 DIAGNOSIS — H548 Legal blindness, as defined in USA: Secondary | ICD-10-CM | POA: Diagnosis not present

## 2019-07-08 DIAGNOSIS — I129 Hypertensive chronic kidney disease with stage 1 through stage 4 chronic kidney disease, or unspecified chronic kidney disease: Secondary | ICD-10-CM | POA: Diagnosis not present

## 2019-07-08 DIAGNOSIS — F339 Major depressive disorder, recurrent, unspecified: Secondary | ICD-10-CM | POA: Diagnosis not present

## 2019-07-08 DIAGNOSIS — N3281 Overactive bladder: Secondary | ICD-10-CM | POA: Diagnosis not present

## 2019-07-08 DIAGNOSIS — R1310 Dysphagia, unspecified: Secondary | ICD-10-CM | POA: Diagnosis not present

## 2019-07-08 DIAGNOSIS — F419 Anxiety disorder, unspecified: Secondary | ICD-10-CM | POA: Diagnosis not present

## 2019-07-09 DIAGNOSIS — I129 Hypertensive chronic kidney disease with stage 1 through stage 4 chronic kidney disease, or unspecified chronic kidney disease: Secondary | ICD-10-CM | POA: Diagnosis not present

## 2019-07-09 DIAGNOSIS — R7303 Prediabetes: Secondary | ICD-10-CM | POA: Diagnosis not present

## 2019-07-09 DIAGNOSIS — H548 Legal blindness, as defined in USA: Secondary | ICD-10-CM | POA: Diagnosis not present

## 2019-07-09 DIAGNOSIS — F419 Anxiety disorder, unspecified: Secondary | ICD-10-CM | POA: Diagnosis not present

## 2019-07-09 DIAGNOSIS — N3281 Overactive bladder: Secondary | ICD-10-CM | POA: Diagnosis not present

## 2019-07-09 DIAGNOSIS — M810 Age-related osteoporosis without current pathological fracture: Secondary | ICD-10-CM | POA: Diagnosis not present

## 2019-07-09 DIAGNOSIS — N183 Chronic kidney disease, stage 3 unspecified: Secondary | ICD-10-CM | POA: Diagnosis not present

## 2019-07-09 DIAGNOSIS — F339 Major depressive disorder, recurrent, unspecified: Secondary | ICD-10-CM | POA: Diagnosis not present

## 2019-07-09 DIAGNOSIS — R1310 Dysphagia, unspecified: Secondary | ICD-10-CM | POA: Diagnosis not present

## 2019-07-10 ENCOUNTER — Telehealth: Payer: Self-pay | Admitting: Radiology

## 2019-07-10 ENCOUNTER — Encounter: Payer: Self-pay | Admitting: Cardiovascular Disease

## 2019-07-10 ENCOUNTER — Other Ambulatory Visit: Payer: Self-pay

## 2019-07-10 ENCOUNTER — Ambulatory Visit: Payer: Medicare HMO | Admitting: Cardiovascular Disease

## 2019-07-10 VITALS — BP 136/60 | HR 85 | Ht 65.0 in | Wt 229.8 lb

## 2019-07-10 DIAGNOSIS — R0789 Other chest pain: Secondary | ICD-10-CM | POA: Diagnosis not present

## 2019-07-10 DIAGNOSIS — R42 Dizziness and giddiness: Secondary | ICD-10-CM | POA: Diagnosis not present

## 2019-07-10 DIAGNOSIS — E782 Mixed hyperlipidemia: Secondary | ICD-10-CM | POA: Diagnosis not present

## 2019-07-10 NOTE — Assessment & Plan Note (Signed)
History of hyperlipidemia on statin therapy with lipid profile performed 03/23/2019 revealing a total cholesterol of 192, and HDL of 68.

## 2019-07-10 NOTE — Patient Instructions (Signed)
Medication Instructions:  The current medical regimen is effective;  continue present plan and medications.  *If you need a refill on your cardiac medications before your next appointment, please call your pharmacy*   Testing/Procedures: Your physician has recommended that you wear a 14 DAY ZIO-PATCH monitor. The Zio patch cardiac monitor continuously records heart rhythm data for up to 14 days, this is for patients being evaluated for multiple types heart rhythms. For the first 24 hours post application, please avoid getting the Zio monitor wet in the shower or by excessive sweating during exercise. After that, feel free to carry on with regular activities. Keep soaps and lotions away from the ZIO XT Patch.  This will be mailed to you, please expect 7-10 days to receive.         Follow-Up: At [facility], you and your health needs are our priority.  As part of our continuing mission to provide you with exceptional heart care, we have created designated Provider Care Teams.  These Care Teams include your primary Cardiologist (physician) and Advanced Practice Providers (APPs -  Physician Assistants and Nurse Practitioners) who all work together to provide you with the care you need, when you need it.  We recommend signing up for the patient portal called "MyChart".  Sign up information is provided on this After Visit Summary.  MyChart is used to connect with patients for Virtual Visits (Telemedicine).  Patients are able to view lab/test results, encounter notes, upcoming appointments, etc.  Non-urgent messages can be sent to your provider as well.   To learn more about what you can do with MyChart, go to [email or web address].    Your next appointment:   As needed  The format for your next appointment:   In Person  Provider:   Quay Burow, MD

## 2019-07-10 NOTE — Assessment & Plan Note (Signed)
History of essential hypertension with blood pressure measured today at 136/60.  The patient is on carvedilol.

## 2019-07-10 NOTE — Telephone Encounter (Signed)
Enrolled patient for a 14 day Zio monitor to be mailed to patients home.  

## 2019-07-10 NOTE — Assessment & Plan Note (Signed)
Atypical chest pain in the past with 2 - Myoview's, 1 in 2014 and 18/15/19 without recurrent symptoms.

## 2019-07-10 NOTE — Assessment & Plan Note (Signed)
Summer [name] has complained of dizziness over the last several months..  It occurs on a daily basis.  Has some orthostatic components but other times that do not occur with change in position.  She has been seen and told that she is been dehydrated.  Talked about increasing fluid intake.  I am to get a 2-week Zio patch to further evaluate and arrhythmogenic cause.

## 2019-07-10 NOTE — Progress Notes (Signed)
07/10/2019 Jonna Herrle   02-18-1956  [id number]  Primary Physician Harlan Stains, MD Primary Cardiologist: Lorretta Harp MD Lupe Carney, [place]  HPI:  Summer [name] is a 64 y.o.  moderately overweight divorced African-American female mother of 1 child who currently does not work.  She was referred by Serina Cowper PA-C for evaluation of left arm pain.  I last saw her in the office 09/24/2017.  She is accompanied by her daughter Phineas Real. Risk factors include treated hypertension and hyperlipidemia.  She is never smoked.  Mother did have a myocardial infarction age 57.  She has GERD as well.  She was complaining of daily substernal chest pain lasting up to 5 minutes at a time.  I obtained a Myoview stress test 10/03/2017 which was normal.  She really has had no recurrent symptoms.  Since I saw her 2 years ago her new symptoms are of dizziness over the last several months and occur on a daily basis.  There is an orthostatic component but it does not necessarily always occur when changing positions.  She has been told that she is been dehydrated..   Current Meds  Medication Sig  . acetaminophen (TYLENOL) 500 MG tablet Take 500 mg by mouth every 6 (six) hours as needed for moderate pain or fever.  Marland Kitchen aspirin 81 MG tablet Take 81 mg by mouth daily.  Marland Kitchen atorvastatin (LIPITOR) 40 MG tablet Take 40 mg by mouth daily.   . calcium gluconate 500 MG tablet Take 1 tablet by mouth daily.  . carbamazepine (EPITOL) 200 MG tablet 2 tablets in the morning and 1 in the evening  . carvedilol (COREG) 3.125 MG tablet Take 3.125 mg by mouth 2 (two) times daily.  . cetirizine (ZYRTEC) 10 MG tablet   . Cholecalciferol (VITAMIN D3) 1000 units CAPS Take 1,000 Units by mouth daily.  . DULoxetine (CYMBALTA) 60 MG capsule Take 1 capsule (60 mg total) by mouth 2 (two) times daily.  . fluticasone (FLONASE) 50 MCG/ACT nasal spray   . ipratropium (ATROVENT) 0.03 % nasal spray Place 2 sprays into both nostrils  2 (two) times daily as needed (allergies).   Marland Kitchen levETIRAcetam (KEPPRA) 250 MG tablet Take 1 tablet (250 mg total) by mouth 2 (two) times daily.  Marland Kitchen LORazepam (ATIVAN) 0.5 MG tablet Take 0.5 mg by mouth 3 (three) times daily as needed for anxiety.  . meloxicam (MOBIC) 15 MG tablet Take 15 mg by mouth daily as needed for pain.   . pantoprazole (PROTONIX) 40 MG tablet Take 40 mg by mouth 2 (two) times daily as needed (indigestion).   Marland Kitchen PRESCRIPTION MEDICATION Inject 1 Dose as directed every 6 (six) months. Potassium injection     Allergies  Allergen Reactions  . Gabapentin     Intolerance, anxiety  . Tape Other (See Comments)    Plastic tape only.  REACTION:  Skin redness.  . Chlorhexidine Itching    REACTION:  Skin redness    Social History   Socioeconomic History  . Marital status: Divorced    Spouse name: Not on file  . Number of children: 1  . Years of education: 61  . Highest education level: Not on file  Occupational History  . Occupation: disabled  Tobacco Use  . Smoking status: Never Smoker  . Smokeless tobacco: Never Used  Substance and Sexual Activity  . Alcohol use: No  . Drug use: No  . Sexual activity: Not on file  Other Topics Concern  .  Not on file  Social History Narrative   Divorced mother of one. Disabled due to blindness.   Does not drink. Does not smoke/has never smoked.   Right handed.   Caffeine: 2 coke cola daily.   Social Determinants of Health   Financial Resource Strain:   . Difficulty of Paying Living Expenses:   Food Insecurity:   . Worried About Charity fundraiser in the Last Year:   . Arboriculturist in the Last Year:   Transportation Needs:   . Film/video editor (Medical):   Marland Kitchen Lack of Transportation (Non-Medical):   Physical Activity:   . Days of Exercise per Week:   . Minutes of Exercise per Session:   Stress:   . Feeling of Stress :   Social Connections:   . Frequency of Communication with Friends and Family:   . Frequency  of Social Gatherings with Friends and Family:   . Attends Religious Services:   . Active Member of Clubs or Organizations:   . Attends Archivist Meetings:   Marland Kitchen Marital Status:   Intimate Partner Violence:   . Fear of Current or Ex-Partner:   . Emotionally Abused:   Marland Kitchen Physically Abused:   . Sexually Abused:      Review of Systems: General: negative for chills, fever, night sweats or weight changes.  Cardiovascular: negative for chest pain, dyspnea on exertion, edema, orthopnea, palpitations, paroxysmal nocturnal dyspnea or shortness of breath Dermatological: negative for rash Respiratory: negative for cough or wheezing Urologic: negative for hematuria Abdominal: negative for nausea, vomiting, diarrhea, bright red blood per rectum, melena, or hematemesis Neurologic: negative for visual changes, syncope, or dizziness All other systems reviewed and are otherwise negative except as noted above.    Blood pressure 136/60, pulse 85, height 5\' 5"  (1.651 m), weight 229 lb 12.8 oz (104.2 kg), SpO2 99 %.  General appearance: alert and no distress Neck: no adenopathy, no carotid bruit, no JVD, supple, symmetrical, trachea midline and thyroid not enlarged, symmetric, no tenderness/mass/nodules Lungs: clear to auscultation bilaterally Heart: regular rate and rhythm, S1, S2 normal, no murmur, click, rub or gallop Extremities: extremities normal, atraumatic, no cyanosis or edema Pulses: 2+ and symmetric Skin: Skin color, texture, turgor normal. No rashes or lesions Neurologic: Alert and oriented X 3, normal strength and tone. Normal symmetric reflexes. Normal coordination and gait  EKG not performed today.   ASSESSMENT AND PLAN:   Hypertension History of essential hypertension with blood pressure measured today at 136/60.  The patient is on carvedilol.  Hyperlipidemia History of hyperlipidemia on statin therapy with lipid profile performed 03/23/2019 revealing a total cholesterol of  192, and HDL of 68.  Atypical chest pain Atypical chest pain in the past with 2 - Myoview's, 1 in 2014 and 18/15/19 without recurrent symptoms.  Dizziness Ms. Lattin has complained of dizziness over the last several months..  It occurs on a daily basis.  Has some orthostatic components but other times that do not occur with change in position.  She has been seen and told that she is been dehydrated.  Talked about increasing fluid intake.  I am to get a 2-week Zio patch to further evaluate and arrhythmogenic cause.      Lorretta Harp MD FACP,FACC,FAHA, [facility] 07/10/2019 9:10 AM

## 2019-07-13 DIAGNOSIS — I10 Essential (primary) hypertension: Secondary | ICD-10-CM | POA: Diagnosis not present

## 2019-07-13 DIAGNOSIS — F339 Major depressive disorder, recurrent, unspecified: Secondary | ICD-10-CM | POA: Diagnosis not present

## 2019-07-13 DIAGNOSIS — M810 Age-related osteoporosis without current pathological fracture: Secondary | ICD-10-CM | POA: Diagnosis not present

## 2019-07-13 DIAGNOSIS — E785 Hyperlipidemia, unspecified: Secondary | ICD-10-CM | POA: Diagnosis not present

## 2019-07-14 ENCOUNTER — Other Ambulatory Visit (INDEPENDENT_AMBULATORY_CARE_PROVIDER_SITE_OTHER): Payer: Medicare HMO

## 2019-07-14 DIAGNOSIS — F419 Anxiety disorder, unspecified: Secondary | ICD-10-CM | POA: Diagnosis not present

## 2019-07-14 DIAGNOSIS — H548 Legal blindness, as defined in USA: Secondary | ICD-10-CM | POA: Diagnosis not present

## 2019-07-14 DIAGNOSIS — N183 Chronic kidney disease, stage 3 unspecified: Secondary | ICD-10-CM | POA: Diagnosis not present

## 2019-07-14 DIAGNOSIS — F339 Major depressive disorder, recurrent, unspecified: Secondary | ICD-10-CM | POA: Diagnosis not present

## 2019-07-14 DIAGNOSIS — R1310 Dysphagia, unspecified: Secondary | ICD-10-CM | POA: Diagnosis not present

## 2019-07-14 DIAGNOSIS — R42 Dizziness and giddiness: Secondary | ICD-10-CM

## 2019-07-14 DIAGNOSIS — I129 Hypertensive chronic kidney disease with stage 1 through stage 4 chronic kidney disease, or unspecified chronic kidney disease: Secondary | ICD-10-CM | POA: Diagnosis not present

## 2019-07-14 DIAGNOSIS — R7303 Prediabetes: Secondary | ICD-10-CM | POA: Diagnosis not present

## 2019-07-14 DIAGNOSIS — N3281 Overactive bladder: Secondary | ICD-10-CM | POA: Diagnosis not present

## 2019-07-14 DIAGNOSIS — M810 Age-related osteoporosis without current pathological fracture: Secondary | ICD-10-CM | POA: Diagnosis not present

## 2019-07-16 ENCOUNTER — Encounter: Payer: Self-pay | Admitting: *Deleted

## 2019-07-17 ENCOUNTER — Ambulatory Visit: Payer: Medicare HMO | Admitting: Neurology

## 2019-07-17 ENCOUNTER — Other Ambulatory Visit: Payer: Self-pay

## 2019-07-17 ENCOUNTER — Encounter: Payer: Self-pay | Admitting: Neurology

## 2019-07-17 VITALS — BP 136/61 | HR 78 | Ht 65.0 in | Wt 229.0 lb

## 2019-07-17 DIAGNOSIS — G501 Atypical facial pain: Secondary | ICD-10-CM

## 2019-07-17 DIAGNOSIS — H8110 Benign paroxysmal vertigo, unspecified ear: Secondary | ICD-10-CM

## 2019-07-17 HISTORY — DX: Benign paroxysmal vertigo, unspecified ear: H81.10
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
## 2019-07-22 DIAGNOSIS — F419 Anxiety disorder, unspecified: Secondary | ICD-10-CM | POA: Diagnosis not present

## 2019-07-22 DIAGNOSIS — I129 Hypertensive chronic kidney disease with stage 1 through stage 4 chronic kidney disease, or unspecified chronic kidney disease: Secondary | ICD-10-CM | POA: Diagnosis not present

## 2019-07-22 DIAGNOSIS — F339 Major depressive disorder, recurrent, unspecified: Secondary | ICD-10-CM | POA: Diagnosis not present

## 2019-07-22 DIAGNOSIS — H548 Legal blindness, as defined in USA: Secondary | ICD-10-CM | POA: Diagnosis not present

## 2019-07-22 DIAGNOSIS — R1310 Dysphagia, unspecified: Secondary | ICD-10-CM | POA: Diagnosis not present

## 2019-07-22 DIAGNOSIS — R7303 Prediabetes: Secondary | ICD-10-CM | POA: Diagnosis not present

## 2019-07-22 DIAGNOSIS — M810 Age-related osteoporosis without current pathological fracture: Secondary | ICD-10-CM | POA: Diagnosis not present

## 2019-07-22 DIAGNOSIS — N183 Chronic kidney disease, stage 3 unspecified: Secondary | ICD-10-CM | POA: Diagnosis not present

## 2019-07-22 DIAGNOSIS — N3281 Overactive bladder: Secondary | ICD-10-CM | POA: Diagnosis not present

## 2019-07-24 DIAGNOSIS — H548 Legal blindness, as defined in USA: Secondary | ICD-10-CM | POA: Diagnosis not present

## 2019-07-24 DIAGNOSIS — N3281 Overactive bladder: Secondary | ICD-10-CM | POA: Diagnosis not present

## 2019-07-24 DIAGNOSIS — M810 Age-related osteoporosis without current pathological fracture: Secondary | ICD-10-CM | POA: Diagnosis not present

## 2019-07-24 DIAGNOSIS — F419 Anxiety disorder, unspecified: Secondary | ICD-10-CM | POA: Diagnosis not present

## 2019-07-24 DIAGNOSIS — R7303 Prediabetes: Secondary | ICD-10-CM | POA: Diagnosis not present

## 2019-07-24 DIAGNOSIS — R1310 Dysphagia, unspecified: Secondary | ICD-10-CM | POA: Diagnosis not present

## 2019-07-24 DIAGNOSIS — N183 Chronic kidney disease, stage 3 unspecified: Secondary | ICD-10-CM | POA: Diagnosis not present

## 2019-07-24 DIAGNOSIS — I129 Hypertensive chronic kidney disease with stage 1 through stage 4 chronic kidney disease, or unspecified chronic kidney disease: Secondary | ICD-10-CM | POA: Diagnosis not present

## 2019-07-24 DIAGNOSIS — F339 Major depressive disorder, recurrent, unspecified: Secondary | ICD-10-CM | POA: Diagnosis not present

## 2019-07-30 DIAGNOSIS — F339 Major depressive disorder, recurrent, unspecified: Secondary | ICD-10-CM | POA: Diagnosis not present

## 2019-07-30 DIAGNOSIS — I10 Essential (primary) hypertension: Secondary | ICD-10-CM | POA: Diagnosis not present

## 2019-07-30 DIAGNOSIS — E785 Hyperlipidemia, unspecified: Secondary | ICD-10-CM | POA: Diagnosis not present

## 2019-07-30 DIAGNOSIS — M810 Age-related osteoporosis without current pathological fracture: Secondary | ICD-10-CM | POA: Diagnosis not present

## 2019-08-03 DIAGNOSIS — Z1231 Encounter for screening mammogram for malignant neoplasm of breast: Secondary | ICD-10-CM | POA: Diagnosis not present

## 2019-08-14 DIAGNOSIS — R42 Dizziness and giddiness: Secondary | ICD-10-CM | POA: Diagnosis not present

## 2019-08-19 DIAGNOSIS — N6489 Other specified disorders of breast: Secondary | ICD-10-CM | POA: Diagnosis not present

## 2019-08-19 DIAGNOSIS — R928 Other abnormal and inconclusive findings on diagnostic imaging of breast: Secondary | ICD-10-CM | POA: Diagnosis not present

## 2019-09-10 DIAGNOSIS — I10 Essential (primary) hypertension: Secondary | ICD-10-CM | POA: Diagnosis not present

## 2019-09-10 DIAGNOSIS — E785 Hyperlipidemia, unspecified: Secondary | ICD-10-CM | POA: Diagnosis not present

## 2019-09-10 DIAGNOSIS — F339 Major depressive disorder, recurrent, unspecified: Secondary | ICD-10-CM | POA: Diagnosis not present

## 2019-09-10 DIAGNOSIS — M810 Age-related osteoporosis without current pathological fracture: Secondary | ICD-10-CM | POA: Diagnosis not present

## 2019-09-16 DIAGNOSIS — R928 Other abnormal and inconclusive findings on diagnostic imaging of breast: Secondary | ICD-10-CM | POA: Diagnosis not present

## 2019-09-16 DIAGNOSIS — R921 Mammographic calcification found on diagnostic imaging of breast: Secondary | ICD-10-CM | POA: Diagnosis not present

## 2019-10-07 ENCOUNTER — Telehealth: Payer: Self-pay | Admitting: Neurology

## 2019-10-07 NOTE — Telephone Encounter (Signed)
Paperwork faxed to [facility] 10/06/2019.

## 2019-10-16 DIAGNOSIS — R7303 Prediabetes: Secondary | ICD-10-CM | POA: Diagnosis not present

## 2019-10-16 DIAGNOSIS — M810 Age-related osteoporosis without current pathological fracture: Secondary | ICD-10-CM | POA: Diagnosis not present

## 2019-10-16 DIAGNOSIS — I10 Essential (primary) hypertension: Secondary | ICD-10-CM | POA: Diagnosis not present

## 2019-10-16 DIAGNOSIS — F339 Major depressive disorder, recurrent, unspecified: Secondary | ICD-10-CM | POA: Diagnosis not present

## 2019-10-16 DIAGNOSIS — F419 Anxiety disorder, unspecified: Secondary | ICD-10-CM | POA: Diagnosis not present

## 2019-10-16 DIAGNOSIS — R42 Dizziness and giddiness: Secondary | ICD-10-CM | POA: Diagnosis not present

## 2019-10-16 DIAGNOSIS — E785 Hyperlipidemia, unspecified: Secondary | ICD-10-CM | POA: Diagnosis not present

## 2019-10-20 DIAGNOSIS — F339 Major depressive disorder, recurrent, unspecified: Secondary | ICD-10-CM | POA: Diagnosis not present

## 2019-10-20 DIAGNOSIS — N183 Chronic kidney disease, stage 3 unspecified: Secondary | ICD-10-CM | POA: Diagnosis not present

## 2019-10-20 DIAGNOSIS — M810 Age-related osteoporosis without current pathological fracture: Secondary | ICD-10-CM | POA: Diagnosis not present

## 2019-10-20 DIAGNOSIS — I129 Hypertensive chronic kidney disease with stage 1 through stage 4 chronic kidney disease, or unspecified chronic kidney disease: Secondary | ICD-10-CM | POA: Diagnosis not present

## 2019-10-20 DIAGNOSIS — K219 Gastro-esophageal reflux disease without esophagitis: Secondary | ICD-10-CM | POA: Diagnosis not present

## 2019-10-20 DIAGNOSIS — E785 Hyperlipidemia, unspecified: Secondary | ICD-10-CM | POA: Diagnosis not present

## 2019-11-19 DIAGNOSIS — M810 Age-related osteoporosis without current pathological fracture: Secondary | ICD-10-CM | POA: Diagnosis not present

## 2019-11-19 DIAGNOSIS — N183 Chronic kidney disease, stage 3 unspecified: Secondary | ICD-10-CM | POA: Diagnosis not present

## 2019-11-19 DIAGNOSIS — K219 Gastro-esophageal reflux disease without esophagitis: Secondary | ICD-10-CM | POA: Diagnosis not present

## 2019-11-19 DIAGNOSIS — I129 Hypertensive chronic kidney disease with stage 1 through stage 4 chronic kidney disease, or unspecified chronic kidney disease: Secondary | ICD-10-CM | POA: Diagnosis not present

## 2019-11-19 DIAGNOSIS — F339 Major depressive disorder, recurrent, unspecified: Secondary | ICD-10-CM | POA: Diagnosis not present

## 2019-11-19 DIAGNOSIS — E785 Hyperlipidemia, unspecified: Secondary | ICD-10-CM | POA: Diagnosis not present

## 2019-12-12 ENCOUNTER — Other Ambulatory Visit: Payer: Self-pay | Admitting: Neurology

## 2019-12-18 DIAGNOSIS — M810 Age-related osteoporosis without current pathological fracture: Secondary | ICD-10-CM | POA: Diagnosis not present

## 2019-12-18 DIAGNOSIS — K219 Gastro-esophageal reflux disease without esophagitis: Secondary | ICD-10-CM | POA: Diagnosis not present

## 2019-12-18 DIAGNOSIS — N183 Chronic kidney disease, stage 3 unspecified: Secondary | ICD-10-CM | POA: Diagnosis not present

## 2019-12-18 DIAGNOSIS — F339 Major depressive disorder, recurrent, unspecified: Secondary | ICD-10-CM | POA: Diagnosis not present

## 2019-12-18 DIAGNOSIS — I129 Hypertensive chronic kidney disease with stage 1 through stage 4 chronic kidney disease, or unspecified chronic kidney disease: Secondary | ICD-10-CM | POA: Diagnosis not present

## 2019-12-18 DIAGNOSIS — E785 Hyperlipidemia, unspecified: Secondary | ICD-10-CM | POA: Diagnosis not present

## 2019-12-23 ENCOUNTER — Ambulatory Visit: Payer: Medicare HMO | Admitting: Neurology

## 2020-01-13 DIAGNOSIS — Z23 Encounter for immunization: Secondary | ICD-10-CM | POA: Diagnosis not present

## 2020-01-13 DIAGNOSIS — F339 Major depressive disorder, recurrent, unspecified: Secondary | ICD-10-CM | POA: Diagnosis not present

## 2020-01-13 DIAGNOSIS — I129 Hypertensive chronic kidney disease with stage 1 through stage 4 chronic kidney disease, or unspecified chronic kidney disease: Secondary | ICD-10-CM | POA: Diagnosis not present

## 2020-01-13 DIAGNOSIS — F419 Anxiety disorder, unspecified: Secondary | ICD-10-CM | POA: Diagnosis not present

## 2020-01-13 DIAGNOSIS — J3089 Other allergic rhinitis: Secondary | ICD-10-CM | POA: Diagnosis not present

## 2020-01-13 DIAGNOSIS — M79674 Pain in right toe(s): Secondary | ICD-10-CM | POA: Diagnosis not present

## 2020-01-13 DIAGNOSIS — N183 Chronic kidney disease, stage 3 unspecified: Secondary | ICD-10-CM | POA: Diagnosis not present

## 2020-01-13 DIAGNOSIS — Z6841 Body Mass Index (BMI) 40.0 and over, adult: Secondary | ICD-10-CM | POA: Diagnosis not present

## 2020-01-13 DIAGNOSIS — R7303 Prediabetes: Secondary | ICD-10-CM | POA: Diagnosis not present

## 2020-01-19 DIAGNOSIS — E785 Hyperlipidemia, unspecified: Secondary | ICD-10-CM | POA: Diagnosis not present

## 2020-01-19 DIAGNOSIS — F339 Major depressive disorder, recurrent, unspecified: Secondary | ICD-10-CM | POA: Diagnosis not present

## 2020-01-19 DIAGNOSIS — K219 Gastro-esophageal reflux disease without esophagitis: Secondary | ICD-10-CM | POA: Diagnosis not present

## 2020-01-19 DIAGNOSIS — N183 Chronic kidney disease, stage 3 unspecified: Secondary | ICD-10-CM | POA: Diagnosis not present

## 2020-01-19 DIAGNOSIS — M810 Age-related osteoporosis without current pathological fracture: Secondary | ICD-10-CM | POA: Diagnosis not present

## 2020-01-19 DIAGNOSIS — I129 Hypertensive chronic kidney disease with stage 1 through stage 4 chronic kidney disease, or unspecified chronic kidney disease: Secondary | ICD-10-CM | POA: Diagnosis not present

## 2020-02-05 DIAGNOSIS — Z961 Presence of intraocular lens: Secondary | ICD-10-CM | POA: Diagnosis not present

## 2020-02-05 DIAGNOSIS — H15002 Unspecified scleritis, left eye: Secondary | ICD-10-CM | POA: Diagnosis not present

## 2020-02-05 DIAGNOSIS — H15092 Other scleritis, left eye: Secondary | ICD-10-CM | POA: Diagnosis not present

## 2020-02-05 DIAGNOSIS — H3342 Traction detachment of retina, left eye: Secondary | ICD-10-CM | POA: Diagnosis not present

## 2020-02-16 ENCOUNTER — Ambulatory Visit: Payer: Medicare HMO | Admitting: Neurology

## 2020-02-16 ENCOUNTER — Encounter: Payer: Self-pay | Admitting: Neurology

## 2020-02-16 ENCOUNTER — Other Ambulatory Visit: Payer: Self-pay

## 2020-02-16 VITALS — BP 138/69 | HR 71 | Ht 65.0 in | Wt 249.0 lb

## 2020-02-16 DIAGNOSIS — G501 Atypical facial pain: Secondary | ICD-10-CM | POA: Diagnosis not present

## 2020-02-16 MED ORDER — LEVETIRACETAM 500 MG PO TABS: 500.0000 mg | ORAL_TABLET | Freq: Two times a day (BID) | ORAL | 1 refills | Status: DC
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
## 2020-02-16 NOTE — Patient Instructions (Addendum)
Increase Keppra to 250 mg in the morning 500 mg in the evening for 1 week Then 500 mg twice daily Keep carbamazepine at current dosing See you back in 6 months

## 2020-02-17 ENCOUNTER — Telehealth: Payer: Self-pay

## 2020-02-17 LAB — COMPREHENSIVE METABOLIC PANEL
ALT: 37 IU/L — ABNORMAL HIGH (ref 0–32)
AST: 44 IU/L — ABNORMAL HIGH (ref 0–40)
Albumin/Globulin Ratio: 1.2 (ref 1.2–2.2)
Albumin: 3.7 g/dL — ABNORMAL LOW (ref 3.8–4.8)
Alkaline Phosphatase: 131 IU/L — ABNORMAL HIGH (ref 44–121)
BUN/Creatinine Ratio: 13 (ref 12–28)
BUN: 12 mg/dL (ref 8–27)
Bilirubin Total: <0.2 mg/dL (ref 0.0–1.2)
CO2: 22 mmol/L (ref 20–29)
Calcium: 9 mg/dL (ref 8.7–10.3)
Chloride: 105 mmol/L (ref 96–106)
Creatinine, Ser: 0.93 mg/dL (ref 0.57–1.00)
GFR calc Af Amer: 76 mL/min/{1.73_m2} (ref >59)
GFR calc non Af Amer: 66 mL/min/{1.73_m2} (ref >59)
Globulin, Total: 3.2 g/dL (ref 1.5–4.5)
Glucose: 94 mg/dL (ref 65–99)
Potassium: 4.2 mmol/L (ref 3.5–5.2)
Sodium: 140 mmol/L (ref 134–144)
Total Protein: 6.9 g/dL (ref 6.0–8.5)

## 2020-02-17 LAB — CARBAMAZEPINE LEVEL, TOTAL: Carbamazepine (Tegretol), S: 9.1 ug/mL (ref 4.0–12.0)

## 2020-02-17 NOTE — Telephone Encounter (Signed)
-----   Message from Sarah J Slack, NP sent at 02/17/2020  7:45 AM EST ----- Carbamazepine level is within normal range. Labs do show elevated mildly liver enzymes, probably from frequent Tylenol she has been taking, we talked about need to cut this back. Yesterday, we increased Keppra to help with her pain control, hope this will help :) 

## 2020-02-17 NOTE — Progress Notes (Signed)
I have read the note, and I agree with the clinical assessment and plan.  [employer] K Tien Aispuro   

## 2020-02-17 NOTE — Telephone Encounter (Signed)
Attempted to call pt, LVM for normal results per DPR. Ask pt to call back for questions or concerns.  

## 2020-02-22 ENCOUNTER — Ambulatory Visit: Payer: Medicare HMO | Admitting: Podiatry

## 2020-02-29 ENCOUNTER — Telehealth: Payer: Self-pay

## 2020-02-29 NOTE — Telephone Encounter (Signed)
Pt verified by name and DOB, results given per provider, pt voiced understanding all question answered. 

## 2020-02-29 NOTE — Telephone Encounter (Signed)
-----   Message from Suzzanne Cloud, NP sent at 02/17/2020  7:45 AM EST ----- Carbamazepine level is within normal range. Labs do show elevated mildly liver enzymes, probably from frequent Tylenol she has been taking, we talked about need to cut this back. Yesterday, we increased Keppra to help with her pain control, hope this will help :)

## 2020-03-02 ENCOUNTER — Ambulatory Visit: Payer: Medicare HMO | Admitting: Podiatry

## 2020-03-02 ENCOUNTER — Other Ambulatory Visit: Payer: Self-pay

## 2020-03-02 ENCOUNTER — Ambulatory Visit (INDEPENDENT_AMBULATORY_CARE_PROVIDER_SITE_OTHER): Payer: Medicare HMO

## 2020-03-02 DIAGNOSIS — D169 Benign neoplasm of bone and articular cartilage, unspecified: Secondary | ICD-10-CM | POA: Diagnosis not present

## 2020-03-02 DIAGNOSIS — M779 Enthesopathy, unspecified: Secondary | ICD-10-CM

## 2020-03-02 DIAGNOSIS — M79672 Pain in left foot: Secondary | ICD-10-CM

## 2020-03-02 DIAGNOSIS — M79671 Pain in right foot: Secondary | ICD-10-CM | POA: Diagnosis not present

## 2020-03-02 MED ORDER — TRIAMCINOLONE ACETONIDE 10 MG/ML IJ SUSP
10.0000 mg | Freq: Once | INTRAMUSCULAR | Status: AC
  Administered 2020-03-02: 10 mg
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
## 2020-03-13 ENCOUNTER — Other Ambulatory Visit: Payer: Self-pay | Admitting: Neurology

## 2020-03-16 ENCOUNTER — Telehealth: Payer: Self-pay | Admitting: Cardiovascular Disease

## 2020-03-16 DIAGNOSIS — R079 Chest pain, unspecified: Secondary | ICD-10-CM | POA: Diagnosis not present

## 2020-03-16 DIAGNOSIS — R0789 Other chest pain: Secondary | ICD-10-CM | POA: Diagnosis not present

## 2020-03-16 NOTE — Telephone Encounter (Signed)
Pt c/o of Chest Pain: 1. Are you having CP right now? Not sure  2. Are you experiencing any other symptoms (ex. SOB, nausea, vomiting, sweating)? No 3. How long have you been experiencing CP? A few times in last few weeks  4. Is your CP continuous or coming and going? Come and go. 5. Have you taken Nitroglycerin? No    Patient had to call EMS last night because she was having chest pain. They gave a EKG and said it was good, patient BP last night 140/75 pulse 63. EM told her to take 4 Asprin chew them Patient would like to speak with a nurse concering this matter. Please advise

## 2020-03-16 NOTE — Telephone Encounter (Signed)
Called the patient. lmtcb x2.

## 2020-03-18 NOTE — Telephone Encounter (Signed)
Follow Up:   Pt is returning Heather's call from today.

## 2020-03-18 NOTE — Telephone Encounter (Signed)
Spoke with patient. She had chest pain and left arm pain 2 days ago. It woke her up but was not accompanied by any other symptoms. She called EMS who did an EKG and told her it was okay. She called PCP office and PCP advised patient to resume heartburn medications and made an appointment for Monday. She reports she has not had any other episodes and that she's better now. Patient will be seen by PCP Monday, no further questions at this time.

## 2020-03-18 NOTE — Telephone Encounter (Signed)
Attempted to call the patient to follow up. No answer- I left a message to please call back.

## 2020-03-21 DIAGNOSIS — R921 Mammographic calcification found on diagnostic imaging of breast: Secondary | ICD-10-CM | POA: Diagnosis not present

## 2020-03-21 DIAGNOSIS — R079 Chest pain, unspecified: Secondary | ICD-10-CM | POA: Diagnosis not present

## 2020-03-21 DIAGNOSIS — K219 Gastro-esophageal reflux disease without esophagitis: Secondary | ICD-10-CM | POA: Diagnosis not present

## 2020-03-21 DIAGNOSIS — F419 Anxiety disorder, unspecified: Secondary | ICD-10-CM | POA: Diagnosis not present

## 2020-03-30 DIAGNOSIS — R6884 Jaw pain: Secondary | ICD-10-CM | POA: Diagnosis not present

## 2020-03-31 ENCOUNTER — Other Ambulatory Visit: Payer: Self-pay | Admitting: Family Medicine

## 2020-03-31 ENCOUNTER — Ambulatory Visit
Admission: RE | Admit: 2020-03-31 | Discharge: 2020-03-31 | Disposition: A | Discharge status: Home / Self Care | Payer: Medicare HMO | Source: Ambulatory Visit | Attending: Family Medicine | Admitting: Family Medicine

## 2020-03-31 ENCOUNTER — Other Ambulatory Visit: Payer: Self-pay

## 2020-03-31 DIAGNOSIS — R6884 Jaw pain: Secondary | ICD-10-CM

## 2020-03-31 DIAGNOSIS — R220 Localized swelling, mass and lump, head: Secondary | ICD-10-CM | POA: Diagnosis not present

## 2020-04-06 ENCOUNTER — Other Ambulatory Visit: Payer: Self-pay | Admitting: Radiology

## 2020-04-06 DIAGNOSIS — R921 Mammographic calcification found on diagnostic imaging of breast: Secondary | ICD-10-CM | POA: Diagnosis not present

## 2020-04-06 DIAGNOSIS — N6032 Fibrosclerosis of left breast: Secondary | ICD-10-CM | POA: Diagnosis not present

## 2020-04-18 DIAGNOSIS — M810 Age-related osteoporosis without current pathological fracture: Secondary | ICD-10-CM | POA: Diagnosis not present

## 2020-04-27 ENCOUNTER — Other Ambulatory Visit: Payer: Self-pay | Admitting: Cardiology

## 2020-04-27 DIAGNOSIS — E559 Vitamin D deficiency, unspecified: Secondary | ICD-10-CM | POA: Diagnosis not present

## 2020-04-27 DIAGNOSIS — I10 Essential (primary) hypertension: Secondary | ICD-10-CM | POA: Diagnosis not present

## 2020-04-27 DIAGNOSIS — E785 Hyperlipidemia, unspecified: Secondary | ICD-10-CM | POA: Diagnosis not present

## 2020-04-27 DIAGNOSIS — R0789 Other chest pain: Secondary | ICD-10-CM | POA: Diagnosis not present

## 2020-04-27 DIAGNOSIS — K222 Esophageal obstruction: Secondary | ICD-10-CM | POA: Diagnosis not present

## 2020-04-27 DIAGNOSIS — R079 Chest pain, unspecified: Secondary | ICD-10-CM

## 2020-04-28 DIAGNOSIS — E785 Hyperlipidemia, unspecified: Secondary | ICD-10-CM | POA: Diagnosis not present

## 2020-04-28 DIAGNOSIS — R7303 Prediabetes: Secondary | ICD-10-CM | POA: Diagnosis not present

## 2020-04-28 DIAGNOSIS — I10 Essential (primary) hypertension: Secondary | ICD-10-CM | POA: Diagnosis not present

## 2020-04-28 DIAGNOSIS — E039 Hypothyroidism, unspecified: Secondary | ICD-10-CM | POA: Diagnosis not present

## 2020-05-06 ENCOUNTER — Other Ambulatory Visit: Payer: Self-pay

## 2020-05-06 ENCOUNTER — Encounter (HOSPITAL_COMMUNITY)
Admission: RE | Admit: 2020-05-06 | Discharge: 2020-05-06 | Disposition: A | Discharge status: Home / Self Care | Payer: Medicare HMO | Source: Ambulatory Visit | Attending: Cardiology | Admitting: Cardiology

## 2020-05-06 DIAGNOSIS — R079 Chest pain, unspecified: Secondary | ICD-10-CM | POA: Insufficient documentation

## 2020-05-09 ENCOUNTER — Other Ambulatory Visit: Payer: Self-pay

## 2020-05-09 ENCOUNTER — Encounter (HOSPITAL_COMMUNITY)
Admission: RE | Admit: 2020-05-09 | Discharge: 2020-05-09 | Disposition: A | Discharge status: Home / Self Care | Payer: Medicare HMO | Source: Ambulatory Visit | Attending: Cardiology | Admitting: Cardiology

## 2020-05-09 DIAGNOSIS — I10 Essential (primary) hypertension: Secondary | ICD-10-CM | POA: Diagnosis not present

## 2020-05-09 DIAGNOSIS — K222 Esophageal obstruction: Secondary | ICD-10-CM | POA: Diagnosis not present

## 2020-05-09 DIAGNOSIS — Z5181 Encounter for therapeutic drug level monitoring: Secondary | ICD-10-CM | POA: Insufficient documentation

## 2020-05-09 DIAGNOSIS — R0789 Other chest pain: Secondary | ICD-10-CM | POA: Diagnosis not present

## 2020-05-09 DIAGNOSIS — E559 Vitamin D deficiency, unspecified: Secondary | ICD-10-CM | POA: Diagnosis not present

## 2020-05-09 DIAGNOSIS — R079 Chest pain, unspecified: Secondary | ICD-10-CM | POA: Insufficient documentation

## 2020-05-09 DIAGNOSIS — E785 Hyperlipidemia, unspecified: Secondary | ICD-10-CM | POA: Diagnosis not present

## 2020-05-09 MED ORDER — TECHNETIUM TC 99M TETROFOSMIN IV KIT
31.3000 | PACK | Freq: Once | INTRAVENOUS | Status: AC | PRN
  Administered 2020-05-09: 31.3 via INTRAVENOUS

## 2020-05-09 MED ORDER — TECHNETIUM TC 99M TETROFOSMIN IV KIT
11.0000 | PACK | Freq: Once | INTRAVENOUS | Status: AC | PRN
  Administered 2020-05-09: 11 via INTRAVENOUS

## 2020-05-09 MED ORDER — REGADENOSON 0.4 MG/5ML IV SOLN: 0.4000 mg | Freq: Once | INTRAVENOUS | Status: AC

## 2020-05-09 MED ORDER — REGADENOSON 0.4 MG/5ML IV SOLN
INTRAVENOUS | Status: AC
  Administered 2020-05-09: 0.4 mg via INTRAVENOUS
  Filled 2020-05-09: qty 5

## 2020-06-15 ENCOUNTER — Other Ambulatory Visit: Payer: Self-pay | Admitting: Neurology

## 2020-07-15 DIAGNOSIS — N183 Chronic kidney disease, stage 3 unspecified: Secondary | ICD-10-CM | POA: Diagnosis not present

## 2020-07-15 DIAGNOSIS — E785 Hyperlipidemia, unspecified: Secondary | ICD-10-CM | POA: Diagnosis not present

## 2020-07-15 DIAGNOSIS — R7303 Prediabetes: Secondary | ICD-10-CM | POA: Diagnosis not present

## 2020-07-15 DIAGNOSIS — M810 Age-related osteoporosis without current pathological fracture: Secondary | ICD-10-CM | POA: Diagnosis not present

## 2020-07-15 DIAGNOSIS — I129 Hypertensive chronic kidney disease with stage 1 through stage 4 chronic kidney disease, or unspecified chronic kidney disease: Secondary | ICD-10-CM | POA: Diagnosis not present

## 2020-07-27 DIAGNOSIS — I10 Essential (primary) hypertension: Secondary | ICD-10-CM | POA: Diagnosis not present

## 2020-07-27 DIAGNOSIS — E785 Hyperlipidemia, unspecified: Secondary | ICD-10-CM | POA: Diagnosis not present

## 2020-07-27 DIAGNOSIS — I129 Hypertensive chronic kidney disease with stage 1 through stage 4 chronic kidney disease, or unspecified chronic kidney disease: Secondary | ICD-10-CM | POA: Diagnosis not present

## 2020-07-27 DIAGNOSIS — E559 Vitamin D deficiency, unspecified: Secondary | ICD-10-CM | POA: Diagnosis not present

## 2020-07-27 DIAGNOSIS — I208 Other forms of angina pectoris: Secondary | ICD-10-CM | POA: Diagnosis not present

## 2020-07-27 DIAGNOSIS — K219 Gastro-esophageal reflux disease without esophagitis: Secondary | ICD-10-CM | POA: Diagnosis not present

## 2020-07-27 DIAGNOSIS — E1169 Type 2 diabetes mellitus with other specified complication: Secondary | ICD-10-CM | POA: Diagnosis not present

## 2020-07-27 DIAGNOSIS — H3320 Serous retinal detachment, unspecified eye: Secondary | ICD-10-CM | POA: Diagnosis not present

## 2020-07-27 DIAGNOSIS — N183 Chronic kidney disease, stage 3 unspecified: Secondary | ICD-10-CM | POA: Diagnosis not present

## 2020-07-27 DIAGNOSIS — M810 Age-related osteoporosis without current pathological fracture: Secondary | ICD-10-CM | POA: Diagnosis not present

## 2020-08-08 ENCOUNTER — Other Ambulatory Visit: Payer: Self-pay | Admitting: Neurology
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
## 2020-08-16 ENCOUNTER — Encounter: Payer: Self-pay | Admitting: Neurology

## 2020-08-16 ENCOUNTER — Ambulatory Visit: Payer: Medicare Other | Admitting: Neurology

## 2020-08-16 VITALS — BP 110/73 | HR 73 | Ht 65.0 in | Wt 244.0 lb

## 2020-08-16 DIAGNOSIS — G501 Atypical facial pain: Secondary | ICD-10-CM

## 2020-08-16 MED ORDER — LEVETIRACETAM 500 MG PO TABS: 500.0000 mg | ORAL_TABLET | Freq: Two times a day (BID) | ORAL | 3 refills | Status: DC

## 2020-08-16 NOTE — Progress Notes (Signed)
I have read the note, and I agree with the clinical assessment and plan.  Fleta Borgeson K Esai Stecklein   

## 2020-08-16 NOTE — Patient Instructions (Signed)
Check labs today  Continue current medications  See you back in 6 months

## 2020-08-17 ENCOUNTER — Telehealth: Payer: Self-pay

## 2020-08-17 LAB — CBC WITH DIFFERENTIAL/PLATELET
Basophils Absolute: 0.1 10*3/uL (ref 0.0–0.2)
Basos: 1 %
EOS (ABSOLUTE): 0 10*3/uL (ref 0.0–0.4)
Eos: 0 %
Hematocrit: 34.3 % (ref 34.0–46.6)
Hemoglobin: 11.3 g/dL (ref 11.1–15.9)
Immature Grans (Abs): 0 10*3/uL (ref 0.0–0.1)
Immature Granulocytes: 0 %
Lymphocytes Absolute: 1.5 10*3/uL (ref 0.7–3.1)
Lymphs: 25 %
MCH: 28.6 pg (ref 26.6–33.0)
MCHC: 32.9 g/dL (ref 31.5–35.7)
MCV: 87 fL (ref 79–97)
Monocytes Absolute: 0.5 10*3/uL (ref 0.1–0.9)
Monocytes: 9 %
Neutrophils Absolute: 3.9 10*3/uL (ref 1.4–7.0)
Neutrophils: 65 %
Platelets: 226 10*3/uL (ref 150–450)
RBC: 3.95 x10E6/uL (ref 3.77–5.28)
RDW: 12.7 % (ref 11.7–15.4)
WBC: 6 10*3/uL (ref 3.4–10.8)

## 2020-08-17 LAB — COMPREHENSIVE METABOLIC PANEL
ALT: 15 IU/L (ref 0–32)
AST: 21 IU/L (ref 0–40)
Albumin/Globulin Ratio: 1.1 — ABNORMAL LOW (ref 1.2–2.2)
Albumin: 3.8 g/dL (ref 3.8–4.8)
Alkaline Phosphatase: 132 IU/L — ABNORMAL HIGH (ref 44–121)
BUN/Creatinine Ratio: 15 (ref 12–28)
BUN: 21 mg/dL (ref 8–27)
Bilirubin Total: <0.2 mg/dL (ref 0.0–1.2)
CO2: 21 mmol/L (ref 20–29)
Calcium: 8.9 mg/dL (ref 8.7–10.3)
Chloride: 104 mmol/L (ref 96–106)
Creatinine, Ser: 1.36 mg/dL — ABNORMAL HIGH (ref 0.57–1.00)
Globulin, Total: 3.4 g/dL (ref 1.5–4.5)
Glucose: 99 mg/dL (ref 65–99)
Potassium: 4.4 mmol/L (ref 3.5–5.2)
Sodium: 140 mmol/L (ref 134–144)
Total Protein: 7.2 g/dL (ref 6.0–8.5)
eGFR: 44 mL/min/{1.73_m2} — ABNORMAL LOW (ref >59)

## 2020-08-17 NOTE — Telephone Encounter (Signed)
Pt verified by name and DOB, results given per provider, pt voiced understanding all question answered. 

## 2020-08-17 NOTE — Telephone Encounter (Signed)
Pt returned call. Please call back when available. 

## 2020-08-17 NOTE — Telephone Encounter (Signed)
-----  Message from Sarah J Slack, NP sent at 08/17/2020  5:57 AM EDT ----- Please call the patient, labs show increase in creatinine 1.36, this is up from 6 months ago at 0.93. Unclear etiology for this, needs to make sure drinking plenty of water. Sodium level is normal, which is of interest with carbamazepine. CBC is normal. AST, ALT are normal, had elevated last visit, likely due to increase Tylenol use at the time. Alk pho is very mildly elevated 132, follow overtime.  

## 2020-08-25 DIAGNOSIS — E785 Hyperlipidemia, unspecified: Secondary | ICD-10-CM | POA: Diagnosis not present

## 2020-08-25 DIAGNOSIS — R7303 Prediabetes: Secondary | ICD-10-CM | POA: Diagnosis not present

## 2020-08-25 DIAGNOSIS — E559 Vitamin D deficiency, unspecified: Secondary | ICD-10-CM | POA: Diagnosis not present

## 2020-08-25 DIAGNOSIS — I129 Hypertensive chronic kidney disease with stage 1 through stage 4 chronic kidney disease, or unspecified chronic kidney disease: Secondary | ICD-10-CM | POA: Diagnosis not present

## 2020-08-25 DIAGNOSIS — N183 Chronic kidney disease, stage 3 unspecified: Secondary | ICD-10-CM | POA: Diagnosis not present

## 2020-08-29 DIAGNOSIS — Z0289 Encounter for other administrative examinations: Secondary | ICD-10-CM

## 2020-09-06 ENCOUNTER — Telehealth: Payer: Self-pay | Admitting: *Deleted

## 2020-09-06 NOTE — Telephone Encounter (Signed)
[employer] disability attending physician forms received, completed and signed by Dr Jannifer Franklin. Sent to medical records for processing.

## 2020-09-15 DIAGNOSIS — E1169 Type 2 diabetes mellitus with other specified complication: Secondary | ICD-10-CM | POA: Diagnosis not present

## 2020-09-15 DIAGNOSIS — T7840XA Allergy, unspecified, initial encounter: Secondary | ICD-10-CM | POA: Diagnosis not present

## 2020-09-27 ENCOUNTER — Encounter: Payer: Self-pay | Admitting: Dietician

## 2020-09-27 ENCOUNTER — Encounter: Payer: Medicare Other | Attending: Family Medicine | Admitting: Dietician

## 2020-09-27 ENCOUNTER — Other Ambulatory Visit: Payer: Self-pay

## 2020-09-27 DIAGNOSIS — E119 Type 2 diabetes mellitus without complications: Secondary | ICD-10-CM

## 2020-09-27 DIAGNOSIS — E1169 Type 2 diabetes mellitus with other specified complication: Secondary | ICD-10-CM | POA: Diagnosis not present

## 2020-09-27 NOTE — Progress Notes (Signed)
Patient was seen on 09/27/2020 for the first of a series of three diabetes self-management courses at [facility].  Patient is here today with her daughter who is assisting her. Patient with poor vision.  She walks with a cane.  Frequent falls.  Quite unsteady today.  States that she does not feel well "my nerves" and is dizzy.  Daughter is a Psychologist, sport and exercise and checked her blood pressure which was 139/92.  Patient is currently wearing a Dexcom CGM.  Patient Education Plan per assessed needs and concerns is to attend three course education program for Diabetes Self Management Education.  The following learning objectives were met by the patient during this class: Describe diabetes, types of diabetes and pathophysiology State some common risk factors for diabetes Defines the role of glucose and insulin Describe the relationship between diabetes and cardiovascular and other risks State the members of the Healthcare Team States the rationale for glucose monitoring and when to test State their individual [place] the importance of logging glucose readings and how to interpret the readings Identifies A1C target Explain the correlation between A1c and eAG values State symptoms and treatment of high blood glucose and low blood glucose Explain proper technique for glucose testing and identify proper sharps disposal  Handouts given during class include: How to Thrive:  A Guide for Your Journey with Diabetes by the ADA Meal Plan Card and carbohydrate content list Dietary intake form Low Sodium Flavoring Tips Types of Fats Dining Out Label reading Snack list Planning a balanced meal The diabetes portion plate Diabetes Resources A1c to eAG Conversion Chart Blood Glucose Log Diabetes Recommended Care Schedule Support Group Diabetes Success Plan Core Class Satisfaction Survey   Follow-Up Plan: Attend core 2

## 2020-10-04 ENCOUNTER — Encounter: Payer: Self-pay | Admitting: Dietician

## 2020-10-04 ENCOUNTER — Other Ambulatory Visit: Payer: Self-pay

## 2020-10-04 ENCOUNTER — Encounter: Payer: Medicare Other | Admitting: Dietician

## 2020-10-04 DIAGNOSIS — E119 Type 2 diabetes mellitus without complications: Secondary | ICD-10-CM

## 2020-10-04 DIAGNOSIS — E1169 Type 2 diabetes mellitus with other specified complication: Secondary | ICD-10-CM | POA: Diagnosis not present

## 2020-10-04 NOTE — Progress Notes (Signed)
Patient was seen on 10/04/2020 for the second of a series of three diabetes self-management courses at the [facility]. The following learning objectives were met by the patient during this class:  Describe the role of different macronutrients on glucose Explain how carbohydrates affect blood glucose State what foods contain the most carbohydrates Demonstrate carbohydrate counting Demonstrate how to read Nutrition Facts food label Describe effects of various fats on heart health Describe the importance of good nutrition for health and healthy eating strategies Describe techniques for managing your shopping, cooking and meal planning List strategies to follow meal plan when dining out Describe the effects of alcohol on glucose and how to use it safely  Goals:  Follow Diabetes Meal Plan as instructed  Aim to spread carbs evenly throughout the day  Aim for 3 meals per day and snacks as needed Include lean protein foods to meals/snacks  Monitor glucose levels as instructed by your doctor   Follow-Up Plan: Attend Core 3 Work towards following your personal food plan.

## 2020-10-11 ENCOUNTER — Encounter: Payer: Medicare Other | Admitting: Dietician

## 2020-10-11 ENCOUNTER — Encounter: Payer: Self-pay | Admitting: Dietician

## 2020-10-11 ENCOUNTER — Other Ambulatory Visit: Payer: Self-pay

## 2020-10-11 DIAGNOSIS — E1169 Type 2 diabetes mellitus with other specified complication: Secondary | ICD-10-CM | POA: Diagnosis not present

## 2020-10-11 DIAGNOSIS — E119 Type 2 diabetes mellitus without complications: Secondary | ICD-10-CM

## 2020-10-11 NOTE — Progress Notes (Signed)
Patient was seen on 10/11/2020 for the third of a series of three diabetes self-management courses at the [facility].   State the amount of activity recommended for healthy living Describe activities suitable for individual needs Identify ways to regularly incorporate activity into daily life Identify barriers to activity and ways to over come these barriers Identify diabetes medications being personally used and their primary action for lowering glucose and possible side effects Describe role of stress on blood glucose and develop strategies to address psychosocial issues Identify diabetes complications and ways to prevent them Explain how to manage diabetes during illness Evaluate success in meeting personal goal Establish 2-3 goals that they will plan to diligently work on  Goals:  I will be active 20 minutes or more 3 times a week I will test my glucose at least 2 times a day, 5 days a week  Your patient has identified these potential barriers to change:  Motivation Finances Stress  Your patient has identified their diabetes self-care support plan as  Family Support    Plan:  Attend Support Group as desired

## 2020-10-13 DIAGNOSIS — E1169 Type 2 diabetes mellitus with other specified complication: Secondary | ICD-10-CM | POA: Diagnosis not present

## 2020-10-13 DIAGNOSIS — E785 Hyperlipidemia, unspecified: Secondary | ICD-10-CM | POA: Diagnosis not present

## 2020-10-13 DIAGNOSIS — I129 Hypertensive chronic kidney disease with stage 1 through stage 4 chronic kidney disease, or unspecified chronic kidney disease: Secondary | ICD-10-CM | POA: Diagnosis not present

## 2020-10-13 DIAGNOSIS — K219 Gastro-esophageal reflux disease without esophagitis: Secondary | ICD-10-CM | POA: Diagnosis not present

## 2020-10-13 DIAGNOSIS — N183 Chronic kidney disease, stage 3 unspecified: Secondary | ICD-10-CM | POA: Diagnosis not present

## 2020-10-13 DIAGNOSIS — M810 Age-related osteoporosis without current pathological fracture: Secondary | ICD-10-CM | POA: Diagnosis not present

## 2020-10-17 DIAGNOSIS — M810 Age-related osteoporosis without current pathological fracture: Secondary | ICD-10-CM | POA: Diagnosis not present

## 2020-10-18 ENCOUNTER — Ambulatory Visit: Payer: Medicare Other | Admitting: Allergy & Immunology

## 2020-10-18 ENCOUNTER — Other Ambulatory Visit: Payer: Self-pay

## 2020-10-18 VITALS — BP 128/64 | HR 88 | Temp 97.8°F | Resp 16 | Ht 65.0 in | Wt 238.7 lb

## 2020-10-18 DIAGNOSIS — J310 Chronic rhinitis: Secondary | ICD-10-CM | POA: Diagnosis not present

## 2020-10-18 DIAGNOSIS — T7800XD Anaphylactic reaction due to unspecified food, subsequent encounter: Secondary | ICD-10-CM

## 2020-10-18 MED ORDER — TRIAMCINOLONE ACETONIDE 0.1 % EX OINT: 1.0000 "application " | TOPICAL_OINTMENT | Freq: Two times a day (BID) | CUTANEOUS | 0 refills | Status: AC
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
## 2020-10-18 NOTE — Patient Instructions (Addendum)
1. Anaphylactic shock due to food - Testing to the gums and cinnamon and corn was negative. - We are going to get some blood work to confirm. - We will call you in 1-2 weeks with the results of the testing. - EpiPen training provided. - Anaphylaxis management plan provided.   2. Chronic rhinitis - Testing to the entire environmental allergen panel was negative. - We are confirming with blood work.  3. Return in about 3 months (around 01/18/2021).    Please inform us of any Emergency Department visits, hospitalizations, or changes in symptoms. Call us before going to the ED for breathing or allergy symptoms since we might be able to fit you in for a sick visit. Feel free to contact us anytime with any questions, problems, or concerns.  It was a pleasure to meet you today!  Websites that have reliable patient information: 1. American Academy of Asthma, Allergy, and Immunology: www.aaaai.org 2. Food Allergy Research and Education (FARE): foodallergy.org 3. Mothers of Asthmatics: http://www.asthmacommunitynetwork.org 4. American College of Allergy, Asthma, and Immunology: www.acaai.org   COVID-19 Vaccine Information can be found at: ShippingScam.co.uk For questions related to vaccine distribution or appointments, please email [email or web address] or call [phone].   We realize that you might be concerned about having an allergic reaction to the COVID19 vaccines. To help with that concern, WE ARE OFFERING THE COVID19 VACCINES IN OUR OFFICE! Ask the front desk for dates!     "Like" [place] on Facebook and Instagram for our latest updates!      A healthy democracy works best when [employer] participate! Make sure you are registered to vote! If you have moved or changed any of your contact information, you will need to get this updated before voting!  In some cases, you MAY be able to register to vote online:  [email or web address]

## 2020-10-19 ENCOUNTER — Encounter: Payer: Self-pay | Admitting: Allergy & Immunology

## 2020-10-20 NOTE — Addendum Note (Signed)
Addended by: Jacqualin Combes on: 10/20/2020 11:33 AM   Modules accepted: Orders

## 2020-10-22 LAB — XANTHAN GUM IGE
Class Interpretation: 0
Xanthan Gum, IgE*: <0.35 kU/L (ref <0.35)

## 2020-10-22 LAB — IGE+ALLERGENS ZONE 2(30)

## 2020-10-22 LAB — F297-IGE ACACIA GUM: F297-IgE Acacia Gum: <0.1 kU/L

## 2020-10-22 LAB — C074-IGE GELATIN: C074-IgE Gelatin: <0.1 kU/L

## 2020-10-22 LAB — ALLERGEN, RED (CARMINE) DYE, RF340: F340-IgE Carmine Red Dye: <0.1 kU/L

## 2020-10-22 LAB — ALLERGEN, CORN F8: Allergen Corn, IgE: <0.1 kU/L

## 2020-10-22 LAB — ALLERGEN, CINNAMON, RF220: Allergen Cinnamon IgE: <0.1 kU/L

## 2020-10-22 LAB — ALLERGEN SOYBEAN: Soybean IgE: <0.1 kU/L

## 2020-11-15 DIAGNOSIS — K219 Gastro-esophageal reflux disease without esophagitis: Secondary | ICD-10-CM | POA: Diagnosis not present

## 2020-11-15 DIAGNOSIS — E1169 Type 2 diabetes mellitus with other specified complication: Secondary | ICD-10-CM | POA: Diagnosis not present

## 2020-11-15 DIAGNOSIS — M810 Age-related osteoporosis without current pathological fracture: Secondary | ICD-10-CM | POA: Diagnosis not present

## 2020-11-15 DIAGNOSIS — E785 Hyperlipidemia, unspecified: Secondary | ICD-10-CM | POA: Diagnosis not present

## 2020-11-15 DIAGNOSIS — I129 Hypertensive chronic kidney disease with stage 1 through stage 4 chronic kidney disease, or unspecified chronic kidney disease: Secondary | ICD-10-CM | POA: Diagnosis not present

## 2020-11-15 DIAGNOSIS — N183 Chronic kidney disease, stage 3 unspecified: Secondary | ICD-10-CM | POA: Diagnosis not present

## 2020-11-25 ENCOUNTER — Ambulatory Visit: Payer: Medicare Other | Admitting: Podiatry

## 2020-11-25 ENCOUNTER — Other Ambulatory Visit: Payer: Self-pay

## 2020-11-25 ENCOUNTER — Encounter: Payer: Self-pay | Admitting: Podiatry

## 2020-11-25 DIAGNOSIS — L600 Ingrowing nail: Secondary | ICD-10-CM

## 2020-11-25 DIAGNOSIS — M779 Enthesopathy, unspecified: Secondary | ICD-10-CM | POA: Diagnosis not present

## 2020-11-25 NOTE — Progress Notes (Signed)
Subjective:   Patient ID: Summer [name], female   DOB: 65 y.o.   MRN: [id number]   HPI Patient states that her right big toenail has become very tender and she had had previous ingrown done several years ago and also states the metatarsal joint is moderately tender not to the same degree neuro   ROS      Objective:  Physical Exam  Vascular status intact diabetes under excellent control with A1c in the normal range with a damaged right hallux nail that is loose and painful and is growing in an abnormal fashion with discomfort which is been present for a fairly long period of time     Assessment:  Inflammatory MPJ discomfort improved but present right along with damaged right hallux toenail     Plan:  H&P reviewed both conditions.  For the inflamed MPJ I recommended rigid bottom shoes and continue range of motion exercises with possibility for further steroid but do not recommend today.  Her nail I recommended permanent removal due to the long-term damage of the bed and pain and she wants this done and I allowed her to read and then signed consent form.  I went ahead today and I infiltrated the right hallux 60 mg like Marcaine mixture sterile prep done and using sterile instrumentation remove the hallux nail exposed matrix applied phenol for applications 30 seconds followed by alcohol lavage sterile dressing gave instructions on soaks and leave dressing on 24 hours take off earlier if any throbbing were to occur.  Patient will be seen back and all questions answered and encouraged to call with concerns

## 2020-11-25 NOTE — Patient Instructions (Signed)

## 2020-11-28 DIAGNOSIS — I129 Hypertensive chronic kidney disease with stage 1 through stage 4 chronic kidney disease, or unspecified chronic kidney disease: Secondary | ICD-10-CM | POA: Diagnosis not present

## 2020-11-28 DIAGNOSIS — E785 Hyperlipidemia, unspecified: Secondary | ICD-10-CM | POA: Diagnosis not present

## 2020-11-28 DIAGNOSIS — Z0000 Encounter for general adult medical examination without abnormal findings: Secondary | ICD-10-CM | POA: Diagnosis not present

## 2020-11-28 DIAGNOSIS — E1169 Type 2 diabetes mellitus with other specified complication: Secondary | ICD-10-CM | POA: Diagnosis not present

## 2020-11-28 DIAGNOSIS — Z23 Encounter for immunization: Secondary | ICD-10-CM | POA: Diagnosis not present

## 2020-11-28 DIAGNOSIS — K219 Gastro-esophageal reflux disease without esophagitis: Secondary | ICD-10-CM | POA: Diagnosis not present

## 2020-11-28 DIAGNOSIS — E1122 Type 2 diabetes mellitus with diabetic chronic kidney disease: Secondary | ICD-10-CM | POA: Diagnosis not present

## 2020-11-28 DIAGNOSIS — M810 Age-related osteoporosis without current pathological fracture: Secondary | ICD-10-CM | POA: Diagnosis not present

## 2020-11-28 DIAGNOSIS — N183 Chronic kidney disease, stage 3 unspecified: Secondary | ICD-10-CM | POA: Diagnosis not present

## 2020-12-02 DIAGNOSIS — K219 Gastro-esophageal reflux disease without esophagitis: Secondary | ICD-10-CM | POA: Diagnosis not present

## 2020-12-02 DIAGNOSIS — M810 Age-related osteoporosis without current pathological fracture: Secondary | ICD-10-CM | POA: Diagnosis not present

## 2020-12-02 DIAGNOSIS — N183 Chronic kidney disease, stage 3 unspecified: Secondary | ICD-10-CM | POA: Diagnosis not present

## 2020-12-02 DIAGNOSIS — E1169 Type 2 diabetes mellitus with other specified complication: Secondary | ICD-10-CM | POA: Diagnosis not present

## 2020-12-02 DIAGNOSIS — I129 Hypertensive chronic kidney disease with stage 1 through stage 4 chronic kidney disease, or unspecified chronic kidney disease: Secondary | ICD-10-CM | POA: Diagnosis not present

## 2020-12-02 DIAGNOSIS — E785 Hyperlipidemia, unspecified: Secondary | ICD-10-CM | POA: Diagnosis not present

## 2020-12-06 DIAGNOSIS — H15002 Unspecified scleritis, left eye: Secondary | ICD-10-CM | POA: Diagnosis not present

## 2020-12-06 DIAGNOSIS — H3342 Traction detachment of retina, left eye: Secondary | ICD-10-CM | POA: Diagnosis not present

## 2020-12-06 DIAGNOSIS — E119 Type 2 diabetes mellitus without complications: Secondary | ICD-10-CM | POA: Diagnosis not present

## 2020-12-06 DIAGNOSIS — Z961 Presence of intraocular lens: Secondary | ICD-10-CM | POA: Diagnosis not present

## 2020-12-25 ENCOUNTER — Emergency Department (HOSPITAL_COMMUNITY): Payer: Medicare Other

## 2020-12-25 ENCOUNTER — Emergency Department (HOSPITAL_COMMUNITY)
Admission: EM | Admit: 2020-12-25 | Discharge: 2020-12-26 | Disposition: A | Discharge status: Home / Self Care | Payer: Medicare Other | Attending: Emergency Medicine | Admitting: Emergency Medicine

## 2020-12-25 ENCOUNTER — Other Ambulatory Visit: Payer: Self-pay

## 2020-12-25 DIAGNOSIS — Z96642 Presence of left artificial hip joint: Secondary | ICD-10-CM | POA: Insufficient documentation

## 2020-12-25 DIAGNOSIS — E119 Type 2 diabetes mellitus without complications: Secondary | ICD-10-CM | POA: Diagnosis not present

## 2020-12-25 DIAGNOSIS — Z79899 Other long term (current) drug therapy: Secondary | ICD-10-CM | POA: Insufficient documentation

## 2020-12-25 DIAGNOSIS — R109 Unspecified abdominal pain: Secondary | ICD-10-CM | POA: Diagnosis not present

## 2020-12-25 DIAGNOSIS — I10 Essential (primary) hypertension: Secondary | ICD-10-CM | POA: Diagnosis not present

## 2020-12-25 DIAGNOSIS — Z743 Need for continuous supervision: Secondary | ICD-10-CM | POA: Diagnosis not present

## 2020-12-25 DIAGNOSIS — R072 Precordial pain: Secondary | ICD-10-CM | POA: Diagnosis not present

## 2020-12-25 DIAGNOSIS — R197 Diarrhea, unspecified: Secondary | ICD-10-CM | POA: Diagnosis not present

## 2020-12-25 DIAGNOSIS — Z7982 Long term (current) use of aspirin: Secondary | ICD-10-CM | POA: Insufficient documentation

## 2020-12-25 DIAGNOSIS — R42 Dizziness and giddiness: Secondary | ICD-10-CM | POA: Diagnosis not present

## 2020-12-25 DIAGNOSIS — Z5321 Procedure and treatment not carried out due to patient leaving prior to being seen by health care provider: Secondary | ICD-10-CM | POA: Insufficient documentation

## 2020-12-25 DIAGNOSIS — R112 Nausea with vomiting, unspecified: Secondary | ICD-10-CM | POA: Diagnosis not present

## 2020-12-25 DIAGNOSIS — N390 Urinary tract infection, site not specified: Secondary | ICD-10-CM | POA: Diagnosis not present

## 2020-12-25 DIAGNOSIS — R110 Nausea: Secondary | ICD-10-CM | POA: Diagnosis not present

## 2020-12-25 DIAGNOSIS — R0789 Other chest pain: Secondary | ICD-10-CM | POA: Diagnosis not present

## 2020-12-25 DIAGNOSIS — Z20822 Contact with and (suspected) exposure to covid-19: Secondary | ICD-10-CM | POA: Diagnosis not present

## 2020-12-25 DIAGNOSIS — R1013 Epigastric pain: Secondary | ICD-10-CM | POA: Insufficient documentation

## 2020-12-25 DIAGNOSIS — R079 Chest pain, unspecified: Secondary | ICD-10-CM | POA: Diagnosis not present

## 2020-12-25 LAB — CBC
HCT: 37.2 % (ref 36.0–46.0)
Hemoglobin: 12.3 g/dL (ref 12.0–15.0)
MCH: 30.3 pg (ref 26.0–34.0)
MCHC: 33.1 g/dL (ref 30.0–36.0)
MCV: 91.6 fL (ref 80.0–100.0)
Platelets: 209 K/uL (ref 150–400)
RBC: 4.06 MIL/uL (ref 3.87–5.11)
RDW: 12.3 % (ref 11.5–15.5)
WBC: 5.4 K/uL (ref 4.0–10.5)
nRBC: 0 % (ref 0.0–0.2)

## 2020-12-25 LAB — BASIC METABOLIC PANEL
Anion gap: 6 (ref 5–15)
BUN: 15 mg/dL (ref 8–23)
CO2: 25 mmol/L (ref 22–32)
Calcium: 8.5 mg/dL — ABNORMAL LOW (ref 8.9–10.3)
Chloride: 107 mmol/L (ref 98–111)
Creatinine, Ser: 1.1 mg/dL — ABNORMAL HIGH (ref 0.44–1.00)
GFR, Estimated: 56 mL/min — ABNORMAL LOW (ref >60)
Glucose, Bld: 110 mg/dL — ABNORMAL HIGH (ref 70–99)
Potassium: 4 mmol/L (ref 3.5–5.1)
Sodium: 138 mmol/L (ref 135–145)

## 2020-12-25 LAB — TROPONIN I (HIGH SENSITIVITY): Troponin I (High Sensitivity): 4 ng/L (ref <18)

## 2020-12-25 NOTE — ED Triage Notes (Signed)
Patient bib [facility], patient went to UC today for n/v x 3 days, developed substernal chest pain while at [facility]. No acute findings on EKG.

## 2020-12-25 NOTE — ED Provider Notes (Signed)
Emergency Medicine Provider Triage Evaluation Note  Summer [name] , a 65 y.o. female  was evaluated in triage.  Pt complains of substernal chest pain.  Happened acutely at 2 PM, does not radiate.  Pain is constant.  Reports has been having 3 days of vomiting, no AP  Review of Systems  Positive: Chest pain, vomiting Negative: Shortness of breath, abdominal pain  Physical Exam  BP 135/73 (BP Location: Left Arm)   Pulse 72   Temp 98.7 F (37.1 C) (Oral)   Resp 16   Ht 5' 5.5" (1.664 m)   Wt 105.7 kg   SpO2 97%   BMI 38.18 kg/m  Gen:   Awake, no distress   Resp:  Normal effort  MSK:   Moves extremities without difficulty  Other:  Radial pulse equal bilaterally, abdomen soft and nontender  Medical Decision Making  Medically screening exam initiated at 3:01 PM.  Appropriate orders placed.  Summer [name] was informed that the remainder of the evaluation will be completed by another provider, this initial triage assessment does not replace that evaluation, and the importance of remaining in the ED until their evaluation is complete.  Chest pain work-up   Summer Raring, PA-C 12/25/20 [place], [place], MD 12/25/20 1940

## 2020-12-26 ENCOUNTER — Emergency Department (HOSPITAL_BASED_OUTPATIENT_CLINIC_OR_DEPARTMENT_OTHER)
Admission: EM | Admit: 2020-12-26 | Discharge: 2020-12-26 | Disposition: A | Discharge status: Home / Self Care | Payer: Medicare Other | Source: Home / Self Care | Attending: Emergency Medicine | Admitting: Emergency Medicine

## 2020-12-26 ENCOUNTER — Other Ambulatory Visit: Payer: Self-pay

## 2020-12-26 ENCOUNTER — Encounter (HOSPITAL_BASED_OUTPATIENT_CLINIC_OR_DEPARTMENT_OTHER): Payer: Self-pay

## 2020-12-26 ENCOUNTER — Emergency Department (HOSPITAL_BASED_OUTPATIENT_CLINIC_OR_DEPARTMENT_OTHER): Payer: Medicare Other

## 2020-12-26 DIAGNOSIS — Z20822 Contact with and (suspected) exposure to covid-19: Secondary | ICD-10-CM | POA: Insufficient documentation

## 2020-12-26 DIAGNOSIS — Z96642 Presence of left artificial hip joint: Secondary | ICD-10-CM | POA: Insufficient documentation

## 2020-12-26 DIAGNOSIS — R109 Unspecified abdominal pain: Secondary | ICD-10-CM | POA: Diagnosis not present

## 2020-12-26 DIAGNOSIS — E119 Type 2 diabetes mellitus without complications: Secondary | ICD-10-CM | POA: Insufficient documentation

## 2020-12-26 DIAGNOSIS — R112 Nausea with vomiting, unspecified: Secondary | ICD-10-CM | POA: Insufficient documentation

## 2020-12-26 DIAGNOSIS — Z7982 Long term (current) use of aspirin: Secondary | ICD-10-CM | POA: Insufficient documentation

## 2020-12-26 DIAGNOSIS — N390 Urinary tract infection, site not specified: Secondary | ICD-10-CM

## 2020-12-26 DIAGNOSIS — R1013 Epigastric pain: Secondary | ICD-10-CM | POA: Insufficient documentation

## 2020-12-26 DIAGNOSIS — Z79899 Other long term (current) drug therapy: Secondary | ICD-10-CM | POA: Insufficient documentation

## 2020-12-26 DIAGNOSIS — I10 Essential (primary) hypertension: Secondary | ICD-10-CM | POA: Insufficient documentation

## 2020-12-26 LAB — URINALYSIS, ROUTINE W REFLEX MICROSCOPIC
Bilirubin Urine: NEGATIVE
Glucose, UA: NEGATIVE mg/dL
Hgb urine dipstick: NEGATIVE
Ketones, ur: NEGATIVE mg/dL
Nitrite: NEGATIVE
Protein, ur: NEGATIVE mg/dL
Specific Gravity, Urine: 1.01 (ref 1.005–1.030)
pH: 5.5 (ref 5.0–8.0)

## 2020-12-26 LAB — COMPREHENSIVE METABOLIC PANEL
ALT: 13 U/L (ref 0–44)
AST: 20 U/L (ref 15–41)
Albumin: 3.5 g/dL (ref 3.5–5.0)
Alkaline Phosphatase: 95 U/L (ref 38–126)
Anion gap: 7 (ref 5–15)
BUN: 12 mg/dL (ref 8–23)
CO2: 24 mmol/L (ref 22–32)
Calcium: 8.7 mg/dL — ABNORMAL LOW (ref 8.9–10.3)
Chloride: 103 mmol/L (ref 98–111)
Creatinine, Ser: 1.18 mg/dL — ABNORMAL HIGH (ref 0.44–1.00)
GFR, Estimated: 52 mL/min — ABNORMAL LOW (ref >60)
Glucose, Bld: 96 mg/dL (ref 70–99)
Potassium: 3.9 mmol/L (ref 3.5–5.1)
Sodium: 134 mmol/L — ABNORMAL LOW (ref 135–145)
Total Bilirubin: 0.3 mg/dL (ref 0.3–1.2)
Total Protein: 8 g/dL (ref 6.5–8.1)

## 2020-12-26 LAB — CBG MONITORING, ED: Glucose-Capillary: 90 mg/dL (ref 70–99)

## 2020-12-26 LAB — CBC
HCT: 37 % (ref 36.0–46.0)
Hemoglobin: 12 g/dL (ref 12.0–15.0)
MCH: 29.6 pg (ref 26.0–34.0)
MCHC: 32.4 g/dL (ref 30.0–36.0)
MCV: 91.4 fL (ref 80.0–100.0)
Platelets: 192 10*3/uL (ref 150–400)
RBC: 4.05 MIL/uL (ref 3.87–5.11)
RDW: 12.3 % (ref 11.5–15.5)
WBC: 5.7 10*3/uL (ref 4.0–10.5)
nRBC: 0 % (ref 0.0–0.2)

## 2020-12-26 LAB — LIPASE, BLOOD: Lipase: 33 U/L (ref 11–51)

## 2020-12-26 LAB — RESP PANEL BY RT-PCR (FLU A&B, COVID) ARPGX2
Influenza A by PCR: NEGATIVE
Influenza B by PCR: NEGATIVE
SARS Coronavirus 2 by RT PCR: NEGATIVE

## 2020-12-26 LAB — URINALYSIS, MICROSCOPIC (REFLEX)

## 2020-12-26 LAB — TROPONIN I (HIGH SENSITIVITY): Troponin I (High Sensitivity): 4 ng/L (ref <18)

## 2020-12-26 MED ORDER — CEPHALEXIN 500 MG PO CAPS: 500.0000 mg | ORAL_CAPSULE | Freq: Three times a day (TID) | ORAL | 0 refills | Status: DC

## 2020-12-26 MED ORDER — SODIUM CHLORIDE 0.9 % IV SOLN
1.0000 g | Freq: Once | INTRAVENOUS | Status: AC
  Administered 2020-12-26: 1 g via INTRAVENOUS
  Filled 2020-12-26: qty 10

## 2020-12-26 MED ORDER — IOHEXOL 300 MG/ML  SOLN
100.0000 mL | Freq: Once | INTRAMUSCULAR | Status: AC | PRN
  Administered 2020-12-26: 100 mL via INTRAVENOUS

## 2020-12-26 MED ORDER — SODIUM CHLORIDE 0.9 % IV BOLUS
1000.0000 mL | Freq: Once | INTRAVENOUS | Status: AC
  Administered 2020-12-26: 1000 mL via INTRAVENOUS

## 2020-12-26 MED ORDER — ONDANSETRON HCL 4 MG/2ML IJ SOLN
4.0000 mg | Freq: Once | INTRAMUSCULAR | Status: AC
  Administered 2020-12-26: 4 mg via INTRAVENOUS
  Filled 2020-12-26: qty 2

## 2020-12-26 MED ORDER — ONDANSETRON 4 MG PO TBDP: ORAL_TABLET | ORAL | 0 refills | Status: DC
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
## 2020-12-26 NOTE — ED Notes (Signed)
Pt requesting some ginger ale to drink. RN Crystal informed

## 2020-12-26 NOTE — Discharge Instructions (Addendum)
Take Keflex 3 times daily for 5 days for UTI.  Take Zofran for nausea.  Stay hydrated  See your doctor for follow-up  Return to ER if you have worse abdominal pain, vomiting, fever, dehydration

## 2020-12-26 NOTE — ED Triage Notes (Signed)
Pt c/o n/v x 4 days-denies pain-slow steady gait

## 2020-12-30 DIAGNOSIS — N390 Urinary tract infection, site not specified: Secondary | ICD-10-CM | POA: Diagnosis not present

## 2020-12-30 DIAGNOSIS — E860 Dehydration: Secondary | ICD-10-CM | POA: Diagnosis not present

## 2020-12-30 DIAGNOSIS — E1169 Type 2 diabetes mellitus with other specified complication: Secondary | ICD-10-CM | POA: Diagnosis not present

## 2021-01-22 ENCOUNTER — Other Ambulatory Visit: Payer: Self-pay

## 2021-01-22 ENCOUNTER — Emergency Department (HOSPITAL_COMMUNITY): Payer: Medicare Other

## 2021-01-22 ENCOUNTER — Encounter (HOSPITAL_COMMUNITY): Payer: Self-pay | Admitting: Emergency Medicine

## 2021-01-22 ENCOUNTER — Emergency Department (HOSPITAL_COMMUNITY)
Admission: EM | Admit: 2021-01-22 | Discharge: 2021-01-22 | Disposition: A | Discharge status: Home / Self Care | Payer: Medicare Other | Attending: Emergency Medicine | Admitting: Emergency Medicine

## 2021-01-22 DIAGNOSIS — Z7982 Long term (current) use of aspirin: Secondary | ICD-10-CM | POA: Insufficient documentation

## 2021-01-22 DIAGNOSIS — S6991XA Unspecified injury of right wrist, hand and finger(s), initial encounter: Secondary | ICD-10-CM | POA: Diagnosis present

## 2021-01-22 DIAGNOSIS — Y9301 Activity, walking, marching and hiking: Secondary | ICD-10-CM | POA: Diagnosis not present

## 2021-01-22 DIAGNOSIS — M25532 Pain in left wrist: Secondary | ICD-10-CM | POA: Diagnosis not present

## 2021-01-22 DIAGNOSIS — S52501A Unspecified fracture of the lower end of right radius, initial encounter for closed fracture: Secondary | ICD-10-CM | POA: Insufficient documentation

## 2021-01-22 DIAGNOSIS — R600 Localized edema: Secondary | ICD-10-CM | POA: Diagnosis not present

## 2021-01-22 DIAGNOSIS — Z743 Need for continuous supervision: Secondary | ICD-10-CM | POA: Diagnosis not present

## 2021-01-22 DIAGNOSIS — I10 Essential (primary) hypertension: Secondary | ICD-10-CM | POA: Insufficient documentation

## 2021-01-22 DIAGNOSIS — Z96642 Presence of left artificial hip joint: Secondary | ICD-10-CM | POA: Diagnosis not present

## 2021-01-22 DIAGNOSIS — E119 Type 2 diabetes mellitus without complications: Secondary | ICD-10-CM | POA: Insufficient documentation

## 2021-01-22 DIAGNOSIS — Z79899 Other long term (current) drug therapy: Secondary | ICD-10-CM | POA: Insufficient documentation

## 2021-01-22 DIAGNOSIS — W010XXA Fall on same level from slipping, tripping and stumbling without subsequent striking against object, initial encounter: Secondary | ICD-10-CM | POA: Insufficient documentation

## 2021-01-22 MED ORDER — ACETAMINOPHEN 500 MG PO TABS
1000.0000 mg | ORAL_TABLET | Freq: Once | ORAL | Status: AC
  Administered 2021-01-22: 23:00:00 1000 mg via ORAL
  Filled 2021-01-22: qty 2

## 2021-01-22 NOTE — ED Triage Notes (Addendum)
Pt to triage via [facility] from home.  Pt tripped and fell over her Crocs just PTA.  C/o R wrist pain with deformity.  CMS intact.  Denies head and back pain.  No blood thinners.  No LOC.

## 2021-01-22 NOTE — Discharge Instructions (Addendum)
1) Please follow up with orthopedic surgery in 3-5 days for reassessment and to obtain repeat imaging.  2) Tylenol and Motrin as needed for pain.  3) Keep right arm elevated to help with swelling. Keep splint clean and dry.   4) Return to ED with worsening swelling, pain, new tingling in fingers on right hand.

## 2021-01-22 NOTE — ED Notes (Signed)
Ortho tech at bedside 

## 2021-01-22 NOTE — ED Provider Notes (Signed)
Emergency Medicine Provider Triage Evaluation Note  Summer [name] , a 65 y.o. female  was evaluated in triage.  Patient presents after having a fall today where she fell over her crocs.  This happened prior to arrival.  She complains of right wrist pain.  She has no numbness or tingling in the hand.  She did not hit her head.  Denies loss of consciousness.  Denies neck pain.  Not on blood thinners.  Review of Systems  Positive: See above Negative:   Physical Exam  BP 115/81   Pulse 82   Temp 98.5 F (36.9 C)   Resp 16   SpO2 94%  Gen:   Awake, no distress  Resp:  Normal effort  MSK:   Moves extremities without difficulty  Other:  Deformity of right wrist. Radial pulse 2+ bilat. Sensation intact  Medical Decision Making  Medically screening exam initiated at 7:13 PM.  Appropriate orders placed.  Jonna Eckmann was informed that the remainder of the evaluation will be completed by another provider, this initial triage assessment does not replace that evaluation, and the importance of remaining in the ED until their evaluation is complete.     Sheila Oats 01/22/21 1914    Carmin Muskrat, MD 01/22/21 2030
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
## 2021-01-23 ENCOUNTER — Encounter (HOSPITAL_COMMUNITY): Payer: Self-pay | Admitting: Orthopedic Surgery

## 2021-01-23 DIAGNOSIS — S52501A Unspecified fracture of the lower end of right radius, initial encounter for closed fracture: Secondary | ICD-10-CM | POA: Diagnosis not present

## 2021-01-23 NOTE — Progress Notes (Signed)
Orthopedic Tech Progress Note Patient Details:  Summer [name] 01-22-1956 [id number]  Ortho Devices Type of Ortho Device: Arm sling, Sugartong splint Ortho Device/Splint Location: rue Ortho Device/Splint Interventions: Ordered, Application, Adjustment   Post Interventions Patient Tolerated: Well Instructions Provided: Care of device, Adjustment of device  Karolee Stamps 01/23/2021, 12:49 AM
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
## 2021-01-23 NOTE — Anesthesia Preprocedure Evaluation (Addendum)
Anesthesia Evaluation  Patient identified by MRN, date of birth, ID band Patient awake    Reviewed: Allergy & Precautions, NPO status , Patient's Chart, lab work & pertinent test results, reviewed documented beta blocker date and time   History of Anesthesia Complications (+) DIFFICULT AIRWAY and history of anesthetic complications  Airway Mallampati: III  TM Distance: <3 FB Neck ROM: Full    Dental  (+) Dental Advisory Given   Pulmonary neg pulmonary ROS,    Pulmonary exam normal        Cardiovascular hypertension, Pt. on medications and Pt. on home beta blockers Normal cardiovascular exam   '22 Myoperfusion - 1. Mild reversibility involving the apex and apical segment of the inferior wall. Findings are worrisome for reversible ischemia at these areas. 2. Normal left ventricular wall motion. 3. Left ventricular ejection fraction 66% 4. Non invasive risk stratification*: Intermediate  See PAT note - no cath done, decision made for medical management    Neuro/Psych  Headaches, PSYCHIATRIC DISORDERS Anxiety Depression  Blind left eye BPPV     GI/Hepatic Neg liver ROS, GERD  Controlled and Medicated,  Endo/Other  diabetes Obesity   Renal/GU negative Renal ROS     Musculoskeletal  (+) Arthritis ,   Abdominal   Peds  Hematology negative hematology ROS (+)   Anesthesia Other Findings   Reproductive/Obstetrics                           Anesthesia Physical Anesthesia Plan  ASA: 3  Anesthesia Plan: Regional   Post-op Pain Management: Regional block   Induction:   PONV Risk Score and Plan: 2 and Propofol infusion and Treatment may vary due to age or medical condition  Airway Management Planned: Natural Airway and Simple Face Mask  Additional Equipment: None  Intra-op Plan:   Post-operative Plan:   Informed Consent: I have reviewed the patients History and Physical, chart, labs  and discussed the procedure including the risks, benefits and alternatives for the proposed anesthesia with the patient or authorized representative who has indicated his/her understanding and acceptance.       Plan Discussed with: CRNA and Anesthesiologist  Anesthesia Plan Comments:       Anesthesia Quick Evaluation

## 2021-01-23 NOTE — Progress Notes (Addendum)
COVID swab appointment: N/A  COVID Vaccine Completed:  Yes x2 Date COVID Vaccine completed: Has received booster:  Yes x1 COVID vaccine manufacturer: [place]      Date of COVID positive in last 90 days:  No  PCP - Harlan Stains, MD Cardiologist - Quay Burow, MD  Chest x-ray - 12-25-20 Epic EKG - 12-26-20 Epic Stress Test - 05-09-20 Epic ECHO - N/A Cardiac Cath - N/A Pacemaker/ICD device last checked: Spinal Cord Stimulator: Long Term Monitor - 2021 Epic  Sleep Study - N/A CPAP -   Fasting Blood Sugar -  Checks Blood Sugar  does not check   Blood Thinner Instructions: Aspirin Instructions: ASA 81 mg  Last Dose: 01-23-21  Activity level:  Can go up a flight of stairs and perform activities of daily living without stopping and without symptoms of chest pain or shortness of breath.    Anesthesia review:  Hx of chest pain, tachycardia. HTN, DM.  Chest pain in urgent care on 12-25-20 thought to be musculoskeletal but sent to ER for evaluation  Patient states that the chest pain is episodic and currently no chest pain.  Denies SOB.  Pain in your eyes/face takes Tegretol and Keppra for this, no seizures  Patient denies shortness of breath, fever, cough and chest pain at PAT appointment (completed over the phone)  Patient verbalized understanding of instructions that were given to them at the PAT appointment. Patient was also instructed that they will need to review over the PAT instructions again at home before surgery.
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
## 2021-01-24 ENCOUNTER — Ambulatory Visit (HOSPITAL_COMMUNITY): Payer: Medicare Other | Admitting: Certified Registered Nurse Anesthetist

## 2021-01-24 ENCOUNTER — Encounter (HOSPITAL_COMMUNITY)
Admission: RE | Disposition: A | Discharge status: Home / Self Care | Payer: Self-pay | Source: Home / Self Care | Attending: Orthopedic Surgery

## 2021-01-24 ENCOUNTER — Encounter (HOSPITAL_COMMUNITY): Payer: Self-pay | Admitting: Orthopedic Surgery

## 2021-01-24 ENCOUNTER — Ambulatory Visit (HOSPITAL_COMMUNITY)
Admission: RE | Admit: 2021-01-24 | Discharge: 2021-01-24 | Disposition: A | Discharge status: Home / Self Care | Payer: Medicare Other | Attending: Orthopedic Surgery | Admitting: Orthopedic Surgery

## 2021-01-24 DIAGNOSIS — S52571A Other intraarticular fracture of lower end of right radius, initial encounter for closed fracture: Secondary | ICD-10-CM | POA: Insufficient documentation

## 2021-01-24 DIAGNOSIS — S52501A Unspecified fracture of the lower end of right radius, initial encounter for closed fracture: Secondary | ICD-10-CM | POA: Diagnosis not present

## 2021-01-24 DIAGNOSIS — K219 Gastro-esophageal reflux disease without esophagitis: Secondary | ICD-10-CM | POA: Insufficient documentation

## 2021-01-24 DIAGNOSIS — M1990 Unspecified osteoarthritis, unspecified site: Secondary | ICD-10-CM | POA: Insufficient documentation

## 2021-01-24 DIAGNOSIS — I10 Essential (primary) hypertension: Secondary | ICD-10-CM | POA: Diagnosis not present

## 2021-01-24 DIAGNOSIS — W1809XA Striking against other object with subsequent fall, initial encounter: Secondary | ICD-10-CM | POA: Insufficient documentation

## 2021-01-24 DIAGNOSIS — F32A Depression, unspecified: Secondary | ICD-10-CM | POA: Insufficient documentation

## 2021-01-24 DIAGNOSIS — R519 Headache, unspecified: Secondary | ICD-10-CM | POA: Diagnosis not present

## 2021-01-24 DIAGNOSIS — H5462 Unqualified visual loss, left eye, normal vision right eye: Secondary | ICD-10-CM | POA: Diagnosis not present

## 2021-01-24 DIAGNOSIS — F419 Anxiety disorder, unspecified: Secondary | ICD-10-CM | POA: Diagnosis not present

## 2021-01-24 DIAGNOSIS — H8110 Benign paroxysmal vertigo, unspecified ear: Secondary | ICD-10-CM | POA: Insufficient documentation

## 2021-01-24 DIAGNOSIS — E785 Hyperlipidemia, unspecified: Secondary | ICD-10-CM | POA: Diagnosis not present

## 2021-01-24 DIAGNOSIS — E119 Type 2 diabetes mellitus without complications: Secondary | ICD-10-CM | POA: Insufficient documentation

## 2021-01-24 HISTORY — PX: OPEN REDUCTION INTERNAL FIXATION (ORIF) DISTAL RADIAL FRACTURE: SHX5989

## 2021-01-24 HISTORY — DX: Unspecified osteoarthritis, unspecified site: M19.90

## 2021-01-24 LAB — HEMOGLOBIN A1C
Hgb A1c MFr Bld: 5.9 % — ABNORMAL HIGH (ref 4.8–5.6)
Mean Plasma Glucose: 122.63 mg/dL

## 2021-01-24 SURGERY — OPEN REDUCTION INTERNAL FIXATION (ORIF) DISTAL RADIUS FRACTURE
Anesthesia: Regional | Site: Arm Lower | Laterality: Right

## 2021-01-24 MED ORDER — ONDANSETRON HCL 4 MG/2ML IJ SOLN: 4.0000 mg | Freq: Once | INTRAMUSCULAR | Status: DC | PRN

## 2021-01-24 MED ORDER — OXYCODONE HCL 5 MG PO TABS: 5.0000 mg | ORAL_TABLET | Freq: Once | ORAL | Status: DC | PRN

## 2021-01-24 MED ORDER — CEFAZOLIN SODIUM-DEXTROSE 2-4 GM/100ML-% IV SOLN
2.0000 g | INTRAVENOUS | Status: AC
  Administered 2021-01-24: 2 g via INTRAVENOUS

## 2021-01-24 MED ORDER — ORAL CARE MOUTH RINSE: 15.0000 mL | Freq: Once | OROMUCOSAL | Status: AC

## 2021-01-24 MED ORDER — ACETAMINOPHEN 500 MG PO TABS
1000.0000 mg | ORAL_TABLET | Freq: Once | ORAL | Status: AC
  Administered 2021-01-24: 1000 mg via ORAL

## 2021-01-24 MED ORDER — MIDAZOLAM HCL 2 MG/2ML IJ SOLN
INTRAMUSCULAR | Status: DC | PRN
  Administered 2021-01-24 (×2): 1 mg via INTRAVENOUS

## 2021-01-24 MED ORDER — ONDANSETRON HCL 4 MG PO TABS: 4.0000 mg | ORAL_TABLET | Freq: Three times a day (TID) | ORAL | 0 refills | Status: DC | PRN

## 2021-01-24 MED ORDER — PROPOFOL 500 MG/50ML IV EMUL
INTRAVENOUS | Status: DC | PRN
  Administered 2021-01-24: 75 ug/kg/min via INTRAVENOUS

## 2021-01-24 MED ORDER — 0.9 % SODIUM CHLORIDE (POUR BTL) OPTIME
TOPICAL | Status: DC | PRN
  Administered 2021-01-24: 1000 mL

## 2021-01-24 MED ORDER — LIDOCAINE 2% (20 MG/ML) 5 ML SYRINGE
INTRAMUSCULAR | Status: DC | PRN
  Administered 2021-01-24: 40 mg via INTRAVENOUS

## 2021-01-24 MED ORDER — OXYCODONE HCL 5 MG/5ML PO SOLN: 5.0000 mg | Freq: Once | ORAL | Status: DC | PRN

## 2021-01-24 MED ORDER — CHLORHEXIDINE GLUCONATE 0.12 % MT SOLN
15.0000 mL | Freq: Once | OROMUCOSAL | Status: AC
  Administered 2021-01-24: 15 mL via OROMUCOSAL

## 2021-01-24 MED ORDER — FENTANYL CITRATE (PF) 100 MCG/2ML IJ SOLN
INTRAMUSCULAR | Status: DC | PRN
  Administered 2021-01-24 (×2): 50 ug via INTRAVENOUS

## 2021-01-24 MED ORDER — FENTANYL CITRATE PF 50 MCG/ML IJ SOSY: 25.0000 ug | PREFILLED_SYRINGE | INTRAMUSCULAR | Status: DC | PRN

## 2021-01-24 MED ORDER — LACTATED RINGERS IV SOLN: INTRAVENOUS | Status: DC

## 2021-01-24 MED ORDER — POVIDONE-IODINE 10 % EX SWAB: 2.0000 "application " | Freq: Once | CUTANEOUS | Status: DC

## 2021-01-24 SURGICAL SUPPLY — 67 items
BAG COUNTER SPONGE SURGICOUNT (BAG) ×4 IMPLANT
BAG SPNG CNTER NS LX DISP (BAG) ×2
BIT DRILL 2.2 SS TIBIAL (BIT) ×1 IMPLANT
BLADE MINI RND TIP GREEN BEAV (BLADE) IMPLANT
BLADE SURG 15 STRL LF DISP TIS (BLADE) ×1 IMPLANT
BLADE SURG 15 STRL SS (BLADE) ×2
BNDG CMPR 9X4 STRL LF SNTH (GAUZE/BANDAGES/DRESSINGS) ×1
BNDG COHESIVE 4X5 TAN ST LF (GAUZE/BANDAGES/DRESSINGS) ×1 IMPLANT
BNDG ELASTIC 3X5.8 VLCR STR LF (GAUZE/BANDAGES/DRESSINGS) ×1 IMPLANT
BNDG ELASTIC 4X5.8 VLCR STR LF (GAUZE/BANDAGES/DRESSINGS) IMPLANT
BNDG ESMARK 4X9 LF (GAUZE/BANDAGES/DRESSINGS) ×2 IMPLANT
CLSR STERI-STRIP ANTIMIC 1/2X4 (GAUZE/BANDAGES/DRESSINGS) ×1 IMPLANT
CORD BIPOLAR FORCEPS 12FT (ELECTRODE) ×2 IMPLANT
COVER BACK TABLE 60X90IN (DRAPES) ×2 IMPLANT
COVER SURGICAL LIGHT HANDLE (MISCELLANEOUS) ×1 IMPLANT
CUFF TOURN SGL QUICK 18X4 (TOURNIQUET CUFF) ×1 IMPLANT
DECANTER SPIKE VIAL GLASS SM (MISCELLANEOUS) ×2 IMPLANT
DRAPE C-ARM 42X120 X-RAY (DRAPES) ×2 IMPLANT
DRAPE EXTREMITY T 121X128X90 (DISPOSABLE) ×2 IMPLANT
DRAPE IMP U-DRAPE 54X76 (DRAPES) ×2 IMPLANT
DRAPE OEC MINIVIEW 54X84 (DRAPES) ×1 IMPLANT
DRAPE SURG 17X11 SM STRL (DRAPES) ×2 IMPLANT
DRAPE SURG 17X23 STRL (DRAPES) ×2 IMPLANT
DURAPREP 26ML APPLICATOR (WOUND CARE) ×2 IMPLANT
GAUZE SPONGE 4X4 12PLY STRL (GAUZE/BANDAGES/DRESSINGS) ×2 IMPLANT
GLOVE SRG 8 PF TXTR STRL LF DI (GLOVE) ×2 IMPLANT
GLOVE SURG ENC MOIS LTX SZ7 (GLOVE) ×2 IMPLANT
GLOVE SURG ORTHO LTX SZ7.5 (GLOVE) ×3 IMPLANT
GLOVE SURG POLY ORTHO LF SZ7.5 (GLOVE) ×1 IMPLANT
GLOVE SURG POLYISO LF SZ7 (GLOVE) ×1 IMPLANT
GLOVE SURG UNDER POLY LF SZ7 (GLOVE) ×3 IMPLANT
GLOVE SURG UNDER POLY LF SZ8 (GLOVE) ×4
GOWN STRL REUS W/ TWL LRG LVL3 (GOWN DISPOSABLE) ×1 IMPLANT
GOWN STRL REUS W/ TWL XL LVL3 (GOWN DISPOSABLE) ×1 IMPLANT
GOWN STRL REUS W/TWL LRG LVL3 (GOWN DISPOSABLE) ×3 IMPLANT
GOWN STRL REUS W/TWL XL LVL3 (GOWN DISPOSABLE) ×2
K-WIRE 1.6 (WIRE) ×6
K-WIRE FX5X1.6XNS BN SS (WIRE) ×3
KIT BASIN OR (CUSTOM PROCEDURE TRAY) ×2 IMPLANT
KWIRE FX5X1.6XNS BN SS (WIRE) IMPLANT
NDL HYPO 25X1 1.5 SAFETY (NEEDLE) IMPLANT
NEEDLE HYPO 25X1 1.5 SAFETY (NEEDLE) IMPLANT
NS IRRIG 1000ML POUR BTL (IV SOLUTION) ×2 IMPLANT
PACK GENERAL/GYN (CUSTOM PROCEDURE TRAY) ×2 IMPLANT
PAD CAST 3X4 CTTN HI CHSV (CAST SUPPLIES) IMPLANT
PAD CAST 4YDX4 CTTN HI CHSV (CAST SUPPLIES) IMPLANT
PADDING CAST COTTON 3X4 STRL (CAST SUPPLIES) ×2
PADDING CAST COTTON 4X4 STRL (CAST SUPPLIES)
PEG LOCKING SMOOTH 2.2X16 (Screw) ×1 IMPLANT
PEG LOCKING SMOOTH 2.2X20 (Screw) ×5 IMPLANT
PLATE CROSSLOCK MINI RT (Plate) ×1 IMPLANT
SCREW LOCK 14X2.7X 3 LD TPR (Screw) IMPLANT
SCREW LOCKING 2.7X14 (Screw) ×2 IMPLANT
SCREW LOCKING 2.7X15MM (Screw) ×1 IMPLANT
SLING ARM IMMOBILIZER LRG (SOFTGOODS) ×1 IMPLANT
SPLINT CAST 1 STEP 4X15 (MISCELLANEOUS) ×1 IMPLANT
STRIP CLOSURE SKIN 1/2X4 (GAUZE/BANDAGES/DRESSINGS) ×1 IMPLANT
SUCTION FRAZIER HANDLE 10FR (MISCELLANEOUS) ×2
SUCTION TUBE FRAZIER 10FR DISP (MISCELLANEOUS) ×1 IMPLANT
SUT NYLON 3 0 (SUTURE) ×4 IMPLANT
SUT VIC AB 2-0 CT2 27 (SUTURE) ×1 IMPLANT
SUT VIC AB 2-0 SH 18 (SUTURE) ×1 IMPLANT
SUT VIC AB 3-0 SH 18 (SUTURE) ×2 IMPLANT
SYR BULB EAR ULCER 3OZ GRN STR (SYRINGE) ×2 IMPLANT
SYR CONTROL 10ML LL (SYRINGE) IMPLANT
TUBING CONNECTING 10 (TUBING) ×2 IMPLANT
UNDERPAD 30X36 HEAVY ABSORB (UNDERPADS AND DIAPERS) ×3 IMPLANT

## 2021-01-24 NOTE — Anesthesia Procedure Notes (Signed)
Procedure Name: MAC Date/Time: 01/24/2021 7:49 AM Performed by: Claudia Desanctis, CRNA Pre-anesthesia Checklist: Patient identified, Emergency Drugs available, Suction available and Patient being monitored Patient Re-evaluated:Patient Re-evaluated prior to induction Oxygen Delivery Method: Simple face mask

## 2021-01-24 NOTE — Anesthesia Postprocedure Evaluation (Signed)
Anesthesia Post Note  Patient: TIAN DAVISON  Procedure(s) Performed: OPEN REDUCTION INTERNAL FIXATION (ORIF) DISTAL RADIAL FRACTURE (Right: Arm Lower)     Patient location during evaluation: PACU Anesthesia Type: Regional Level of consciousness: awake and alert Pain management: pain level controlled Vital Signs Assessment: post-procedure vital signs reviewed and stable Respiratory status: spontaneous breathing, nonlabored ventilation, respiratory function stable and patient connected to nasal cannula oxygen Cardiovascular status: stable and blood pressure returned to baseline Anesthetic complications: no   No notable events documented.  Last Vitals:  Vitals:   01/24/21 1030 01/24/21 1052  BP: 136/69 (!) 153/74  Pulse: 94 84  Resp: 17   Temp: 36.4 C   SpO2: 93%     Last Pain:  Vitals:   01/24/21 1030  TempSrc:   PainSc: 0-No pain                 Audry Pili

## 2021-01-24 NOTE — Transfer of Care (Signed)
Immediate Anesthesia Transfer of Care Note  Patient: Summer [name]  Procedure(s) Performed: OPEN REDUCTION INTERNAL FIXATION (ORIF) DISTAL RADIAL FRACTURE (Right: Arm Lower)  Patient Location: PACU  Anesthesia Type:MAC combined with regional for post-op pain  Level of Consciousness: awake and patient cooperative  Airway & Oxygen Therapy: Patient Spontanous Breathing and Patient connected to face mask  Post-op Assessment: Report given to RN and Post -op Vital signs reviewed and stable  Post vital signs: Reviewed and stable  Last Vitals:  Vitals Value Taken Time  BP 143/66 01/24/21 0947  Temp    Pulse 99 01/24/21 0949  Resp 20 01/24/21 0949  SpO2 98 % 01/24/21 0949  Vitals shown include unvalidated device data.  Last Pain:  Vitals:   01/24/21 0613  TempSrc:   PainSc: 8       Patients Stated Pain Goal: 5 (16/10/96 0454)  Complications: No notable events documented.

## 2021-01-24 NOTE — Progress Notes (Signed)
The risks benefits and alternatives were discussed with the patient including but not limited to the risks of nonoperative treatment, versus surgical intervention including infection, bleeding, nerve injury, malunion, nonunion, the need for revision surgery, hardware prominence, hardware failure, the need for hardware removal, blood clots, cardiopulmonary complications, morbidity, mortality, among others, and they were willing to proceed.    Plan right distal radius ORIF.

## 2021-01-24 NOTE — Anesthesia Procedure Notes (Signed)
Anesthesia Regional Block: Supraclavicular block   Pre-Anesthetic Checklist: , timeout performed,  Correct Patient, Correct Site, Correct Laterality,  Correct Procedure, Correct Position, site marked,  Risks and benefits discussed,  Surgical consent,  Pre-op evaluation,  At surgeon's request and post-op pain management  Laterality: Right  Prep: chloraprep       Needles:  Injection technique: Single-shot  Needle Type: Echogenic Needle     Needle Length: 5cm  Needle Gauge: 21     Additional Needles:   Narrative:  Start time: 01/24/2021 6:54 AM End time: 01/24/2021 6:58 AM Injection made incrementally with aspirations every 5 mL.  Performed by: Personally  Anesthesiologist: Audry Pili, MD  Additional Notes: No pain on injection. No increased resistance to injection. Injection made in 5cc increments. Good needle visualization. Patient tolerated the procedure well.

## 2021-01-24 NOTE — Discharge Instructions (Signed)
Diet: As you were doing prior to hospitalization   Shower:  May shower but keep the wounds dry, use an occlusive plastic wrap, NO SOAKING IN TUB. Leave the splint in place and keep the splint dry with a plastic bag.  Dressing:  Just leave the splint in place and we will change your bandages during your first follow-up appointment. There are sticky tapes (steri-strips) on your wounds and all the stitches are absorbable.  Leave the steri-strips in place when changing your dressings, they will peel off with time, usually 2-3 weeks.  Activity:  Increase activity slowly as tolerated, but follow the weight bearing instructions below.  The rules on driving is that you can not be taking narcotics while you drive, and you must feel in control of the vehicle.    Weight Bearing:   No bearing weight with right arm. You may use for sling for comfort.  To prevent constipation: you may use a stool softener such as -  Colace (over the counter) 100 mg by mouth twice a day  Drink plenty of fluids (prune juice may be helpful) and high fiber foods Miralax (over the counter) for constipation as needed.    Itching:  If you experience itching with your medications, try taking only a single pain pill, or even half a pain pill at a time.  You may take up to 10 pain pills per day, and you can also use benadryl over the counter for itching or also to help with sleep.   Precautions:  If you experience chest pain or shortness of breath - call 911 immediately for transfer to the hospital emergency department!!  If you develop a fever greater that 101 F, purulent drainage from wound, increased redness or drainage from wound, or calf pain -- Call the office at [phone]                                                Follow- Up Appointment:  Please call for an appointment to be seen in 2 weeks [place] - [phone]

## 2021-01-24 NOTE — Op Note (Signed)
01/24/2021  9:34 AM  PATIENT:  Summer [name]    PRE-OPERATIVE DIAGNOSIS:  right distal radius fracture, comminuted, intraarticular  POST-OPERATIVE DIAGNOSIS:  Same  PROCEDURE:  ORIF DISTAL RADIUS FRACTURE, 3 PIECES  SURGEON:  Johnny Bridge, MD  PHYSICIAN ASSISTANT: Merlene Pulling, PA-C, present and scrubbed throughout the case, critical for completion in a timely fashion, and for retraction, instrumentation, and closure.  ANESTHESIA:   General  ESTIMATED BLOOD LOSS: Minimal  PREOPERATIVE INDICATIONS:  [name] is a  65 y.o. female with a diagnosis of right distal radius fracture who elected for surgical management due to fracture displacement.    The risks benefits and alternatives were discussed with the patient preoperatively including but not limited to the risks of infection, bleeding, nerve injury, cardiopulmonary complications, the need for revision surgery, tendon rupture, hardware prominence, hardware failure, nonunion, malunion, post-traumatic arthritis, regional pain syndrome, among others, and the patient was willing to proceed.  OPERATIVE IMPLANTS: Biomet DVR volar plate with 2 proximal cortical screws and multiple distal interlocking smooth pegs, using the short standard with plate.   OPERATIVE FINDINGS: Comminution of the distal radius fracture.  UNIQUE ASPECTS OF THE CASE: There was substantial intra-articular comminution, this is involving a separate radial styloid piece as well as multiple other pieces in the coronal and sagittal planes.  OPERATIVE PROCEDURE: The patient was brought to the operating room and placed in the supine position. General anesthesia was administered. IV antibiotics were given. Time out was performed. The upper extremity was prepped and draped in usual sterile fashion. The arm was elevated and exsanguinated and the tourniquet was inflated at 28mm hg.    Volar approach to the distal radius was carried out, and the flexor carpi  radialis was retracted ulnarly. The radial artery was protected throughout the case.   Deep dissection was carried down, and the pronator quadratus was elevated off of the radius. The fracture site was identified and cleaned and reduced anatomically. This keyed into place nicely.  The radial styloid segment was separate and it took multiple attempts to achieve a satisfactory reduction of this.  I held this provisionally with a K wire, and C-arm used to confirm alignment.   I had restored height and inclination and then applied a volar plate. A K wire was used to confirm appropriate position of the plate, and once I was satisfied with the overall alignment I was able to secure the plate proximally with a cortical screw.   I then secured the fracture with multiple smooth interlocking pegs distally, and confirmed that none of these were in the joint, and none of these were penetrating the dorsal cortex. I also secured the plate proximally with one more cortical screw.   The wounds were irrigated copiously, and I used 3-0 subcutaneous Vicryl for the skin and Steri-Strips and sterile gauze and a volar splint. The tourniquet was released.  There was no injury to the radial artery.  She was awakened and returned back in stable and satisfactory condition. There were no complications and She tolerated the procedure well.

## 2021-01-26 ENCOUNTER — Encounter (HOSPITAL_COMMUNITY): Payer: Self-pay | Admitting: Orthopedic Surgery

## 2021-01-26 ENCOUNTER — Ambulatory Visit: Payer: Medicare Other | Admitting: Allergy & Immunology

## 2021-02-07 DIAGNOSIS — S52501D Unspecified fracture of the lower end of right radius, subsequent encounter for closed fracture with routine healing: Secondary | ICD-10-CM | POA: Diagnosis not present

## 2021-02-28 ENCOUNTER — Ambulatory Visit: Payer: Medicare Other | Admitting: Neurology

## 2021-02-28 ENCOUNTER — Encounter: Payer: Self-pay | Admitting: Neurology

## 2021-02-28 VITALS — BP 122/79 | HR 70 | Ht 65.0 in | Wt 225.0 lb

## 2021-02-28 DIAGNOSIS — J301 Allergic rhinitis due to pollen: Secondary | ICD-10-CM | POA: Insufficient documentation

## 2021-02-28 DIAGNOSIS — R296 Repeated falls: Secondary | ICD-10-CM | POA: Insufficient documentation

## 2021-02-28 DIAGNOSIS — E119 Type 2 diabetes mellitus without complications: Secondary | ICD-10-CM | POA: Insufficient documentation

## 2021-02-28 DIAGNOSIS — H8110 Benign paroxysmal vertigo, unspecified ear: Secondary | ICD-10-CM

## 2021-02-28 DIAGNOSIS — K5900 Constipation, unspecified: Secondary | ICD-10-CM | POA: Insufficient documentation

## 2021-02-28 DIAGNOSIS — R1319 Other dysphagia: Secondary | ICD-10-CM | POA: Insufficient documentation

## 2021-02-28 DIAGNOSIS — J300 Vasomotor rhinitis: Secondary | ICD-10-CM | POA: Insufficient documentation

## 2021-02-28 DIAGNOSIS — I129 Hypertensive chronic kidney disease with stage 1 through stage 4 chronic kidney disease, or unspecified chronic kidney disease: Secondary | ICD-10-CM | POA: Insufficient documentation

## 2021-02-28 DIAGNOSIS — E785 Hyperlipidemia, unspecified: Secondary | ICD-10-CM | POA: Insufficient documentation

## 2021-02-28 DIAGNOSIS — H3323 Serous retinal detachment, bilateral: Secondary | ICD-10-CM | POA: Diagnosis not present

## 2021-02-28 DIAGNOSIS — E1169 Type 2 diabetes mellitus with other specified complication: Secondary | ICD-10-CM | POA: Insufficient documentation

## 2021-02-28 DIAGNOSIS — G501 Atypical facial pain: Secondary | ICD-10-CM | POA: Diagnosis not present

## 2021-02-28 DIAGNOSIS — Z86010 Personal history of colonic polyps: Secondary | ICD-10-CM | POA: Insufficient documentation

## 2021-02-28 DIAGNOSIS — E559 Vitamin D deficiency, unspecified: Secondary | ICD-10-CM | POA: Insufficient documentation

## 2021-02-28 DIAGNOSIS — M810 Age-related osteoporosis without current pathological fracture: Secondary | ICD-10-CM | POA: Insufficient documentation

## 2021-02-28 DIAGNOSIS — F339 Major depressive disorder, recurrent, unspecified: Secondary | ICD-10-CM | POA: Insufficient documentation

## 2021-02-28 DIAGNOSIS — N3281 Overactive bladder: Secondary | ICD-10-CM | POA: Insufficient documentation

## 2021-02-28 DIAGNOSIS — Z96642 Presence of left artificial hip joint: Secondary | ICD-10-CM | POA: Insufficient documentation

## 2021-02-28 DIAGNOSIS — H5440 Blindness, one eye, unspecified eye: Secondary | ICD-10-CM | POA: Insufficient documentation

## 2021-02-28 DIAGNOSIS — J309 Allergic rhinitis, unspecified: Secondary | ICD-10-CM | POA: Insufficient documentation

## 2021-02-28 MED ORDER — LEVETIRACETAM 500 MG PO TABS: 500.0000 mg | ORAL_TABLET | Freq: Two times a day (BID) | ORAL | 3 refills | Status: DC

## 2021-02-28 MED ORDER — CARBAMAZEPINE 200 MG PO TABS: ORAL_TABLET | ORAL | 3 refills | Status: DC

## 2021-02-28 NOTE — Patient Instructions (Signed)
Great to see you Check labs today Let me know about therapy  Continue current medications See you back in 6 months
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
## 2021-03-01 DIAGNOSIS — K219 Gastro-esophageal reflux disease without esophagitis: Secondary | ICD-10-CM | POA: Diagnosis not present

## 2021-03-01 DIAGNOSIS — E785 Hyperlipidemia, unspecified: Secondary | ICD-10-CM | POA: Diagnosis not present

## 2021-03-01 DIAGNOSIS — E1169 Type 2 diabetes mellitus with other specified complication: Secondary | ICD-10-CM | POA: Diagnosis not present

## 2021-03-01 DIAGNOSIS — M810 Age-related osteoporosis without current pathological fracture: Secondary | ICD-10-CM | POA: Diagnosis not present

## 2021-03-01 DIAGNOSIS — N183 Chronic kidney disease, stage 3 unspecified: Secondary | ICD-10-CM | POA: Diagnosis not present

## 2021-03-01 DIAGNOSIS — I129 Hypertensive chronic kidney disease with stage 1 through stage 4 chronic kidney disease, or unspecified chronic kidney disease: Secondary | ICD-10-CM | POA: Diagnosis not present

## 2021-03-01 LAB — BASIC METABOLIC PANEL
BUN/Creatinine Ratio: 8 — ABNORMAL LOW (ref 12–28)
BUN: 9 mg/dL (ref 8–27)
CO2: 22 mmol/L (ref 20–29)
Calcium: 9.5 mg/dL (ref 8.7–10.3)
Chloride: 104 mmol/L (ref 96–106)
Creatinine, Ser: 1.16 mg/dL — ABNORMAL HIGH (ref 0.57–1.00)
Glucose: 99 mg/dL (ref 70–99)
Potassium: 4.3 mmol/L (ref 3.5–5.2)
Sodium: 141 mmol/L (ref 134–144)
eGFR: 53 mL/min/{1.73_m2} — ABNORMAL LOW (ref >59)

## 2021-03-01 LAB — CARBAMAZEPINE LEVEL, TOTAL: Carbamazepine (Tegretol), S: 8.8 ug/mL (ref 4.0–12.0)

## 2021-03-02 ENCOUNTER — Other Ambulatory Visit: Payer: Self-pay

## 2021-03-02 ENCOUNTER — Telehealth: Payer: Self-pay | Admitting: *Deleted

## 2021-03-02 ENCOUNTER — Ambulatory Visit: Payer: Medicare Other | Admitting: Allergy & Immunology

## 2021-03-02 NOTE — Telephone Encounter (Signed)
I called the patient. She verbalized understanding of the lab findings.

## 2021-03-02 NOTE — Telephone Encounter (Signed)
-----   Message from Suzzanne Cloud, NP sent at 03/01/2021  8:27 PM EST ----- Please call the patient. Labs look good, carbamazepine level was therapeutic. Creatinine is stable slightly elevated 1.16, sodium level is WNL at 141. Continue with current medications. Call if any questions!

## 2021-03-08 DIAGNOSIS — S52501D Unspecified fracture of the lower end of right radius, subsequent encounter for closed fracture with routine healing: Secondary | ICD-10-CM | POA: Diagnosis not present

## 2021-03-30 ENCOUNTER — Ambulatory Visit: Payer: Medicare Other | Admitting: Allergy & Immunology

## 2021-03-31 DIAGNOSIS — E1169 Type 2 diabetes mellitus with other specified complication: Secondary | ICD-10-CM | POA: Diagnosis not present

## 2021-03-31 DIAGNOSIS — I129 Hypertensive chronic kidney disease with stage 1 through stage 4 chronic kidney disease, or unspecified chronic kidney disease: Secondary | ICD-10-CM | POA: Diagnosis not present

## 2021-03-31 DIAGNOSIS — N183 Chronic kidney disease, stage 3 unspecified: Secondary | ICD-10-CM | POA: Diagnosis not present

## 2021-03-31 DIAGNOSIS — M810 Age-related osteoporosis without current pathological fracture: Secondary | ICD-10-CM | POA: Diagnosis not present

## 2021-03-31 DIAGNOSIS — E785 Hyperlipidemia, unspecified: Secondary | ICD-10-CM | POA: Diagnosis not present

## 2021-03-31 DIAGNOSIS — R296 Repeated falls: Secondary | ICD-10-CM | POA: Diagnosis not present

## 2021-03-31 DIAGNOSIS — Z7984 Long term (current) use of oral hypoglycemic drugs: Secondary | ICD-10-CM | POA: Diagnosis not present

## 2021-04-05 DIAGNOSIS — S52501D Unspecified fracture of the lower end of right radius, subsequent encounter for closed fracture with routine healing: Secondary | ICD-10-CM | POA: Diagnosis not present

## 2021-04-24 DIAGNOSIS — M810 Age-related osteoporosis without current pathological fracture: Secondary | ICD-10-CM | POA: Diagnosis not present

## 2021-05-03 DIAGNOSIS — S52501D Unspecified fracture of the lower end of right radius, subsequent encounter for closed fracture with routine healing: Secondary | ICD-10-CM | POA: Diagnosis not present

## 2021-05-17 DIAGNOSIS — Z780 Asymptomatic menopausal state: Secondary | ICD-10-CM | POA: Diagnosis not present

## 2021-05-17 DIAGNOSIS — M8588 Other specified disorders of bone density and structure, other site: Secondary | ICD-10-CM | POA: Diagnosis not present

## 2021-05-23 DIAGNOSIS — Z1231 Encounter for screening mammogram for malignant neoplasm of breast: Secondary | ICD-10-CM | POA: Diagnosis not present

## 2021-06-05 DIAGNOSIS — E785 Hyperlipidemia, unspecified: Secondary | ICD-10-CM | POA: Diagnosis not present

## 2021-06-05 DIAGNOSIS — E119 Type 2 diabetes mellitus without complications: Secondary | ICD-10-CM | POA: Diagnosis not present

## 2021-06-05 DIAGNOSIS — I208 Other forms of angina pectoris: Secondary | ICD-10-CM | POA: Diagnosis not present

## 2021-06-05 DIAGNOSIS — I10 Essential (primary) hypertension: Secondary | ICD-10-CM | POA: Diagnosis not present

## 2021-07-13 DIAGNOSIS — N183 Chronic kidney disease, stage 3 unspecified: Secondary | ICD-10-CM | POA: Diagnosis not present

## 2021-07-13 DIAGNOSIS — E785 Hyperlipidemia, unspecified: Secondary | ICD-10-CM | POA: Diagnosis not present

## 2021-07-13 DIAGNOSIS — I129 Hypertensive chronic kidney disease with stage 1 through stage 4 chronic kidney disease, or unspecified chronic kidney disease: Secondary | ICD-10-CM | POA: Diagnosis not present

## 2021-07-13 DIAGNOSIS — E1169 Type 2 diabetes mellitus with other specified complication: Secondary | ICD-10-CM | POA: Diagnosis not present

## 2021-07-13 DIAGNOSIS — K219 Gastro-esophageal reflux disease without esophagitis: Secondary | ICD-10-CM | POA: Diagnosis not present

## 2021-08-16 DIAGNOSIS — I129 Hypertensive chronic kidney disease with stage 1 through stage 4 chronic kidney disease, or unspecified chronic kidney disease: Secondary | ICD-10-CM | POA: Diagnosis not present

## 2021-08-16 DIAGNOSIS — E785 Hyperlipidemia, unspecified: Secondary | ICD-10-CM | POA: Diagnosis not present

## 2021-08-16 DIAGNOSIS — K219 Gastro-esophageal reflux disease without esophagitis: Secondary | ICD-10-CM | POA: Diagnosis not present

## 2021-08-16 DIAGNOSIS — E1169 Type 2 diabetes mellitus with other specified complication: Secondary | ICD-10-CM | POA: Diagnosis not present

## 2021-08-28 DIAGNOSIS — Z0289 Encounter for other administrative examinations: Secondary | ICD-10-CM

## 2021-08-31 ENCOUNTER — Telehealth: Payer: Self-pay

## 2021-08-31 NOTE — Telephone Encounter (Signed)
Received paperwork from [employer]. Bringing to Medical Records for processing.

## 2021-09-06 ENCOUNTER — Encounter: Payer: Self-pay | Admitting: Neurology

## 2021-09-06 ENCOUNTER — Ambulatory Visit: Payer: Medicare Other | Admitting: Neurology

## 2021-09-06 VITALS — BP 117/73 | HR 82 | Ht 65.0 in | Wt 206.0 lb

## 2021-09-06 DIAGNOSIS — G501 Atypical facial pain: Secondary | ICD-10-CM

## 2021-09-06 DIAGNOSIS — H5440 Blindness, one eye, unspecified eye: Secondary | ICD-10-CM

## 2021-09-06 MED ORDER — CARBAMAZEPINE 200 MG PO TABS: ORAL_TABLET | ORAL | 3 refills | Status: DC

## 2021-09-06 MED ORDER — LEVETIRACETAM 500 MG PO TABS
500.0000 mg | ORAL_TABLET | Freq: Two times a day (BID) | ORAL | 3 refills | Status: DC
Start: 2021-09-06 — End: 2021-10-06
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
## 2021-09-06 NOTE — Patient Instructions (Signed)
Meds ordered this encounter  Medications   levETIRAcetam (KEPPRA) 500 MG tablet    Sig: Take 1 tablet (500 mg total) by mouth 2 (two) times daily.    Dispense:  180 tablet    Refill:  3   carbamazepine (TEGRETOL) 200 MG tablet    Sig: Take 2 in the morning, take 1 in the evening    Dispense:  270 tablet    Refill:  3   Call for any worsening symptoms

## 2021-09-27 DIAGNOSIS — R410 Disorientation, unspecified: Secondary | ICD-10-CM | POA: Diagnosis not present

## 2021-09-27 DIAGNOSIS — R42 Dizziness and giddiness: Secondary | ICD-10-CM | POA: Diagnosis not present

## 2021-09-27 DIAGNOSIS — N183 Chronic kidney disease, stage 3 unspecified: Secondary | ICD-10-CM | POA: Diagnosis not present

## 2021-09-27 DIAGNOSIS — H532 Diplopia: Secondary | ICD-10-CM | POA: Diagnosis not present

## 2021-09-27 DIAGNOSIS — I129 Hypertensive chronic kidney disease with stage 1 through stage 4 chronic kidney disease, or unspecified chronic kidney disease: Secondary | ICD-10-CM | POA: Diagnosis not present

## 2021-09-27 DIAGNOSIS — D649 Anemia, unspecified: Secondary | ICD-10-CM | POA: Diagnosis not present

## 2021-10-06 ENCOUNTER — Telehealth: Payer: Self-pay | Admitting: Neurology

## 2021-10-06 MED ORDER — LEVETIRACETAM 500 MG PO TABS: 500.0000 mg | ORAL_TABLET | Freq: Two times a day (BID) | ORAL | 3 refills | Status: DC

## 2021-10-06 NOTE — Telephone Encounter (Signed)
Pt is calling and requesting a refill on levETIRAcetam (KEPPRA) 500 MG tablet. Pt is requesting perception be sent to [facility]

## 2021-10-06 NOTE — Telephone Encounter (Signed)
Rx sent to [facility].

## 2021-10-09 DIAGNOSIS — N183 Chronic kidney disease, stage 3 unspecified: Secondary | ICD-10-CM | POA: Diagnosis not present

## 2021-10-09 DIAGNOSIS — E1169 Type 2 diabetes mellitus with other specified complication: Secondary | ICD-10-CM | POA: Diagnosis not present

## 2021-10-09 DIAGNOSIS — K219 Gastro-esophageal reflux disease without esophagitis: Secondary | ICD-10-CM | POA: Diagnosis not present

## 2021-10-09 DIAGNOSIS — E785 Hyperlipidemia, unspecified: Secondary | ICD-10-CM | POA: Diagnosis not present

## 2021-10-09 DIAGNOSIS — I129 Hypertensive chronic kidney disease with stage 1 through stage 4 chronic kidney disease, or unspecified chronic kidney disease: Secondary | ICD-10-CM | POA: Diagnosis not present

## 2021-10-29 ENCOUNTER — Emergency Department (HOSPITAL_BASED_OUTPATIENT_CLINIC_OR_DEPARTMENT_OTHER): Payer: Medicare Other

## 2021-10-29 ENCOUNTER — Encounter (HOSPITAL_COMMUNITY): Payer: Self-pay

## 2021-10-29 ENCOUNTER — Observation Stay (HOSPITAL_BASED_OUTPATIENT_CLINIC_OR_DEPARTMENT_OTHER)
Admission: EM | Admit: 2021-10-29 | Discharge: 2021-10-31 | Disposition: A | Discharge status: Home / Self Care | Payer: Medicare Other | Attending: Internal Medicine | Admitting: Internal Medicine

## 2021-10-29 ENCOUNTER — Emergency Department (HOSPITAL_BASED_OUTPATIENT_CLINIC_OR_DEPARTMENT_OTHER): Payer: Medicare Other | Admitting: Radiology

## 2021-10-29 DIAGNOSIS — I10 Essential (primary) hypertension: Secondary | ICD-10-CM | POA: Diagnosis present

## 2021-10-29 DIAGNOSIS — E1122 Type 2 diabetes mellitus with diabetic chronic kidney disease: Secondary | ICD-10-CM | POA: Diagnosis not present

## 2021-10-29 DIAGNOSIS — R079 Chest pain, unspecified: Secondary | ICD-10-CM | POA: Diagnosis present

## 2021-10-29 DIAGNOSIS — N1831 Chronic kidney disease, stage 3a: Secondary | ICD-10-CM | POA: Insufficient documentation

## 2021-10-29 DIAGNOSIS — R778 Other specified abnormalities of plasma proteins: Secondary | ICD-10-CM | POA: Diagnosis not present

## 2021-10-29 DIAGNOSIS — Z6839 Body mass index (BMI) 39.0-39.9, adult: Secondary | ICD-10-CM | POA: Insufficient documentation

## 2021-10-29 DIAGNOSIS — R911 Solitary pulmonary nodule: Secondary | ICD-10-CM | POA: Diagnosis not present

## 2021-10-29 DIAGNOSIS — I129 Hypertensive chronic kidney disease with stage 1 through stage 4 chronic kidney disease, or unspecified chronic kidney disease: Secondary | ICD-10-CM | POA: Insufficient documentation

## 2021-10-29 DIAGNOSIS — E669 Obesity, unspecified: Secondary | ICD-10-CM | POA: Diagnosis not present

## 2021-10-29 DIAGNOSIS — Z96642 Presence of left artificial hip joint: Secondary | ICD-10-CM | POA: Diagnosis not present

## 2021-10-29 DIAGNOSIS — R0781 Pleurodynia: Principal | ICD-10-CM | POA: Insufficient documentation

## 2021-10-29 DIAGNOSIS — Z7982 Long term (current) use of aspirin: Secondary | ICD-10-CM | POA: Diagnosis not present

## 2021-10-29 DIAGNOSIS — Z79899 Other long term (current) drug therapy: Secondary | ICD-10-CM | POA: Insufficient documentation

## 2021-10-29 DIAGNOSIS — J9811 Atelectasis: Secondary | ICD-10-CM | POA: Diagnosis not present

## 2021-10-29 DIAGNOSIS — I2510 Atherosclerotic heart disease of native coronary artery without angina pectoris: Secondary | ICD-10-CM | POA: Insufficient documentation

## 2021-10-29 DIAGNOSIS — K449 Diaphragmatic hernia without obstruction or gangrene: Secondary | ICD-10-CM | POA: Diagnosis not present

## 2021-10-29 DIAGNOSIS — F339 Major depressive disorder, recurrent, unspecified: Secondary | ICD-10-CM | POA: Diagnosis present

## 2021-10-29 DIAGNOSIS — R0789 Other chest pain: Secondary | ICD-10-CM | POA: Diagnosis not present

## 2021-10-29 LAB — BASIC METABOLIC PANEL
Anion gap: 9 (ref 5–15)
BUN: 19 mg/dL (ref 8–23)
CO2: 27 mmol/L (ref 22–32)
Calcium: 9.5 mg/dL (ref 8.9–10.3)
Chloride: 104 mmol/L (ref 98–111)
Creatinine, Ser: 1.4 mg/dL — ABNORMAL HIGH (ref 0.44–1.00)
GFR, Estimated: 42 mL/min — ABNORMAL LOW (ref >60)
Glucose, Bld: 107 mg/dL — ABNORMAL HIGH (ref 70–99)
Potassium: 3.8 mmol/L (ref 3.5–5.1)
Sodium: 140 mmol/L (ref 135–145)

## 2021-10-29 LAB — CBC
HCT: 34.4 % — ABNORMAL LOW (ref 36.0–46.0)
Hemoglobin: 11.1 g/dL — ABNORMAL LOW (ref 12.0–15.0)
MCH: 30.2 pg (ref 26.0–34.0)
MCHC: 32.3 g/dL (ref 30.0–36.0)
MCV: 93.5 fL (ref 80.0–100.0)
Platelets: 199 10*3/uL (ref 150–400)
RBC: 3.68 MIL/uL — ABNORMAL LOW (ref 3.87–5.11)
RDW: 12.6 % (ref 11.5–15.5)
WBC: 5.6 10*3/uL (ref 4.0–10.5)
nRBC: 0 % (ref 0.0–0.2)

## 2021-10-29 LAB — TROPONIN I (HIGH SENSITIVITY)
Troponin I (High Sensitivity): 29 ng/L — ABNORMAL HIGH (ref <18)
Troponin I (High Sensitivity): 23 ng/L — ABNORMAL HIGH (ref <18)

## 2021-10-29 LAB — D-DIMER, QUANTITATIVE: D-Dimer, Quant: 0.79 ug/mL-FEU — ABNORMAL HIGH (ref 0.00–0.50)

## 2021-10-29 LAB — CBG MONITORING, ED: Glucose-Capillary: 106 mg/dL — ABNORMAL HIGH (ref 70–99)

## 2021-10-29 MED ORDER — IOHEXOL 350 MG/ML SOLN
100.0000 mL | Freq: Once | INTRAVENOUS | Status: AC | PRN
  Administered 2021-10-29: 61 mL via INTRAVENOUS

## 2021-10-29 MED ORDER — ASPIRIN 81 MG PO CHEW
324.0000 mg | CHEWABLE_TABLET | Freq: Once | ORAL | Status: AC
  Administered 2021-10-29: 324 mg via ORAL
  Filled 2021-10-29: qty 4

## 2021-10-29 MED ORDER — ACETAMINOPHEN 500 MG PO TABS
1000.0000 mg | ORAL_TABLET | Freq: Once | ORAL | Status: AC
  Administered 2021-10-29: 1000 mg via ORAL
  Filled 2021-10-29: qty 2

## 2021-10-29 MED ORDER — FENTANYL CITRATE PF 50 MCG/ML IJ SOSY
50.0000 ug | PREFILLED_SYRINGE | Freq: Once | INTRAMUSCULAR | Status: AC
  Administered 2021-10-29: 50 ug via INTRAVENOUS
  Filled 2021-10-29: qty 1

## 2021-10-29 MED ORDER — ALPRAZOLAM 0.5 MG PO TABS
0.2500 mg | ORAL_TABLET | Freq: Once | ORAL | Status: AC
  Administered 2021-10-29: 0.25 mg via ORAL
  Filled 2021-10-29: qty 1

## 2021-10-29 NOTE — ED Notes (Signed)
Patient transported to CT 

## 2021-10-29 NOTE — Discharge Instructions (Signed)
He will need repeat CT scan ordered by primary doctor to monitor right-sided pulmonary nodule.

## 2021-10-29 NOTE — ED Provider Notes (Signed)
[facility] EMERGENCY DEPT Provider Note   CSN: [id number] Arrival date & time: 10/29/21  0741     History {Add pertinent medical, surgical, social history, OB history to HPI:1} Chief Complaint  Patient presents with   Chest Pain    Summer [name] is a 66 y.o. female.  Patient presents with intermittent right upper chest pain worse at times with movement and deep breath.  No anterior or left-sided symptoms.  This is different than previous.  No injuries recalled.  No blood clot history.  No known cardiac or lung disease history.  Patient denies significant fever cough or shortness of breath.  Patient does have history of obesity, hyperlipidemia and high blood pressure.       Home Medications Prior to Admission medications   Medication Sig Start Date End Date Taking? Authorizing Provider  ALPRAZolam (XANAX) 0.25 MG tablet Take 0.125-0.25 mg by mouth daily as needed for anxiety.    [provider]  amLODipine (NORVASC) 2.5 MG tablet Take 2.5 mg by mouth every evening.    [provider]  aspirin EC 81 MG tablet Take 81 mg by mouth in the morning. Swallow whole.    [provider]  atorvastatin (LIPITOR) 40 MG tablet Take 40 mg by mouth every evening. 03/20/19   [provider]  carbamazepine (TEGRETOL) 200 MG tablet Take 2 in the morning, take 1 in the evening 09/06/21   Suzzanne Cloud, NP  carvedilol (COREG) 6.25 MG tablet Take 6.25-12.5 mg by mouth See admin instructions. Take 2 tablets (12.5 mg) by mouth in the morning and take 1 tablet (6.25 mg) by mouth at night. 12/01/20   [provider]  Cholecalciferol (VITAMIN D3) 1000 units CAPS Take 1,000 Units by mouth daily.    [provider]  Coral Calcium 1000 (390 Ca) MG TABS Take 1,000 mg by mouth daily.    [provider]  desvenlafaxine (PRISTIQ) 50 MG 24 hr tablet Take 50 mg by mouth in the morning. 02/04/20   [provider]  EPINEPHrine 0.3  mg/0.3 mL IJ SOAJ injection Inject 0.3 mg into the muscle as needed for anaphylaxis. 09/19/20   [provider]  fluticasone (FLONASE) 50 MCG/ACT nasal spray 1 spray daily as needed for allergies. 07/06/19   [provider]  ipratropium (ATROVENT) 0.03 % nasal spray Place 2 sprays into both nostrils 2 (two) times daily as needed (allergies).  04/29/19   [provider]  levETIRAcetam (KEPPRA) 500 MG tablet Take 1 tablet (500 mg total) by mouth 2 (two) times daily. 10/06/21   Suzzanne Cloud, NP  nitroGLYCERIN (NITROSTAT) 0.4 MG SL tablet Place 0.4 mg under the tongue every 5 (five) minutes x 3 doses as needed for chest pain.    [provider]  ondansetron (ZOFRAN) 4 MG tablet Take 1 tablet (4 mg total) by mouth every 8 (eight) hours as needed for nausea or vomiting. 01/24/21   Merlene Pulling K, PA-C  pantoprazole (PROTONIX) 40 MG tablet Take 40 mg by mouth daily as needed (indigestion). 03/20/19   [provider]  RYBELSUS 3 MG TABS Take 3 mg by mouth every morning. 11/29/20   [provider]  tiZANidine (ZANAFLEX) 2 MG tablet Take 2 mg by mouth 2 (two) times daily as needed for muscle spasms. 01/18/20   [provider]  triamcinolone ointment (KENALOG) 0.1 % Apply 1 application topically 2 (two) times daily. Patient taking differently: Apply 1 application  topically 2 (two) times daily  as needed (skin rash/irritation.). 10/18/20   Valentina Shaggy, MD      Allergies    Cinnamon, Meloxicam, Tape, Chlorhexidine, and Gabapentin    Review of Systems   Review of Systems  Constitutional:  Negative for chills and fever.  HENT:  Negative for congestion.   Eyes:  Negative for visual disturbance.  Respiratory:  Negative for shortness of breath.   Cardiovascular:  Positive for chest pain. Negative for leg swelling.  Gastrointestinal:  Negative for abdominal pain and vomiting.  Genitourinary:  Negative for dysuria and flank pain.   Musculoskeletal:  Negative for back pain, neck pain and neck stiffness.  Skin:  Negative for rash.  Neurological:  Negative for light-headedness and headaches.    Physical Exam Updated Vital Signs BP 138/73   Pulse 74   Temp 98.2 F (36.8 C) (Oral)   Resp 18   SpO2 97%  Physical Exam Vitals and nursing note reviewed.  Constitutional:      General: She is not in acute distress.    Appearance: She is well-developed.  HENT:     Head: Normocephalic and atraumatic.     Mouth/Throat:     Mouth: Mucous membranes are moist.  Eyes:     General:        Right eye: No discharge.        Left eye: No discharge.     Conjunctiva/sclera: Conjunctivae normal.  Neck:     Trachea: No tracheal deviation.  Cardiovascular:     Rate and Rhythm: Normal rate and regular rhythm.     Heart sounds: No murmur heard. Pulmonary:     Effort: Pulmonary effort is normal.     Breath sounds: Normal breath sounds.  Abdominal:     General: There is no distension.     Palpations: Abdomen is soft.     Tenderness: There is no abdominal tenderness. There is no guarding.  Musculoskeletal:        General: Normal range of motion.     Cervical back: Normal range of motion and neck supple. No rigidity.     Right lower leg: No edema.     Left lower leg: No edema.     Comments: Patient does have mild tenderness to palpation of right proximal pectoralis region without rash.  Skin:    General: Skin is warm.     Capillary Refill: Capillary refill takes less than 2 seconds.     Findings: No rash.  Neurological:     General: No focal deficit present.     Mental Status: She is alert.     Cranial Nerves: No cranial nerve deficit.  Psychiatric:        Mood and Affect: Mood normal.     ED Results / Procedures / Treatments   Labs (all labs ordered are listed, but only abnormal results are displayed) Labs Reviewed  BASIC METABOLIC PANEL - Abnormal; Notable for the following components:      Result Value    Glucose, Bld 107 (*)    Creatinine, Ser 1.40 (*)    GFR, Estimated 42 (*)    All other components within normal limits  CBC - Abnormal; Notable for the following components:   RBC 3.68 (*)    Hemoglobin 11.1 (*)    HCT 34.4 (*)    All other components within normal limits  D-DIMER, QUANTITATIVE - Abnormal; Notable for the following components:   D-Dimer, Quant 0.79 (*)    All other components within normal  limits  TROPONIN I (HIGH SENSITIVITY) - Abnormal; Notable for the following components:   Troponin I (High Sensitivity) 29 (*)    All other components within normal limits    EKG EKG Interpretation  Date/Time:  Sunday October 29 2021 07:57:51 EDT Ventricular Rate:  70 PR Interval:  162 QRS Duration: 96 QT Interval:  407 QTC Calculation: 440 R Axis:   20 Text Interpretation: Sinus rhythm  Similar to Nov 07/2020 ekg Confirmed by Elnora Morrison [phone]) on 10/29/2021 8:11:10 AM  Radiology DG Chest 2 View  Result Date: 10/29/2021 CLINICAL DATA:  Acute chest pain EXAM: CHEST - 2 VIEW COMPARISON:  12/25/2020 and prior radiographs FINDINGS: The cardiomediastinal silhouette is unremarkable. There is no evidence of focal airspace disease, pulmonary edema, suspicious pulmonary nodule/mass, pleural effusion, or pneumothorax. No acute bony abnormalities are identified. Remote rib fractures and ORIF RIGHT clavicle fracture noted. IMPRESSION: No active cardiopulmonary disease. Electronically Signed   By: Margarette Canada M.D.   On: 10/29/2021 09:00    Procedures Procedures  {Document cardiac monitor, telemetry assessment procedure when appropriate:1}  Medications Ordered in ED Medications  acetaminophen (TYLENOL) tablet 1,000 mg (1,000 mg Oral Given 10/29/21 7591)    ED Course/ Medical Decision Making/ A&P                           Medical Decision Making Amount and/or Complexity of Data Reviewed Labs: ordered. Radiology: ordered.  Risk OTC drugs.   Patient presents with  intermittent now fairly constant right upper chest/pleuritic discomfort.  Differential includes musculoskeletal with mild reproducibility, no evidence of zoster rash, patient does not have infectious symptoms so unlikely pneumonia or pleural effusion, no known cancer classic pulm embolism history is a low risk but could be pulmonary embolism so D-dimer sent, atypical cardiac troponin sent, other.  General blood work ordered reviewed creatinine mild elevated 1.4, D-dimer mild elevated 0.79 CT angiogram ordered and hemoglobin stable 11.1 without active bleeding.  Patient given Tylenol for pain and monitored.  EKG reviewed similar to previous.  {Document critical care time when appropriate:1} {Document review of labs and clinical decision tools ie heart score, Chads2Vasc2 etc:1}  {Document your independent review of radiology images, and any outside records:1} {Document your discussion with family members, caretakers, and with consultants:1} {Document social determinants of health affecting pt's care:1} {Document your decision making why or why not admission, treatments were needed:1} Final Clinical Impression(s) / ED Diagnoses Final diagnoses:  Pleuritic chest pain  Troponin level elevated    Rx / DC Orders ED Discharge Orders     None

## 2021-10-30 ENCOUNTER — Other Ambulatory Visit: Payer: Self-pay

## 2021-10-30 ENCOUNTER — Encounter (HOSPITAL_COMMUNITY): Payer: Self-pay | Admitting: Internal Medicine

## 2021-10-30 DIAGNOSIS — E1122 Type 2 diabetes mellitus with diabetic chronic kidney disease: Secondary | ICD-10-CM | POA: Diagnosis present

## 2021-10-30 DIAGNOSIS — N183 Chronic kidney disease, stage 3 unspecified: Secondary | ICD-10-CM

## 2021-10-30 DIAGNOSIS — R079 Chest pain, unspecified: Secondary | ICD-10-CM

## 2021-10-30 DIAGNOSIS — I129 Hypertensive chronic kidney disease with stage 1 through stage 4 chronic kidney disease, or unspecified chronic kidney disease: Secondary | ICD-10-CM | POA: Diagnosis not present

## 2021-10-30 DIAGNOSIS — Z79899 Other long term (current) drug therapy: Secondary | ICD-10-CM | POA: Diagnosis not present

## 2021-10-30 DIAGNOSIS — Z7982 Long term (current) use of aspirin: Secondary | ICD-10-CM | POA: Diagnosis not present

## 2021-10-30 DIAGNOSIS — E669 Obesity, unspecified: Secondary | ICD-10-CM | POA: Diagnosis not present

## 2021-10-30 DIAGNOSIS — R0781 Pleurodynia: Secondary | ICD-10-CM | POA: Diagnosis not present

## 2021-10-30 DIAGNOSIS — Z6839 Body mass index (BMI) 39.0-39.9, adult: Secondary | ICD-10-CM | POA: Diagnosis not present

## 2021-10-30 DIAGNOSIS — I10 Essential (primary) hypertension: Secondary | ICD-10-CM | POA: Diagnosis not present

## 2021-10-30 DIAGNOSIS — I2510 Atherosclerotic heart disease of native coronary artery without angina pectoris: Secondary | ICD-10-CM | POA: Diagnosis not present

## 2021-10-30 DIAGNOSIS — Z96642 Presence of left artificial hip joint: Secondary | ICD-10-CM | POA: Diagnosis not present

## 2021-10-30 DIAGNOSIS — R911 Solitary pulmonary nodule: Secondary | ICD-10-CM | POA: Diagnosis not present

## 2021-10-30 DIAGNOSIS — Z8249 Family history of ischemic heart disease and other diseases of the circulatory system: Secondary | ICD-10-CM | POA: Diagnosis not present

## 2021-10-30 DIAGNOSIS — E119 Type 2 diabetes mellitus without complications: Secondary | ICD-10-CM | POA: Diagnosis not present

## 2021-10-30 DIAGNOSIS — R778 Other specified abnormalities of plasma proteins: Secondary | ICD-10-CM | POA: Diagnosis not present

## 2021-10-30 DIAGNOSIS — N1831 Chronic kidney disease, stage 3a: Secondary | ICD-10-CM | POA: Diagnosis not present

## 2021-10-30 LAB — HEMOGLOBIN A1C
Hgb A1c MFr Bld: 6 % — ABNORMAL HIGH (ref 4.8–5.6)
Mean Plasma Glucose: 125.5 mg/dL

## 2021-10-30 LAB — HIV ANTIBODY (ROUTINE TESTING W REFLEX): HIV Screen 4th Generation wRfx: NONREACTIVE

## 2021-10-30 LAB — GLUCOSE, CAPILLARY
Glucose-Capillary: 112 mg/dL — ABNORMAL HIGH (ref 70–99)
Glucose-Capillary: 106 mg/dL — ABNORMAL HIGH (ref 70–99)

## 2021-10-30 LAB — TROPONIN I (HIGH SENSITIVITY)
Troponin I (High Sensitivity): 24 ng/L — ABNORMAL HIGH (ref <18)
Troponin I (High Sensitivity): 18 ng/L — ABNORMAL HIGH (ref <18)

## 2021-10-30 LAB — CBG MONITORING, ED: Glucose-Capillary: 92 mg/dL (ref 70–99)

## 2021-10-30 MED ORDER — ONDANSETRON HCL 4 MG/2ML IJ SOLN
4.0000 mg | Freq: Four times a day (QID) | INTRAMUSCULAR | Status: DC | PRN
  Administered 2021-10-31: 4 mg via INTRAVENOUS
  Filled 2021-10-30: qty 2

## 2021-10-30 MED ORDER — NITROGLYCERIN 0.4 MG SL SUBL
0.4000 mg | SUBLINGUAL_TABLET | SUBLINGUAL | Status: DC | PRN
Start: 2021-10-30 — End: 2021-10-31

## 2021-10-30 MED ORDER — ALPRAZOLAM 0.25 MG PO TABS: 0.1250 mg | ORAL_TABLET | Freq: Every day | ORAL | Status: DC | PRN

## 2021-10-30 MED ORDER — AMLODIPINE BESYLATE 2.5 MG PO TABS
2.5000 mg | ORAL_TABLET | Freq: Every day | ORAL | Status: DC
  Administered 2021-10-30 – 2021-10-31 (×2): 2.5 mg via ORAL
  Filled 2021-10-30 (×2): qty 1

## 2021-10-30 MED ORDER — INSULIN ASPART 100 UNIT/ML IJ SOLN: 0.0000 [IU] | Freq: Three times a day (TID) | INTRAMUSCULAR | Status: DC

## 2021-10-30 MED ORDER — LEVETIRACETAM 500 MG PO TABS
500.0000 mg | ORAL_TABLET | Freq: Two times a day (BID) | ORAL | Status: DC
  Administered 2021-10-30 – 2021-10-31 (×3): 500 mg via ORAL
  Filled 2021-10-30 (×3): qty 1

## 2021-10-30 MED ORDER — ACETAMINOPHEN 325 MG PO TABS
650.0000 mg | ORAL_TABLET | ORAL | Status: DC | PRN
  Filled 2021-10-30: qty 2

## 2021-10-30 MED ORDER — PANTOPRAZOLE SODIUM 40 MG PO TBEC: 40.0000 mg | DELAYED_RELEASE_TABLET | Freq: Every day | ORAL | Status: DC | PRN

## 2021-10-30 MED ORDER — VENLAFAXINE HCL ER 75 MG PO CP24
75.0000 mg | ORAL_CAPSULE | Freq: Every day | ORAL | Status: DC
  Administered 2021-10-31: 75 mg via ORAL
  Filled 2021-10-30: qty 1

## 2021-10-30 MED ORDER — KETOROLAC TROMETHAMINE 15 MG/ML IJ SOLN
15.0000 mg | Freq: Once | INTRAMUSCULAR | Status: AC
  Administered 2021-10-30: 15 mg via INTRAVENOUS
  Filled 2021-10-30: qty 1

## 2021-10-30 MED ORDER — CARBAMAZEPINE 200 MG PO TABS
200.0000 mg | ORAL_TABLET | Freq: Two times a day (BID) | ORAL | Status: DC
  Administered 2021-10-30 – 2021-10-31 (×3): 200 mg via ORAL
  Filled 2021-10-30 (×4): qty 1

## 2021-10-30 MED ORDER — TIZANIDINE HCL 2 MG PO TABS
2.0000 mg | ORAL_TABLET | Freq: Two times a day (BID) | ORAL | Status: DC | PRN
Start: 2021-10-30 — End: 2021-10-31

## 2021-10-30 MED ORDER — ACETAMINOPHEN 325 MG PO TABS
650.0000 mg | ORAL_TABLET | Freq: Once | ORAL | Status: AC
  Administered 2021-10-30: 650 mg via ORAL
  Filled 2021-10-30: qty 2

## 2021-10-30 MED ORDER — VITAMIN D3 25 MCG (1000 UNIT) PO TABS
1000.0000 [IU] | ORAL_TABLET | Freq: Every day | ORAL | Status: DC
  Administered 2021-10-30: 1000 [IU] via ORAL
  Filled 2021-10-30 (×3): qty 1

## 2021-10-30 MED ORDER — ASPIRIN 81 MG PO TBEC
81.0000 mg | DELAYED_RELEASE_TABLET | Freq: Every morning | ORAL | Status: DC
  Administered 2021-10-31: 81 mg via ORAL
  Filled 2021-10-30: qty 1

## 2021-10-30 MED ORDER — ENOXAPARIN SODIUM 40 MG/0.4ML IJ SOSY
40.0000 mg | PREFILLED_SYRINGE | INTRAMUSCULAR | Status: DC
  Administered 2021-10-30: 40 mg via SUBCUTANEOUS
  Filled 2021-10-30: qty 0.4

## 2021-10-30 MED ORDER — CARVEDILOL 6.25 MG PO TABS: 6.2500 mg | ORAL_TABLET | Freq: Two times a day (BID) | ORAL | Status: DC

## 2021-10-30 MED ORDER — ATORVASTATIN CALCIUM 40 MG PO TABS
40.0000 mg | ORAL_TABLET | Freq: Every evening | ORAL | Status: DC
  Administered 2021-10-30: 40 mg via ORAL
  Filled 2021-10-30: qty 1

## 2021-10-30 MED ORDER — METOPROLOL TARTRATE 25 MG PO TABS
25.0000 mg | ORAL_TABLET | Freq: Two times a day (BID) | ORAL | Status: DC
  Administered 2021-10-30 – 2021-10-31 (×3): 25 mg via ORAL
  Filled 2021-10-30 (×3): qty 1

## 2021-10-30 NOTE — Assessment & Plan Note (Signed)
Stable.  Continue Effexor. 
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
## 2021-10-30 NOTE — Assessment & Plan Note (Signed)
Patient presents for evaluation of right-sided chest pain with radiation to the mid sternum. Risk factors for coronary artery disease include her age, diabetes mellitus and hypertension Cardiology consult for evaluation for stress test Continue aspirin, carvedilol and atorvastatin

## 2021-10-30 NOTE — Assessment & Plan Note (Signed)
Continue carvedilol 
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
## 2021-10-30 NOTE — ED Notes (Signed)
Report given to [facility]. 

## 2021-10-30 NOTE — Assessment & Plan Note (Signed)
BMI 74.08 Complicates overall prognosis and care

## 2021-10-30 NOTE — ED Notes (Signed)
CBG 92 reported to Margurite Auerbach RN

## 2021-10-30 NOTE — Assessment & Plan Note (Signed)
Patient has type 2 diabetes mellitus with complications of stage III chronic kidney disease Hold oral hypoglycemic agents Place patient on a consistent carbohydrate diet Sliding scale insulin for glycemic control

## 2021-10-31 ENCOUNTER — Observation Stay (HOSPITAL_COMMUNITY): Payer: Medicare Other

## 2021-10-31 DIAGNOSIS — N1831 Chronic kidney disease, stage 3a: Secondary | ICD-10-CM | POA: Diagnosis not present

## 2021-10-31 DIAGNOSIS — Z96642 Presence of left artificial hip joint: Secondary | ICD-10-CM | POA: Diagnosis not present

## 2021-10-31 DIAGNOSIS — Z79899 Other long term (current) drug therapy: Secondary | ICD-10-CM | POA: Diagnosis not present

## 2021-10-31 DIAGNOSIS — E669 Obesity, unspecified: Secondary | ICD-10-CM | POA: Diagnosis not present

## 2021-10-31 DIAGNOSIS — Z8249 Family history of ischemic heart disease and other diseases of the circulatory system: Secondary | ICD-10-CM | POA: Diagnosis not present

## 2021-10-31 DIAGNOSIS — I2510 Atherosclerotic heart disease of native coronary artery without angina pectoris: Secondary | ICD-10-CM | POA: Diagnosis not present

## 2021-10-31 DIAGNOSIS — I129 Hypertensive chronic kidney disease with stage 1 through stage 4 chronic kidney disease, or unspecified chronic kidney disease: Secondary | ICD-10-CM | POA: Diagnosis not present

## 2021-10-31 DIAGNOSIS — R911 Solitary pulmonary nodule: Secondary | ICD-10-CM | POA: Diagnosis not present

## 2021-10-31 DIAGNOSIS — R0781 Pleurodynia: Secondary | ICD-10-CM | POA: Diagnosis not present

## 2021-10-31 DIAGNOSIS — I10 Essential (primary) hypertension: Secondary | ICD-10-CM | POA: Diagnosis not present

## 2021-10-31 DIAGNOSIS — R079 Chest pain, unspecified: Secondary | ICD-10-CM | POA: Diagnosis not present

## 2021-10-31 DIAGNOSIS — E119 Type 2 diabetes mellitus without complications: Secondary | ICD-10-CM | POA: Diagnosis not present

## 2021-10-31 DIAGNOSIS — R778 Other specified abnormalities of plasma proteins: Secondary | ICD-10-CM | POA: Diagnosis not present

## 2021-10-31 DIAGNOSIS — E1122 Type 2 diabetes mellitus with diabetic chronic kidney disease: Secondary | ICD-10-CM | POA: Diagnosis not present

## 2021-10-31 DIAGNOSIS — Z7982 Long term (current) use of aspirin: Secondary | ICD-10-CM | POA: Diagnosis not present

## 2021-10-31 LAB — LIPID PANEL
Cholesterol: 187 mg/dL (ref 0–200)
HDL: 76 mg/dL (ref >40)
LDL Cholesterol: 103 mg/dL — ABNORMAL HIGH (ref 0–99)
Total CHOL/HDL Ratio: 2.5 RATIO
Triglycerides: 41 mg/dL (ref <150)
VLDL: 8 mg/dL (ref 0–40)

## 2021-10-31 LAB — GLUCOSE, CAPILLARY
Glucose-Capillary: 85 mg/dL (ref 70–99)
Glucose-Capillary: 121 mg/dL — ABNORMAL HIGH (ref 70–99)

## 2021-10-31 LAB — BASIC METABOLIC PANEL
Anion gap: 9 (ref 5–15)
BUN: 23 mg/dL (ref 8–23)
CO2: 24 mmol/L (ref 22–32)
Calcium: 9.5 mg/dL (ref 8.9–10.3)
Chloride: 105 mmol/L (ref 98–111)
Creatinine, Ser: 1.43 mg/dL — ABNORMAL HIGH (ref 0.44–1.00)
GFR, Estimated: 41 mL/min — ABNORMAL LOW (ref >60)
Glucose, Bld: 107 mg/dL — ABNORMAL HIGH (ref 70–99)
Potassium: 4 mmol/L (ref 3.5–5.1)
Sodium: 138 mmol/L (ref 135–145)

## 2021-10-31 MED ORDER — TECHNETIUM TC 99M TETROFOSMIN IV KIT
10.2000 | PACK | Freq: Once | INTRAVENOUS | Status: AC | PRN
  Administered 2021-10-31: 10.2 via INTRAVENOUS

## 2021-10-31 MED ORDER — METOPROLOL TARTRATE 25 MG PO TABS: 25.0000 mg | ORAL_TABLET | Freq: Two times a day (BID) | ORAL | 1 refills | Status: DC

## 2021-10-31 MED ORDER — REGADENOSON 0.4 MG/5ML IV SOLN: 0.4000 mg | Freq: Once | INTRAVENOUS | Status: AC

## 2021-10-31 MED ORDER — REGADENOSON 0.4 MG/5ML IV SOLN
INTRAVENOUS | Status: AC
  Administered 2021-10-31: 0.4 mg via INTRAVENOUS
  Filled 2021-10-31: qty 5

## 2021-10-31 MED ORDER — TECHNETIUM TC 99M TETROFOSMIN IV KIT
30.4000 | PACK | Freq: Once | INTRAVENOUS | Status: AC | PRN
  Administered 2021-10-31: 30.4 via INTRAVENOUS

## 2021-10-31 MED ORDER — AMLODIPINE BESYLATE 2.5 MG PO TABS: 2.5000 mg | ORAL_TABLET | Freq: Every day | ORAL | 1 refills | Status: DC

## 2021-10-31 NOTE — Discharge Summary (Addendum)
Physician Discharge Summary   Patient: Summer [name] MRN: [id number] DOB: 19-May-1955  Admit date:     10/29/2021  Discharge date: 10/31/21  Discharge Physician: Elmarie Shiley   PCP: Harlan Stains, MD   Recommendations at discharge:    Follow up with Dr Terrence Dupont in 1 week.  Further risk factors modifications.  Follow up pulmonary nodule.  If pain reoccur consider LFT, lipase.   Discharge Diagnoses: Principal Problem:   Chest pain Active Problems:   Obesity (BMI 30-39.9)   Hypertension   Recurrent major depression ([place])   CKD stage 3 due to type 2 diabetes mellitus ([place])  Resolved Problems:   * No resolved hospital problems. *  Hospital Course: 66 year old with past medical history significant for depression, anxiety, left eye blindness, diabetes type 2, hypertension, dyslipidemia, GERD presenting with 1 week history of intermittent pain under her right breast radiating across her chest to the mid sternum.  6 out of 10 in intensity.  Cardiology was consulted, patient underwent stress test which showed low risk.  No reversible ischemia   Assessment and Plan: 1-Chest pain: Troponin 29--23 D dimer: 0.79 CTA: Negative for PE Chest pain resolved. Stress test low risk no wall motion abnormality.  Discussed with cardiology, patient to follow-up in the office in 1 week.  Obesity: BMI 33   CKD stage IIIA; 1.1---1.3 We will increase creatinine.  Stable since yesterday.   Diabetes type 2: Continue with [place].  Recurrent major depression: Continue with Effexor     Hypertension: started on metoprolol and low dose Norvasc.     4 mm left solid pulmonary nodule within the upper lobe. Follow-up with PCP for further evaluation might need CT scan in 43-month          Consultants: Cardiology  Procedures performed: Stress test  Disposition: Home Diet recommendation:  Carb modified diet DISCHARGE MEDICATION: Allergies as of 10/31/2021       Reactions    Cinnamon    Other reaction(s): lip/throat swelling   Meloxicam    Other reaction(s): dizzy   Tape Other (See Comments)   Plastic tape only.  REACTION:  Skin redness.   Chlorhexidine Itching   (Irritation) Skin redness   Gabapentin Anxiety        Medication List     STOP taking these medications    carvedilol 6.25 MG tablet Commonly known as: COREG   pantoprazole 40 MG tablet Commonly known as: PROTONIX   tiZANidine 2 MG tablet Commonly known as: ZANAFLEX       TAKE these medications    acetaminophen 500 MG tablet Commonly known as: TYLENOL Take 1,000 mg by mouth every 6 (six) hours as needed for mild pain.   ALPRAZolam 0.25 MG tablet Commonly known as: XANAX Take 0.125-0.25 mg by mouth daily as needed for anxiety.   amLODipine 2.5 MG tablet Commonly known as: NORVASC Take 1 tablet (2.5 mg total) by mouth daily. Start taking on: November 01, 2021   aspirin EC 81 MG tablet Take 81 mg by mouth in the morning. Swallow whole.   atorvastatin 40 MG tablet Commonly known as: LIPITOR Take 40 mg by mouth every evening.   carbamazepine 200 MG tablet Commonly known as: TEGRETOL Take 2 in the morning, take 1 in the evening What changed:  how much to take how to take this when to take this additional instructions   Coral Calcium 1000 (390 Ca) MG Tabs Take 1,000 mg by mouth daily.   desvenlafaxine 100 MG 24  hr tablet Commonly known as: PRISTIQ Take 100 mg by mouth in the morning.   EPINEPHrine 0.3 mg/0.3 mL Soaj injection Commonly known as: EPI-PEN Inject 0.3 mg into the muscle as needed for anaphylaxis.   Farxiga 5 MG Tabs tablet Generic drug: dapagliflozin propanediol Take 5 mg by mouth daily.   fluticasone 50 MCG/ACT nasal spray Commonly known as: FLONASE Place 1 spray into both nostrils daily as needed for allergies.   ipratropium 0.03 % nasal spray Commonly known as: ATROVENT Place 2 sprays into both nostrils 2 (two) times daily as needed  (allergies).   levETIRAcetam 500 MG tablet Commonly known as: KEPPRA Take 1 tablet (500 mg total) by mouth 2 (two) times daily.   metoprolol tartrate 25 MG tablet Commonly known as: LOPRESSOR Take 1 tablet (25 mg total) by mouth 2 (two) times daily.   nitroGLYCERIN 0.4 MG SL tablet Commonly known as: NITROSTAT Place 0.4 mg under the tongue every 5 (five) minutes x 3 doses as needed for chest pain.   ondansetron 4 MG tablet Commonly known as: Zofran Take 1 tablet (4 mg total) by mouth every 8 (eight) hours as needed for nausea or vomiting.   triamcinolone ointment 0.1 % Commonly known as: KENALOG Apply 1 application topically 2 (two) times daily. What changed:  when to take this reasons to take this   Vitamin D3 25 MCG (1000 UT) Caps Take 1,000 Units by mouth daily.        Follow-up Information     Harlan Stains, MD .   Specialty: Family Medicine Contact information: [place] [address] [phone]         Charolette Forward, MD Follow up in 1 week(s).   Specialty: Cardiology Contact information: [address] [place] [address] [phone]                Discharge Exam: Danley Danker Weights   10/30/21 1318  Weight: 92.7 kg   General; NAD  Condition at discharge: stable  The results of significant diagnostics from this hospitalization (including imaging, microbiology, ancillary and laboratory) are listed below for reference.   Imaging Studies: NM Myocar Multi W/Spect W/Wall Motion / EF  Result Date: 10/31/2021 CLINICAL DATA:  Chest pain.  Normal EKG and troponins. EXAM: MYOCARDIAL IMAGING WITH SPECT (REST AND PHARMACOLOGIC-STRESS) GATED LEFT VENTRICULAR WALL MOTION STUDY LEFT VENTRICULAR EJECTION FRACTION TECHNIQUE: Standard myocardial SPECT imaging was performed after resting intravenous injection of 10.2 mCi [id number]. Subsequently, intravenous infusion of Lexiscan was performed under the  supervision of the Cardiology staff. At peak effect of the drug, 30.4 mCi Tc-984m3/21/2022 was injected intravenously and standard myocardial SPECT imaging was performed. Quantitative gated imaging was also performed to evaluate left ventricular wall motion, and estimate left ventricular ejection fraction. COMPARISON:  None Available. FINDINGS: Perfusion: There is a small in size moderate in severity fixed defect involving the mid anterolateral and apicolateral segments. Wall Motion: Normal left ventricular wall motion. Mildly decreased thickening involving the mid anterolateral segment. No left ventricular dilation. Left Ventricular Ejection Fraction: 86 % End diastolic volume 78 ml End systolic volume 11 ml IMPRESSION: 1. No reversible ischemia. Small in size, moderate in severity, fixed defect involves the mid anterolateral and apicolateral segments. 2. Normal left ventricular wall motion. Mildly decreased thickening of the mid anterolateral segment. 3. Left ventricular ejection fraction 86% 4. Non invasive risk stratification*: Low *2012 Appropriate Use Criteria for Coronary Revascularization Focused Update: J Am Coll Cardiol. 2012;59(9):857-881.  [email or web address] Electronically Signed   By: Kerby Moors M.D.   On: 10/31/2021 13:19   CT Angio Chest PE W and/or Wo Contrast  Result Date: 10/29/2021 CLINICAL DATA:  Rule out pulmonary embolism. Right pleuritic chest pain. EXAM: CT ANGIOGRAPHY CHEST WITH CONTRAST TECHNIQUE: Multidetector CT imaging of the chest was performed using the standard protocol during bolus administration of intravenous contrast. Multiplanar CT image reconstructions and MIPs were obtained to evaluate the vascular anatomy. RADIATION DOSE REDUCTION: This exam was performed according to the departmental dose-optimization program which includes automated exposure control, adjustment of the mA and/or kV according to patient size and/or use of  iterative reconstruction technique. CONTRAST:  86m OMNIPAQUE IOHEXOL 350 MG/ML SOLN COMPARISON:  None Available. FINDINGS: Cardiovascular: Satisfactory opacification of the pulmonary arteries to the segmental level. No evidence of pulmonary embolism. Mild cardiac enlargement. Aortic atherosclerosis. Coronary artery calcifications. Mediastinum/Nodes: No enlarged mediastinal, hilar, or axillary lymph nodes. Thyroid gland, trachea, and esophagus demonstrate no significant findings. Lungs/Pleura: No pleural effusion, interstitial edema or airspace consolidation. Lungs are hypoinflated with dependent changes and subsegmental atelectasis in the posterior lung bases. Small nodule in the apical segment of the left upper lobe measures 4 mm, image 34/8. Upper Abdomen: Small hiatal hernia. No acute abnormality identified within the imaged portions of the upper abdomen. Low-density structure in left lobe of liver is too small to characterize measuring 9 x 0.6 cm. Musculoskeletal: No acute abnormality. Chronic appearing compression deformity involving the T8 vertebral body is noted with loss of approximately 60% of the vertebral body height. Review of the MIP images confirms the above findings. IMPRESSION: 1. No evidence for acute pulmonary embolism. 2. 4 mm left solid pulmonary nodule within the upper lobe. Per Fleischner Society Guidelines, if patient is low risk for malignancy, no routine follow-up imaging is recommended; if patient is high risk for malignancy, a non-contrast Chest CT at 12 months is optional. If performed and the nodule is stable at 12 months, no further follow-up is recommended. These guidelines do not apply to immunocompromised patients and patients with cancer. Follow up in patients with significant comorbidities as clinically warranted. For lung cancer screening, adhere to Lung-RADS guidelines. Reference: Radiology. 2017; 284(1):228-43. 3. Coronary artery calcifications. 4. Chronic appearing T8  compression deformity. 5. Aortic Atherosclerosis (ICD10-I70.0). Electronically Signed   By: TKerby MoorsM.D.   On: 10/29/2021 10:28   DG Chest 2 View  Result Date: 10/29/2021 CLINICAL DATA:  Acute chest pain EXAM: CHEST - 2 VIEW COMPARISON:  12/25/2020 and prior radiographs FINDINGS: The cardiomediastinal silhouette is unremarkable. There is no evidence of focal airspace disease, pulmonary edema, suspicious pulmonary nodule/mass, pleural effusion, or pneumothorax. No acute bony abnormalities are identified. Remote rib fractures and ORIF RIGHT clavicle fracture noted. IMPRESSION: No active cardiopulmonary disease. Electronically Signed   By: JMargarette CanadaM.D.   On: 10/29/2021 09:00    Microbiology: Results for orders placed or performed during the hospital encounter of 12/26/20  Resp Panel by RT-PCR (Flu A&B, Covid) Nasopharyngeal Swab     Status: None   Collection Time: 12/26/20  7:45 PM   Specimen: Nasopharyngeal Swab; Nasopharyngeal(NP) swabs in vial transport medium  Result Value Ref Range Status   SARS Coronavirus 2 by RT PCR NEGATIVE NEGATIVE Final    Comment: (NOTE) SARS-CoV-2 target nucleic acids are NOT DETECTED.  The SARS-CoV-2 RNA is generally detectable in upper respiratory specimens during the acute phase of infection. The lowest concentration of SARS-CoV-2 viral copies this assay can detect is 138 copies/mL.  A negative result does not preclude SARS-Cov-2 infection and should not be used as the sole basis for treatment or other patient management decisions. A negative result may occur with  improper specimen collection/handling, submission of specimen other than nasopharyngeal swab, presence of viral mutation(s) within the areas targeted by this assay, and inadequate number of viral copies(<138 copies/mL). A negative result must be combined with clinical observations, patient history, and epidemiological information. The expected result is Negative.  Fact Sheet for  Patients:  EntrepreneurPulse.com.au  Fact Sheet for Healthcare Providers:  IncredibleEmployment.be  This test is no t yet approved or cleared by the [place] FDA and  has been authorized for detection and/or diagnosis of SARS-CoV-2 by FDA under an Emergency Use Authorization (EUA). This EUA will remain  in effect (meaning this test can be used) for the duration of the COVID-19 declaration under Section 564(b)(1) of the Act, 21 U.S.C.section 360bbb-3(b)(1), unless the authorization is terminated  or revoked sooner.       Influenza A by PCR NEGATIVE NEGATIVE Final   Influenza B by PCR NEGATIVE NEGATIVE Final    Comment: (NOTE) The Xpert Xpress SARS-CoV-2/FLU/RSV plus assay is intended as an aid in the diagnosis of influenza from Nasopharyngeal swab specimens and should not be used as a sole basis for treatment. Nasal washings and aspirates are unacceptable for Xpert Xpress SARS-CoV-2/FLU/RSV testing.  Fact Sheet for Patients: EntrepreneurPulse.com.au  Fact Sheet for Healthcare Providers: IncredibleEmployment.be  This test is not yet approved or cleared by the [place] FDA and has been authorized for detection and/or diagnosis of SARS-CoV-2 by FDA under an Emergency Use Authorization (EUA). This EUA will remain in effect (meaning this test can be used) for the duration of the COVID-19 declaration under Section 564(b)(1) of the Act, 21 U.S.C. section 360bbb-3(b)(1), unless the authorization is terminated or revoked.  Performed at [facility], [address] [place], [place] [address]     Labs: CBC: Recent Labs  Lab 10/29/21 0816  WBC 5.6  HGB 11.1*  HCT 34.4*  MCV 93.5  PLT 440   Basic Metabolic Panel: Recent Labs  Lab 10/29/21 0816 10/31/21 0743  NA 140 138  K 3.8 4.0  CL 104 105  CO2 27 24  GLUCOSE 107* 107*  BUN 19 23  CREATININE 1.40* 1.43*  CALCIUM 9.5 9.5    Liver Function Tests: No results for input(s): "AST", "ALT", "ALKPHOS", "BILITOT", "PROT", "ALBUMIN" in the last 168 hours. CBG: Recent Labs  Lab 10/30/21 0739 10/30/21 1616 10/30/21 2158 10/31/21 0812 10/31/21 1227  GLUCAP 92 112* 106* 85 121*    Discharge time spent: greater than 30 minutes.  Signed: Elmarie Shiley, MD [facility] 10/31/2021

## 2021-10-31 NOTE — Final Progress Note (Signed)
Patient given discharge instructions and stated understanding. 

## 2021-10-31 NOTE — Progress Notes (Signed)
Mobility Specialist - Progress Note   10/31/21 1352  Mobility  Activity Ambulated with assistance in hallway  Level of Assistance Standby assist, set-up cues, supervision of patient - no hands on  Assistive Device Front wheel walker  Distance Ambulated (ft) 600 ft  Activity Response Tolerated well  $Mobility charge 1 Mobility   Pt was received in bed and agreeable to mobility. No c/o pain throughout ambulation. Pt was retuned EOB with all needs met.    Larey Seat

## 2021-10-31 NOTE — Progress Notes (Signed)
Subjective:  Patient denies any chest pain or shortness of breath.  Tolerated stress portion of the [employer].  No EKG changes were noted.  Objective:  Vital Signs in the last 24 hours: Temp:  [97.5 F (36.4 C)-98.5 F (36.9 C)] 97.5 F (36.4 C) (09/12 0417) Pulse Rate:  [65-113] 101 (09/12 1119) Resp:  [17-22] 20 (09/12 0417) BP: (120-179)/(43-68) 169/48 (09/12 1120) SpO2:  [93 %-100 %] 98 % (09/12 0417) Weight:  [92.7 kg] 92.7 kg (09/11 1318)  Intake/Output from previous day: No intake/output data recorded. Intake/Output from this shift: No intake/output data recorded.  Physical Exam: Exam unchanged  Lab Results: Recent Labs    10/29/21 0816  WBC 5.6  HGB 11.1*  PLT 199   Recent Labs    10/29/21 0816 10/31/21 0743  NA 140 138  K 3.8 4.0  CL 104 105  CO2 27 24  GLUCOSE 107* 107*  BUN 19 23  CREATININE 1.40* 1.43*   No results for input(s): "TROPONINI" in the last 72 hours.  Invalid input(s): "CK", "MB" Hepatic Function Panel No results for input(s): "PROT", "ALBUMIN", "AST", "ALT", "ALKPHOS", "BILITOT", "BILIDIR", "IBILI" in the last 72 hours. Recent Labs    10/31/21 0743  CHOL 187   No results for input(s): "PROTIME" in the last 72 hours.  Imaging: Imaging results have been reviewed and No results found.  Cardiac Studies:  Assessment/Plan:  Atypical chest pain with left arm pain with some features worrisome for angina rule out coronary insufficiency Uncontrolled hypertension Diabetes mellitus Hyperlipidemia Morbid obesity Depression Osteoarthritis  History of esophageal stricture History of retinal detachment/blindness Positive family history of coronary artery disease Plan Continue present management Check nuclear Myoview results if negative for ischemia okay to discharge from cardiac point of view  LOS: 0 days    Charolette Forward 10/31/2021, 11:27 AM

## 2021-10-31 NOTE — Plan of Care (Signed)
  Problem: Education: Goal: Knowledge of General Education information will improve Description: Including pain rating scale, medication(s)/side effects and non-pharmacologic comfort measures Outcome: Adequate for Discharge   Problem: Health Behavior/Discharge Planning: Goal: Ability to manage health-related needs will improve Outcome: Adequate for Discharge   Problem: Clinical Measurements: Goal: Ability to maintain clinical measurements within normal limits will improve Outcome: Adequate for Discharge Goal: Will remain free from infection Outcome: Adequate for Discharge Goal: Diagnostic test results will improve Outcome: Adequate for Discharge Goal: Respiratory complications will improve Outcome: Adequate for Discharge Goal: Cardiovascular complication will be avoided Outcome: Adequate for Discharge   Problem: Activity: Goal: Risk for activity intolerance will decrease Outcome: Adequate for Discharge   Problem: Nutrition: Goal: Adequate nutrition will be maintained Outcome: Adequate for Discharge   Problem: Coping: Goal: Level of anxiety will decrease Outcome: Adequate for Discharge   Problem: Elimination: Goal: Will not experience complications related to bowel motility Outcome: Adequate for Discharge Goal: Will not experience complications related to urinary retention Outcome: Adequate for Discharge   Problem: Pain Managment: Goal: General experience of comfort will improve Outcome: Adequate for Discharge   Problem: Safety: Goal: Ability to remain free from injury will improve Outcome: Adequate for Discharge   Problem: Skin Integrity: Goal: Risk for impaired skin integrity will decrease Outcome: Adequate for Discharge   Problem: Education: Goal: Understanding of cardiac disease, CV risk reduction, and recovery process will improve Outcome: Adequate for Discharge Goal: Individualized Educational Video(s) Outcome: Adequate for Discharge   Problem:  Activity: Goal: Ability to tolerate increased activity will improve Outcome: Adequate for Discharge   Problem: Cardiac: Goal: Ability to achieve and maintain adequate cardiovascular perfusion will improve Outcome: Adequate for Discharge   Problem: Health Behavior/Discharge Planning: Goal: Ability to safely manage health-related needs after discharge will improve Outcome: Adequate for Discharge   Problem: Education: Goal: Ability to describe self-care measures that may prevent or decrease complications (Diabetes Survival Skills Education) will improve Outcome: Adequate for Discharge Goal: Individualized Educational Video(s) Outcome: Adequate for Discharge   Problem: Coping: Goal: Ability to adjust to condition or change in health will improve Outcome: Adequate for Discharge   Problem: Fluid Volume: Goal: Ability to maintain a balanced intake and output will improve Outcome: Adequate for Discharge   Problem: Health Behavior/Discharge Planning: Goal: Ability to identify and utilize available resources and services will improve Outcome: Adequate for Discharge Goal: Ability to manage health-related needs will improve Outcome: Adequate for Discharge   Problem: Metabolic: Goal: Ability to maintain appropriate glucose levels will improve Outcome: Adequate for Discharge   Problem: Nutritional: Goal: Maintenance of adequate nutrition will improve Outcome: Adequate for Discharge Goal: Progress toward achieving an optimal weight will improve Outcome: Adequate for Discharge   Problem: Skin Integrity: Goal: Risk for impaired skin integrity will decrease Outcome: Adequate for Discharge   Problem: Tissue Perfusion: Goal: Adequacy of tissue perfusion will improve Outcome: Adequate for Discharge

## 2021-11-08 DIAGNOSIS — W19XXXA Unspecified fall, initial encounter: Secondary | ICD-10-CM | POA: Diagnosis not present

## 2021-11-08 DIAGNOSIS — I25118 Atherosclerotic heart disease of native coronary artery with other forms of angina pectoris: Secondary | ICD-10-CM | POA: Diagnosis not present

## 2021-11-08 DIAGNOSIS — I700 Atherosclerosis of aorta: Secondary | ICD-10-CM | POA: Diagnosis not present

## 2021-11-08 DIAGNOSIS — M25561 Pain in right knee: Secondary | ICD-10-CM | POA: Diagnosis not present

## 2021-11-08 DIAGNOSIS — R079 Chest pain, unspecified: Secondary | ICD-10-CM | POA: Diagnosis not present

## 2021-11-08 DIAGNOSIS — R911 Solitary pulmonary nodule: Secondary | ICD-10-CM | POA: Diagnosis not present

## 2021-11-08 DIAGNOSIS — L989 Disorder of the skin and subcutaneous tissue, unspecified: Secondary | ICD-10-CM | POA: Diagnosis not present

## 2021-11-08 DIAGNOSIS — Z09 Encounter for follow-up examination after completed treatment for conditions other than malignant neoplasm: Secondary | ICD-10-CM | POA: Diagnosis not present

## 2021-11-09 DIAGNOSIS — N183 Chronic kidney disease, stage 3 unspecified: Secondary | ICD-10-CM | POA: Diagnosis not present

## 2021-11-09 DIAGNOSIS — E1169 Type 2 diabetes mellitus with other specified complication: Secondary | ICD-10-CM | POA: Diagnosis not present

## 2021-11-09 DIAGNOSIS — K219 Gastro-esophageal reflux disease without esophagitis: Secondary | ICD-10-CM | POA: Diagnosis not present

## 2021-11-09 DIAGNOSIS — I129 Hypertensive chronic kidney disease with stage 1 through stage 4 chronic kidney disease, or unspecified chronic kidney disease: Secondary | ICD-10-CM | POA: Diagnosis not present

## 2021-11-10 DIAGNOSIS — E785 Hyperlipidemia, unspecified: Secondary | ICD-10-CM | POA: Diagnosis not present

## 2021-11-10 DIAGNOSIS — I10 Essential (primary) hypertension: Secondary | ICD-10-CM | POA: Diagnosis not present

## 2021-11-10 DIAGNOSIS — E119 Type 2 diabetes mellitus without complications: Secondary | ICD-10-CM | POA: Diagnosis not present

## 2021-11-10 DIAGNOSIS — I208 Other forms of angina pectoris: Secondary | ICD-10-CM | POA: Diagnosis not present

## 2021-12-05 DIAGNOSIS — E785 Hyperlipidemia, unspecified: Secondary | ICD-10-CM | POA: Diagnosis not present

## 2021-12-05 DIAGNOSIS — K219 Gastro-esophageal reflux disease without esophagitis: Secondary | ICD-10-CM | POA: Diagnosis not present

## 2021-12-05 DIAGNOSIS — I129 Hypertensive chronic kidney disease with stage 1 through stage 4 chronic kidney disease, or unspecified chronic kidney disease: Secondary | ICD-10-CM | POA: Diagnosis not present

## 2021-12-05 DIAGNOSIS — E1169 Type 2 diabetes mellitus with other specified complication: Secondary | ICD-10-CM | POA: Diagnosis not present

## 2021-12-05 DIAGNOSIS — N183 Chronic kidney disease, stage 3 unspecified: Secondary | ICD-10-CM | POA: Diagnosis not present

## 2021-12-26 DIAGNOSIS — Z961 Presence of intraocular lens: Secondary | ICD-10-CM | POA: Diagnosis not present

## 2021-12-26 DIAGNOSIS — H15002 Unspecified scleritis, left eye: Secondary | ICD-10-CM | POA: Diagnosis not present

## 2021-12-26 DIAGNOSIS — H3342 Traction detachment of retina, left eye: Secondary | ICD-10-CM | POA: Diagnosis not present

## 2021-12-26 DIAGNOSIS — E119 Type 2 diabetes mellitus without complications: Secondary | ICD-10-CM | POA: Diagnosis not present

## 2021-12-29 DIAGNOSIS — E785 Hyperlipidemia, unspecified: Secondary | ICD-10-CM | POA: Diagnosis not present

## 2021-12-29 DIAGNOSIS — N183 Chronic kidney disease, stage 3 unspecified: Secondary | ICD-10-CM | POA: Diagnosis not present

## 2021-12-29 DIAGNOSIS — E1169 Type 2 diabetes mellitus with other specified complication: Secondary | ICD-10-CM | POA: Diagnosis not present

## 2021-12-29 DIAGNOSIS — I129 Hypertensive chronic kidney disease with stage 1 through stage 4 chronic kidney disease, or unspecified chronic kidney disease: Secondary | ICD-10-CM | POA: Diagnosis not present

## 2022-01-31 ENCOUNTER — Ambulatory Visit
Admission: EM | Admit: 2022-01-31 | Discharge: 2022-01-31 | Disposition: A | Discharge status: Home / Self Care | Payer: Medicare Other | Attending: Nurse Practitioner | Admitting: Nurse Practitioner

## 2022-01-31 ENCOUNTER — Emergency Department (HOSPITAL_BASED_OUTPATIENT_CLINIC_OR_DEPARTMENT_OTHER): Payer: Medicare Other

## 2022-01-31 ENCOUNTER — Other Ambulatory Visit: Payer: Self-pay

## 2022-01-31 ENCOUNTER — Encounter (HOSPITAL_BASED_OUTPATIENT_CLINIC_OR_DEPARTMENT_OTHER): Payer: Self-pay | Admitting: Pediatrics

## 2022-01-31 ENCOUNTER — Emergency Department (HOSPITAL_BASED_OUTPATIENT_CLINIC_OR_DEPARTMENT_OTHER)
Admission: EM | Admit: 2022-01-31 | Discharge: 2022-01-31 | Disposition: A | Discharge status: Home / Self Care | Payer: Medicare Other | Attending: Emergency Medicine | Admitting: Emergency Medicine

## 2022-01-31 DIAGNOSIS — R1031 Right lower quadrant pain: Secondary | ICD-10-CM | POA: Diagnosis not present

## 2022-01-31 DIAGNOSIS — N390 Urinary tract infection, site not specified: Secondary | ICD-10-CM | POA: Insufficient documentation

## 2022-01-31 DIAGNOSIS — N189 Chronic kidney disease, unspecified: Secondary | ICD-10-CM | POA: Insufficient documentation

## 2022-01-31 DIAGNOSIS — I129 Hypertensive chronic kidney disease with stage 1 through stage 4 chronic kidney disease, or unspecified chronic kidney disease: Secondary | ICD-10-CM | POA: Insufficient documentation

## 2022-01-31 DIAGNOSIS — R1030 Lower abdominal pain, unspecified: Secondary | ICD-10-CM

## 2022-01-31 DIAGNOSIS — E1122 Type 2 diabetes mellitus with diabetic chronic kidney disease: Secondary | ICD-10-CM | POA: Diagnosis not present

## 2022-01-31 DIAGNOSIS — K7689 Other specified diseases of liver: Secondary | ICD-10-CM | POA: Diagnosis not present

## 2022-01-31 DIAGNOSIS — Z7984 Long term (current) use of oral hypoglycemic drugs: Secondary | ICD-10-CM | POA: Diagnosis not present

## 2022-01-31 DIAGNOSIS — Z79899 Other long term (current) drug therapy: Secondary | ICD-10-CM | POA: Insufficient documentation

## 2022-01-31 DIAGNOSIS — N2889 Other specified disorders of kidney and ureter: Secondary | ICD-10-CM | POA: Diagnosis not present

## 2022-01-31 DIAGNOSIS — K5730 Diverticulosis of large intestine without perforation or abscess without bleeding: Secondary | ICD-10-CM | POA: Diagnosis not present

## 2022-01-31 DIAGNOSIS — Z7982 Long term (current) use of aspirin: Secondary | ICD-10-CM | POA: Insufficient documentation

## 2022-01-31 LAB — URINALYSIS, ROUTINE W REFLEX MICROSCOPIC
Bilirubin Urine: NEGATIVE
Glucose, UA: >=500 mg/dL — AB
Hgb urine dipstick: NEGATIVE
Ketones, ur: NEGATIVE mg/dL
Leukocytes,Ua: NEGATIVE
Nitrite: POSITIVE — AB
Protein, ur: NEGATIVE mg/dL
Specific Gravity, Urine: 1.015 (ref 1.005–1.030)
pH: 5.5 (ref 5.0–8.0)

## 2022-01-31 LAB — POCT URINALYSIS DIP (MANUAL ENTRY)
Bilirubin, UA: NEGATIVE
Blood, UA: NEGATIVE
Glucose, UA: >=1000 mg/dL — AB
Ketones, POC UA: NEGATIVE mg/dL
Leukocytes, UA: NEGATIVE
Nitrite, UA: POSITIVE — AB
Protein Ur, POC: NEGATIVE mg/dL
Spec Grav, UA: 1.015 (ref 1.010–1.025)
Urobilinogen, UA: 0.2 E.U./dL
pH, UA: 5.5 (ref 5.0–8.0)

## 2022-01-31 LAB — CBC
HCT: 35.5 % — ABNORMAL LOW (ref 36.0–46.0)
Hemoglobin: 11.5 g/dL — ABNORMAL LOW (ref 12.0–15.0)
MCH: 29.4 pg (ref 26.0–34.0)
MCHC: 32.4 g/dL (ref 30.0–36.0)
MCV: 90.8 fL (ref 80.0–100.0)
Platelets: 185 10*3/uL (ref 150–400)
RBC: 3.91 MIL/uL (ref 3.87–5.11)
RDW: 13 % (ref 11.5–15.5)
WBC: 5.7 10*3/uL (ref 4.0–10.5)
nRBC: 0 % (ref 0.0–0.2)

## 2022-01-31 LAB — COMPREHENSIVE METABOLIC PANEL
ALT: 12 U/L (ref 0–44)
AST: 20 U/L (ref 15–41)
Albumin: 3.3 g/dL — ABNORMAL LOW (ref 3.5–5.0)
Alkaline Phosphatase: 129 U/L — ABNORMAL HIGH (ref 38–126)
Anion gap: 7 (ref 5–15)
BUN: 20 mg/dL (ref 8–23)
CO2: 25 mmol/L (ref 22–32)
Calcium: 8.9 mg/dL (ref 8.9–10.3)
Chloride: 104 mmol/L (ref 98–111)
Creatinine, Ser: 1.4 mg/dL — ABNORMAL HIGH (ref 0.44–1.00)
GFR, Estimated: 42 mL/min — ABNORMAL LOW (ref >60)
Glucose, Bld: 149 mg/dL — ABNORMAL HIGH (ref 70–99)
Potassium: 3.8 mmol/L (ref 3.5–5.1)
Sodium: 136 mmol/L (ref 135–145)
Total Bilirubin: 0.2 mg/dL — ABNORMAL LOW (ref 0.3–1.2)
Total Protein: 7.7 g/dL (ref 6.5–8.1)

## 2022-01-31 LAB — LACTIC ACID, PLASMA
Lactic Acid, Venous: 1.3 mmol/L (ref 0.5–1.9)
Lactic Acid, Venous: 1 mmol/L (ref 0.5–1.9)

## 2022-01-31 LAB — URINALYSIS, MICROSCOPIC (REFLEX)

## 2022-01-31 LAB — LIPASE, BLOOD: Lipase: 43 U/L (ref 11–51)

## 2022-01-31 MED ORDER — ACETAMINOPHEN 325 MG PO TABS
650.0000 mg | ORAL_TABLET | Freq: Once | ORAL | Status: AC
  Administered 2022-01-31: 650 mg via ORAL
  Filled 2022-01-31: qty 2

## 2022-01-31 MED ORDER — IOHEXOL 300 MG/ML  SOLN
80.0000 mL | Freq: Once | INTRAMUSCULAR | Status: AC | PRN
  Administered 2022-01-31: 80 mL via INTRAVENOUS

## 2022-01-31 MED ORDER — MORPHINE SULFATE (PF) 4 MG/ML IV SOLN
4.0000 mg | Freq: Once | INTRAVENOUS | Status: DC
  Filled 2022-01-31: qty 1

## 2022-01-31 MED ORDER — CEPHALEXIN 500 MG PO CAPS: 500.0000 mg | ORAL_CAPSULE | Freq: Two times a day (BID) | ORAL | 0 refills | Status: AC

## 2022-01-31 NOTE — Discharge Instructions (Signed)
Your history, exam, workup today did not show evidence of appendicitis, diverticulitis, or obstruction but did show evidence of bladder wall inflammation and urinalysis concerning for urinary tract infection.  This fits with the lower abdominal discomfort across the entire lower abdomen that you are experiencing.  The rest of your workup was overall reassuring as we discussed.  We do feel you are safe for discharge home however please follow-up with your PCP and to rest and stay hydrated.  Please take the antibiotics for the next week.  If any symptoms change or worsen acutely, please return to the nearest emergency department.
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
## 2022-01-31 NOTE — ED Provider Notes (Signed)
[facility] EMERGENCY DEPARTMENT Provider Note   CSN: [id number] Arrival date & time: 01/31/22  1608     History  Chief Complaint  Patient presents with   Abdominal Pain    Summer [name] is a 66 y.o. female.  The history is provided by the patient and medical records. No language interpreter was used.  Abdominal Pain Pain location:  RUQ and RLQ Pain quality: aching, cramping and sharp   Pain radiates to:  RLQ, LLQ and suprapubic region Pain severity:  Severe Onset quality:  Gradual Duration:  2 days Timing:  Constant Progression:  Worsening Chronicity:  New Relieved by:  Nothing Worsened by:  Palpation Ineffective treatments:  None tried Associated symptoms: no chest pain, no chills, no constipation, no cough, no diarrhea, no dysuria, no fatigue, no fever, no nausea, no shortness of breath, no vaginal bleeding, no vaginal discharge and no vomiting        Home Medications Prior to Admission medications   Medication Sig Start Date End Date Taking? Authorizing Provider  acetaminophen (TYLENOL) 500 MG tablet Take 1,000 mg by mouth every 6 (six) hours as needed for mild pain.    [provider]  ALPRAZolam Duanne Moron) 0.25 MG tablet Take 0.125-0.25 mg by mouth daily as needed for anxiety.    [provider]  amLODipine (NORVASC) 2.5 MG tablet Take 1 tablet (2.5 mg total) by mouth daily. 11/01/21   Regalado, Jerald Kief A, MD  aspirin EC 81 MG tablet Take 81 mg by mouth in the morning. Swallow whole.    [provider]  atorvastatin (LIPITOR) 40 MG tablet Take 40 mg by mouth every evening. 03/20/19   [provider]  carbamazepine (TEGRETOL) 200 MG tablet Take 2 in the morning, take 1 in the evening Patient taking differently: Take 100-200 mg by mouth See admin instructions. Take 2 tabs ( 200 mg) in the morning, take 1 tab ( 100 mg) in the evening 09/06/21   Suzzanne Cloud, NP  Cholecalciferol (VITAMIN D3) 1000 units CAPS Take 1,000 Units  by mouth daily.    [provider]  Coral Calcium 1000 (390 Ca) MG TABS Take 1,000 mg by mouth daily.    [provider]  dapagliflozin propanediol (FARXIGA) 5 MG TABS tablet Take 5 mg by mouth daily.    [provider]  desvenlafaxine (PRISTIQ) 100 MG 24 hr tablet Take 100 mg by mouth in the morning. 02/04/20   [provider]  EPINEPHrine 0.3 mg/0.3 mL IJ SOAJ injection Inject 0.3 mg into the muscle as needed for anaphylaxis. 09/19/20   [provider]  fluticasone (FLONASE) 50 MCG/ACT nasal spray Place 1 spray into both nostrils daily as needed for allergies. 07/06/19   [provider]  ipratropium (ATROVENT) 0.03 % nasal spray Place 2 sprays into both nostrils 2 (two) times daily as needed (allergies).  04/29/19   [provider]  levETIRAcetam (KEPPRA) 500 MG tablet Take 1 tablet (500 mg total) by mouth 2 (two) times daily. 10/06/21   Suzzanne Cloud, NP  metoprolol tartrate (LOPRESSOR) 25 MG tablet Take 1 tablet (25 mg total) by mouth 2 (two) times daily. 10/31/21   Regalado, Belkys A, MD  nitroGLYCERIN (NITROSTAT) 0.4 MG SL tablet Place 0.4 mg under the tongue every 5 (five) minutes x 3 doses as needed for chest pain.    [provider]  ondansetron (ZOFRAN) 4 MG tablet Take 1 tablet (4 mg total) by mouth every 8 (eight) hours as  needed for nausea or vomiting. 01/24/21   Merlene Pulling K, PA-C  triamcinolone ointment (KENALOG) 0.1 % Apply 1 application topically 2 (two) times daily. Patient taking differently: Apply 1 application  topically 2 (two) times daily as needed (skin rash/irritation.). 10/18/20   Valentina Shaggy, MD      Allergies    Cinnamon, Meloxicam, Tape, Chlorhexidine, and Gabapentin    Review of Systems   Review of Systems  Constitutional:  Negative for chills, fatigue and fever.  HENT:  Negative for congestion.   Respiratory:  Negative for cough, chest tightness, shortness of breath and wheezing.    Cardiovascular:  Negative for chest pain, palpitations and leg swelling.  Gastrointestinal:  Positive for abdominal pain. Negative for abdominal distention, constipation, diarrhea, nausea and vomiting.  Genitourinary:  Negative for dysuria, flank pain, frequency, vaginal bleeding and vaginal discharge.  Musculoskeletal:  Negative for back pain, myalgias, neck pain and neck stiffness.  Skin:  Negative for rash and wound.  Neurological:  Negative for dizziness, light-headedness and headaches.  Psychiatric/Behavioral:  Negative for agitation and confusion.   All other systems reviewed and are negative.   Physical Exam Updated Vital Signs BP (!) 117/55 (BP Location: Right Arm)   Pulse 69   Temp 98.2 F (36.8 C) (Oral)   Resp 16   Ht 5' 5.5" (1.664 m)   Wt 94.3 kg   SpO2 98%   BMI 34.09 kg/m  Physical Exam Vitals and nursing note reviewed.  Constitutional:      General: She is not in acute distress.    Appearance: She is well-developed. She is not ill-appearing, toxic-appearing or diaphoretic.  HENT:     Head: Normocephalic and atraumatic.     Mouth/Throat:     Mouth: Mucous membranes are moist.  Eyes:     Conjunctiva/sclera: Conjunctivae normal.  Cardiovascular:     Rate and Rhythm: Normal rate and regular rhythm.     Heart sounds: No murmur heard. Pulmonary:     Effort: Pulmonary effort is normal. No respiratory distress.     Breath sounds: Normal breath sounds. No wheezing, rhonchi or rales.  Chest:     Chest wall: No tenderness.  Abdominal:     General: Abdomen is flat. Bowel sounds are normal. There is no distension.     Palpations: Abdomen is soft.     Tenderness: There is abdominal tenderness in the right lower quadrant, suprapubic area and left lower quadrant. There is no right CVA tenderness, left CVA tenderness, guarding or rebound.  Genitourinary:    Comments: Refused by patient when offered initially Musculoskeletal:        General: No swelling.     Cervical  back: Neck supple.  Skin:    General: Skin is warm and dry.     Capillary Refill: Capillary refill takes less than 2 seconds.  Neurological:     Mental Status: She is alert.  Psychiatric:        Mood and Affect: Mood normal.     ED Results / Procedures / Treatments   Labs (all labs ordered are listed, but only abnormal results are displayed) Labs Reviewed  COMPREHENSIVE METABOLIC PANEL - Abnormal; Notable for the following components:      Result Value   Glucose, Bld 149 (*)    Creatinine, Ser 1.40 (*)    Albumin 3.3 (*)    Alkaline Phosphatase 129 (*)    Total Bilirubin 0.2 (*)    GFR, Estimated 42 (*)  All other components within normal limits  CBC - Abnormal; Notable for the following components:   Hemoglobin 11.5 (*)    HCT 35.5 (*)    All other components within normal limits  URINALYSIS, ROUTINE W REFLEX MICROSCOPIC - Abnormal; Notable for the following components:   APPearance HAZY (*)    Glucose, UA >=500 (*)    Nitrite POSITIVE (*)    All other components within normal limits  URINALYSIS, MICROSCOPIC (REFLEX) - Abnormal; Notable for the following components:   Bacteria, UA MANY (*)    All other components within normal limits  URINE CULTURE  LIPASE, BLOOD  LACTIC ACID, PLASMA  LACTIC ACID, PLASMA    EKG None  Radiology CT ABDOMEN PELVIS W CONTRAST  Result Date: 01/31/2022 CLINICAL DATA:  Left lower quadrant abdominal pain. Right lower quadrant abdominal pain. Severe lower abdominal pain and tenderness. EXAM: CT ABDOMEN AND PELVIS WITH CONTRAST TECHNIQUE: Multidetector CT imaging of the abdomen and pelvis was performed using the standard protocol following bolus administration of intravenous contrast. RADIATION DOSE REDUCTION: This exam was performed according to the departmental dose-optimization program which includes automated exposure control, adjustment of the mA and/or kV according to patient size and/or use of iterative reconstruction technique.  CONTRAST:  51m OMNIPAQUE IOHEXOL 300 MG/ML  SOLN COMPARISON:  CT 12/26/2020 FINDINGS: Lower chest: Minimal scarring in the dependent left lower lobe. No acute airspace disease or pleural fluid. Hepatobiliary: Stable cyst in the left lobe of the liver. No suspicious liver lesion. Partially distended gallbladder without inflammation. No calcified gallstone. No biliary dilatation. Pancreas: No ductal dilatation or inflammation. Spleen: Normal in size without focal abnormality. Adrenals/Urinary Tract: Normal adrenal glands. No hydronephrosis or perinephric edema. Stable cortical scarring in the posterior left kidney. No renal calculi or suspicious renal lesion. There is mild wall thickening about the bladder dome. Stomach/Bowel: Small hiatal hernia. Ingested material in the stomach. Mild fecalization of distal small bowel contents. No small bowel obstruction or inflammation. Diminutive appendix tentatively visualized, regardless, no appendicitis. Moderate volume of colonic stool. Diverticulosis from the splenic flexure distally. No acute diverticulitis. No colonic wall thickening or focal colonic abnormalities. Vascular/Lymphatic: Mild aortic atherosclerosis. No aneurysm. Aortic branch vessels are patent. Patent portal, splenic and mesenteric veins. No abdominopelvic adenopathy. Reproductive: Status post hysterectomy. No adnexal masses. Other: No free air, free fluid, or intra-abdominal fluid collection. No abdominal wall hernia. Musculoskeletal: There are no acute or suspicious osseous abnormalities. Left hip arthroplasty. The bones are under mineralized. No muscular findings to explain pain. IMPRESSION: 1. Mild wall thickening about the bladder dome, can be seen with cystitis. Recommend correlation with urinalysis. 2. Colonic diverticulosis without diverticulitis. No evidence of appendicitis. 3. Moderate colonic stool burden with fecalization of distal small bowel contents, can be seen with slow transit. 4. Small  hiatal hernia. Aortic Atherosclerosis (ICD10-I70.0). Electronically Signed   By: MKeith RakeM.D.   On: 01/31/2022 18:32    Procedures Procedures    Medications Ordered in ED Medications  acetaminophen (TYLENOL) tablet 650 mg (650 mg Oral Given 01/31/22 1729)  iohexol (OMNIPAQUE) 300 MG/ML solution 80 mL (80 mLs Intravenous Contrast Given 01/31/22 1748)     ED Course/ Medical Decision Making/ A&P                           Medical Decision Making Amount and/or Complexity of Data Reviewed Labs: ordered. Radiology: ordered.  Risk OTC drugs. Prescription drug management.    Summer [name]  is a 66 y.o. female with a past medical history significant for hypertension, hyperlipidemia, left hip avascular necrosis status post arthroplasty, CKD, diabetes, depression, previous lumbar fracture, vertigo, previous hysterectomy, and GERD who presents with lower abdominal pain.  According to patient, last night while watching TV she starting pain across her lower abdomen.  Was gradual in nature but has progressed to over 10 out of 10 pain in severity.  She reports no nausea, vomiting, constipation, or diarrhea and denies any urinary symptoms.  She reports she has had a history of UTI and does not feel a thing like this before.  No trauma and no rashes reported to suggest shingles.  She went to urgent care today and due to the amount of tenderness in her lower abdomen they sent her here for evaluation and workup to rule out acute diverticulitis, perforation, appendicitis or other cause of the severe pain.  Patient reports no fevers or chills and denies any chest pain, shortness breath, or cough.  She has never had this type pain before.  She does not have a uterus and is denying any vaginal symptoms.  She reports no leg pain or leg swelling distally and denies any back or flank pain.  No history of kidney stones reported.  On exam, lungs clear and chest nontender abdomen is exquisitely tender  across her lower abdomen.  Bowel sounds are appreciated.  Good pulses in extremities.  Back nontender whatsoever.  Lungs clear.  Patient otherwise resting with not getting palpating the abdomen.  He reports her baseline pain is a 7 out of 10 but jumps up to 10 with any palpation.  Clinically I do feel need to get imaging to rule out appendicitis, diverticulitis, or other acute cause of the severe pain.  Given lack of pelvic pain, have low suspicion for an ovarian cause of symptoms at this time.  Patient was offered a pelvic exam and she refused initially.  She would like to get CT imaging and labs will give her some pain medicine.  Anticipate reassessment after workup to determine disposition.  CT scan returned showing evidence of bladder wall inflammation and urinalysis is concerning for UTI with nitrites and many bacteria and white cells.  Rest of workup overall reassuring and we went through with the patient.  No diverticulitis or appendicitis seen.  Patient is feeling better.  Will give prescription for antibiotics and she will be discharged after p.o. challenge for outpatient treatment of cystitis.  Her flanks and back are nontender, doubt pyelonephritis and otherwise patient is well-appearing.  Patient treated with plan and PCP follow-up.  She understood return precautions and follow-up instructions and was discharged in good condition.         Final Clinical Impression(s) / ED Diagnoses Final diagnoses:  Lower abdominal pain  Lower urinary tract infectious disease    Rx / DC Orders ED Discharge Orders          Ordered    cephALEXin (KEFLEX) 500 MG capsule  2 times daily        01/31/22 1920           Clinical Impression: 1. Lower abdominal pain   2. Lower urinary tract infectious disease     Disposition: Discharge  Condition: Good  I have discussed the results, Dx and Tx plan with the pt(& family if present). He/she/they expressed understanding and agree(s) with  the plan. Discharge instructions discussed at great length. Strict return precautions discussed and pt &/or family have verbalized understanding  of the instructions. No further questions at time of discharge.    New Prescriptions   CEPHALEXIN (KEFLEX) 500 MG CAPSULE    Take 1 capsule (500 mg total) by mouth 2 (two) times daily for 7 days.    Follow Up: [facility] Stains, [address] [place] [address] [phone]     [place] [address] [id number] QU HKIS [place]        Bowdy Bair, Gwenyth Allegra, MD 01/31/22 [place]

## 2022-01-31 NOTE — Discharge Instructions (Signed)
Please go to [facility] for further evaluation

## 2022-01-31 NOTE — ED Triage Notes (Addendum)
Pt c/o lower left abd pain that began last night. The patient states the pain is worse with movement.   home interventions: none

## 2022-01-31 NOTE — ED Notes (Signed)
Patient is being discharged from the Urgent Care and sent to the Emergency Department via POV with family. Per Rosario Adie NP, patient is in need of higher level of care due to need for further evaluation . Patient is aware and verbalizes understanding of plan of care.  Vitals:   01/31/22 1422  BP: (!) 155/83  Pulse: 68  Resp: 16  Temp: 98.6 F (37 C)  SpO2: 95%

## 2022-01-31 NOTE — ED Triage Notes (Signed)
Arrived POV accompanied by daughter from [place]; concern for diverticulitis or appendicitis. States abdominal pain on LLQ started yesterday; but not has tenderness of RLQ; pain is worst with movement also;

## 2022-02-01 ENCOUNTER — Ambulatory Visit: Payer: Self-pay

## 2022-02-01 DIAGNOSIS — E1169 Type 2 diabetes mellitus with other specified complication: Secondary | ICD-10-CM | POA: Diagnosis not present

## 2022-02-01 DIAGNOSIS — K219 Gastro-esophageal reflux disease without esophagitis: Secondary | ICD-10-CM | POA: Diagnosis not present

## 2022-02-01 DIAGNOSIS — I129 Hypertensive chronic kidney disease with stage 1 through stage 4 chronic kidney disease, or unspecified chronic kidney disease: Secondary | ICD-10-CM | POA: Diagnosis not present

## 2022-02-01 DIAGNOSIS — N183 Chronic kidney disease, stage 3 unspecified: Secondary | ICD-10-CM | POA: Diagnosis not present

## 2022-02-01 DIAGNOSIS — E785 Hyperlipidemia, unspecified: Secondary | ICD-10-CM | POA: Diagnosis not present

## 2022-02-02 LAB — URINE CULTURE: Culture: >=100000 — AB

## 2022-02-03 ENCOUNTER — Telehealth (HOSPITAL_BASED_OUTPATIENT_CLINIC_OR_DEPARTMENT_OTHER): Payer: Self-pay | Admitting: Emergency Medicine

## 2022-02-03 NOTE — Telephone Encounter (Signed)
Post ED Visit - Positive Culture Follow-up  Culture report reviewed by antimicrobial stewardship pharmacist: [place] Team '[]'$  Elenor Quinones, Pharm.D. '[]'$  Heide Guile, Pharm.D., BCPS AQ-ID '[]'$  Parks Neptune, Pharm.D., BCPS '[]'$  Alycia Rossetti, Pharm.D., BCPS '[]'$  Malone, Pharm.D., BCPS, AAHIVP '[]'$  Legrand Como, Pharm.D., BCPS, AAHIVP '[]'$  Salome Arnt, PharmD, BCPS '[]'$  Johnnette Gourd, PharmD, BCPS '[]'$  Hughes Better, PharmD, BCPS '[x]'$  Esmeralda Arthur, PharmD '[]'$  Laqueta Linden, PharmD, BCPS '[]'$  Albertina Parr, PharmD  [place] Team '[]'$  Leodis Sias, PharmD '[]'$  Lindell Spar, PharmD '[]'$  Royetta Asal, PharmD '[]'$  Graylin Shiver, Rph '[]'$  Rema Fendt) Glennon Mac, PharmD '[]'$  Arlyn Dunning, PharmD '[]'$  Netta Cedars, PharmD '[]'$  Dia Sitter, PharmD '[]'$  Leone Haven, PharmD '[]'$  Gretta Arab, PharmD '[]'$  Theodis Shove, PharmD '[]'$  Peggyann Juba, PharmD '[]'$  Reuel Boom, PharmD   Positive urine culture Treated with Cephalexin, organism sensitive to the same and no further patient follow-up is required at this time.  Summer [name] 02/03/2022, 11:44 PM

## 2022-02-07 DIAGNOSIS — N3000 Acute cystitis without hematuria: Secondary | ICD-10-CM | POA: Diagnosis not present

## 2022-02-07 DIAGNOSIS — H5440 Blindness, one eye, unspecified eye: Secondary | ICD-10-CM | POA: Diagnosis not present

## 2022-02-07 DIAGNOSIS — N183 Chronic kidney disease, stage 3 unspecified: Secondary | ICD-10-CM | POA: Diagnosis not present

## 2022-02-07 DIAGNOSIS — E1169 Type 2 diabetes mellitus with other specified complication: Secondary | ICD-10-CM | POA: Diagnosis not present

## 2022-02-07 DIAGNOSIS — E559 Vitamin D deficiency, unspecified: Secondary | ICD-10-CM | POA: Diagnosis not present

## 2022-02-07 DIAGNOSIS — M810 Age-related osteoporosis without current pathological fracture: Secondary | ICD-10-CM | POA: Diagnosis not present

## 2022-02-07 DIAGNOSIS — K219 Gastro-esophageal reflux disease without esophagitis: Secondary | ICD-10-CM | POA: Diagnosis not present

## 2022-02-07 DIAGNOSIS — E785 Hyperlipidemia, unspecified: Secondary | ICD-10-CM | POA: Diagnosis not present

## 2022-02-07 DIAGNOSIS — Z0000 Encounter for general adult medical examination without abnormal findings: Secondary | ICD-10-CM | POA: Diagnosis not present

## 2022-02-07 DIAGNOSIS — I129 Hypertensive chronic kidney disease with stage 1 through stage 4 chronic kidney disease, or unspecified chronic kidney disease: Secondary | ICD-10-CM | POA: Diagnosis not present

## 2022-02-26 DIAGNOSIS — E785 Hyperlipidemia, unspecified: Secondary | ICD-10-CM | POA: Diagnosis not present

## 2022-02-26 DIAGNOSIS — I10 Essential (primary) hypertension: Secondary | ICD-10-CM | POA: Diagnosis not present

## 2022-02-26 DIAGNOSIS — E559 Vitamin D deficiency, unspecified: Secondary | ICD-10-CM | POA: Diagnosis not present

## 2022-03-13 ENCOUNTER — Encounter: Payer: Self-pay | Admitting: Neurology

## 2022-03-13 ENCOUNTER — Ambulatory Visit: Payer: Medicare Other | Admitting: Neurology

## 2022-03-13 VITALS — BP 135/70 | HR 64 | Ht 65.0 in | Wt 208.0 lb

## 2022-03-13 DIAGNOSIS — G501 Atypical facial pain: Secondary | ICD-10-CM

## 2022-03-13 MED ORDER — CARBAMAZEPINE 200 MG PO TABS: ORAL_TABLET | ORAL | 3 refills | Status: DC

## 2022-03-13 MED ORDER — LEVETIRACETAM 500 MG PO TABS: 500.0000 mg | ORAL_TABLET | Freq: Two times a day (BID) | ORAL | 3 refills | Status: DC
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
## 2022-03-13 NOTE — Patient Instructions (Addendum)
Great to see you today! Check labs today  Continue current medications See you back in 6 months

## 2022-03-14 ENCOUNTER — Telehealth: Payer: Self-pay

## 2022-03-14 LAB — COMPREHENSIVE METABOLIC PANEL
ALT: 12 IU/L (ref 0–32)
AST: 21 IU/L (ref 0–40)
Albumin/Globulin Ratio: 1.2 (ref 1.2–2.2)
Albumin: 3.9 g/dL (ref 3.9–4.9)
Alkaline Phosphatase: 174 IU/L — ABNORMAL HIGH (ref 44–121)
BUN/Creatinine Ratio: 14 (ref 12–28)
BUN: 19 mg/dL (ref 8–27)
Bilirubin Total: <0.2 mg/dL (ref 0.0–1.2)
CO2: 22 mmol/L (ref 20–29)
Calcium: 8.4 mg/dL — ABNORMAL LOW (ref 8.7–10.3)
Chloride: 107 mmol/L — ABNORMAL HIGH (ref 96–106)
Creatinine, Ser: 1.32 mg/dL — ABNORMAL HIGH (ref 0.57–1.00)
Globulin, Total: 3.3 g/dL (ref 1.5–4.5)
Glucose: 100 mg/dL — ABNORMAL HIGH (ref 70–99)
Potassium: 4.4 mmol/L (ref 3.5–5.2)
Sodium: 141 mmol/L (ref 134–144)
Total Protein: 7.2 g/dL (ref 6.0–8.5)
eGFR: 45 mL/min/{1.73_m2} — ABNORMAL LOW (ref >59)

## 2022-03-14 LAB — CBC WITH DIFFERENTIAL/PLATELET
Basophils Absolute: 0 10*3/uL (ref 0.0–0.2)
Basos: 1 %
EOS (ABSOLUTE): 0 10*3/uL (ref 0.0–0.4)
Eos: 0 %
Hematocrit: 37 % (ref 34.0–46.6)
Hemoglobin: 12 g/dL (ref 11.1–15.9)
Immature Grans (Abs): 0 10*3/uL (ref 0.0–0.1)
Immature Granulocytes: 0 %
Lymphocytes Absolute: 1.6 10*3/uL (ref 0.7–3.1)
Lymphs: 34 %
MCH: 29.3 pg (ref 26.6–33.0)
MCHC: 32.4 g/dL (ref 31.5–35.7)
MCV: 90 fL (ref 79–97)
Monocytes Absolute: 0.4 10*3/uL (ref 0.1–0.9)
Monocytes: 8 %
Neutrophils Absolute: 2.8 10*3/uL (ref 1.4–7.0)
Neutrophils: 57 %
Platelets: 204 10*3/uL (ref 150–450)
RBC: 4.1 x10E6/uL (ref 3.77–5.28)
RDW: 12.6 % (ref 11.7–15.4)
WBC: 4.9 10*3/uL (ref 3.4–10.8)

## 2022-03-14 LAB — CARBAMAZEPINE LEVEL, TOTAL: Carbamazepine (Tegretol), S: 9.5 ug/mL (ref 4.0–12.0)

## 2022-03-14 NOTE — Telephone Encounter (Signed)
Called and spoke to patient, she stated she was aware of her kidney levels being elevated and wanted to let Judson Roch know she is seeing a nephrologist in February to have this evaluated. Pt had no questions at this time but was encouraged to call back if questions arise. Pt verbalized understanding.

## 2022-03-14 NOTE — Telephone Encounter (Signed)
-----  Message from Suzzanne Cloud, NP sent at 03/14/2022  3:02 PM EST ----- Please call, labs continue to show slightly elevated creatinine 1.32, mildly elevated alkaline phosphatase 174 will follow this over time, unclear etiology, potentially medication related?  CBC was normal, carbamazepine level was therapeutic at 9.5.  I will see her at her next revisit.

## 2022-03-27 DIAGNOSIS — E1169 Type 2 diabetes mellitus with other specified complication: Secondary | ICD-10-CM | POA: Diagnosis not present

## 2022-03-27 DIAGNOSIS — E1159 Type 2 diabetes mellitus with other circulatory complications: Secondary | ICD-10-CM | POA: Diagnosis not present

## 2022-03-27 DIAGNOSIS — I129 Hypertensive chronic kidney disease with stage 1 through stage 4 chronic kidney disease, or unspecified chronic kidney disease: Secondary | ICD-10-CM | POA: Diagnosis not present

## 2022-03-28 DIAGNOSIS — M25561 Pain in right knee: Secondary | ICD-10-CM | POA: Diagnosis not present

## 2022-04-13 DIAGNOSIS — E785 Hyperlipidemia, unspecified: Secondary | ICD-10-CM | POA: Diagnosis not present

## 2022-04-13 DIAGNOSIS — I129 Hypertensive chronic kidney disease with stage 1 through stage 4 chronic kidney disease, or unspecified chronic kidney disease: Secondary | ICD-10-CM | POA: Diagnosis not present

## 2022-04-13 DIAGNOSIS — N183 Chronic kidney disease, stage 3 unspecified: Secondary | ICD-10-CM | POA: Diagnosis not present

## 2022-04-13 DIAGNOSIS — E1169 Type 2 diabetes mellitus with other specified complication: Secondary | ICD-10-CM | POA: Diagnosis not present

## 2022-04-18 DIAGNOSIS — M1611 Unilateral primary osteoarthritis, right hip: Secondary | ICD-10-CM | POA: Diagnosis not present

## 2022-04-18 DIAGNOSIS — M545 Low back pain, unspecified: Secondary | ICD-10-CM | POA: Diagnosis not present

## 2022-04-23 DIAGNOSIS — M25561 Pain in right knee: Secondary | ICD-10-CM | POA: Diagnosis not present

## 2022-04-23 DIAGNOSIS — M545 Low back pain, unspecified: Secondary | ICD-10-CM | POA: Diagnosis not present

## 2022-05-25 DIAGNOSIS — Z1231 Encounter for screening mammogram for malignant neoplasm of breast: Secondary | ICD-10-CM | POA: Diagnosis not present

## 2022-06-08 DIAGNOSIS — E785 Hyperlipidemia, unspecified: Secondary | ICD-10-CM | POA: Diagnosis not present

## 2022-06-08 DIAGNOSIS — N183 Chronic kidney disease, stage 3 unspecified: Secondary | ICD-10-CM | POA: Diagnosis not present

## 2022-06-08 DIAGNOSIS — E1169 Type 2 diabetes mellitus with other specified complication: Secondary | ICD-10-CM | POA: Diagnosis not present

## 2022-06-08 DIAGNOSIS — I129 Hypertensive chronic kidney disease with stage 1 through stage 4 chronic kidney disease, or unspecified chronic kidney disease: Secondary | ICD-10-CM | POA: Diagnosis not present

## 2022-06-08 DIAGNOSIS — K219 Gastro-esophageal reflux disease without esophagitis: Secondary | ICD-10-CM | POA: Diagnosis not present

## 2022-06-26 DIAGNOSIS — H5440 Blindness, one eye, unspecified eye: Secondary | ICD-10-CM | POA: Diagnosis not present

## 2022-06-26 DIAGNOSIS — R232 Flushing: Secondary | ICD-10-CM | POA: Diagnosis not present

## 2022-06-26 DIAGNOSIS — N183 Chronic kidney disease, stage 3 unspecified: Secondary | ICD-10-CM | POA: Diagnosis not present

## 2022-06-26 DIAGNOSIS — Z9181 History of falling: Secondary | ICD-10-CM | POA: Diagnosis not present

## 2022-06-26 DIAGNOSIS — G501 Atypical facial pain: Secondary | ICD-10-CM | POA: Diagnosis not present

## 2022-06-26 DIAGNOSIS — E1169 Type 2 diabetes mellitus with other specified complication: Secondary | ICD-10-CM | POA: Diagnosis not present

## 2022-06-26 DIAGNOSIS — E785 Hyperlipidemia, unspecified: Secondary | ICD-10-CM | POA: Diagnosis not present

## 2022-06-26 DIAGNOSIS — E1122 Type 2 diabetes mellitus with diabetic chronic kidney disease: Secondary | ICD-10-CM | POA: Diagnosis not present

## 2022-06-26 DIAGNOSIS — G479 Sleep disorder, unspecified: Secondary | ICD-10-CM | POA: Diagnosis not present

## 2022-06-26 DIAGNOSIS — I129 Hypertensive chronic kidney disease with stage 1 through stage 4 chronic kidney disease, or unspecified chronic kidney disease: Secondary | ICD-10-CM | POA: Diagnosis not present

## 2022-06-28 DIAGNOSIS — R232 Flushing: Secondary | ICD-10-CM | POA: Diagnosis not present

## 2022-06-28 DIAGNOSIS — I129 Hypertensive chronic kidney disease with stage 1 through stage 4 chronic kidney disease, or unspecified chronic kidney disease: Secondary | ICD-10-CM | POA: Diagnosis not present

## 2022-06-30 DIAGNOSIS — R609 Edema, unspecified: Secondary | ICD-10-CM | POA: Diagnosis not present

## 2022-07-02 DIAGNOSIS — S7002XA Contusion of left hip, initial encounter: Secondary | ICD-10-CM | POA: Diagnosis not present

## 2022-07-09 DIAGNOSIS — N183 Chronic kidney disease, stage 3 unspecified: Secondary | ICD-10-CM | POA: Diagnosis not present

## 2022-07-09 DIAGNOSIS — N2581 Secondary hyperparathyroidism of renal origin: Secondary | ICD-10-CM | POA: Diagnosis not present

## 2022-07-09 DIAGNOSIS — E785 Hyperlipidemia, unspecified: Secondary | ICD-10-CM | POA: Diagnosis not present

## 2022-07-09 DIAGNOSIS — I129 Hypertensive chronic kidney disease with stage 1 through stage 4 chronic kidney disease, or unspecified chronic kidney disease: Secondary | ICD-10-CM | POA: Diagnosis not present

## 2022-07-09 DIAGNOSIS — D631 Anemia in chronic kidney disease: Secondary | ICD-10-CM | POA: Diagnosis not present

## 2022-07-11 DIAGNOSIS — I129 Hypertensive chronic kidney disease with stage 1 through stage 4 chronic kidney disease, or unspecified chronic kidney disease: Secondary | ICD-10-CM | POA: Diagnosis not present

## 2022-07-11 DIAGNOSIS — R232 Flushing: Secondary | ICD-10-CM | POA: Diagnosis not present

## 2022-08-10 DIAGNOSIS — M810 Age-related osteoporosis without current pathological fracture: Secondary | ICD-10-CM | POA: Diagnosis not present

## 2022-08-28 IMAGING — CR DG CHEST 2V
2 series · 2 of 2 positions shown · non-contrast
Comparison: None.

CLINICAL DATA: Chest pain.

EXAM:
CHEST - 2 VIEW

[w chest lat]
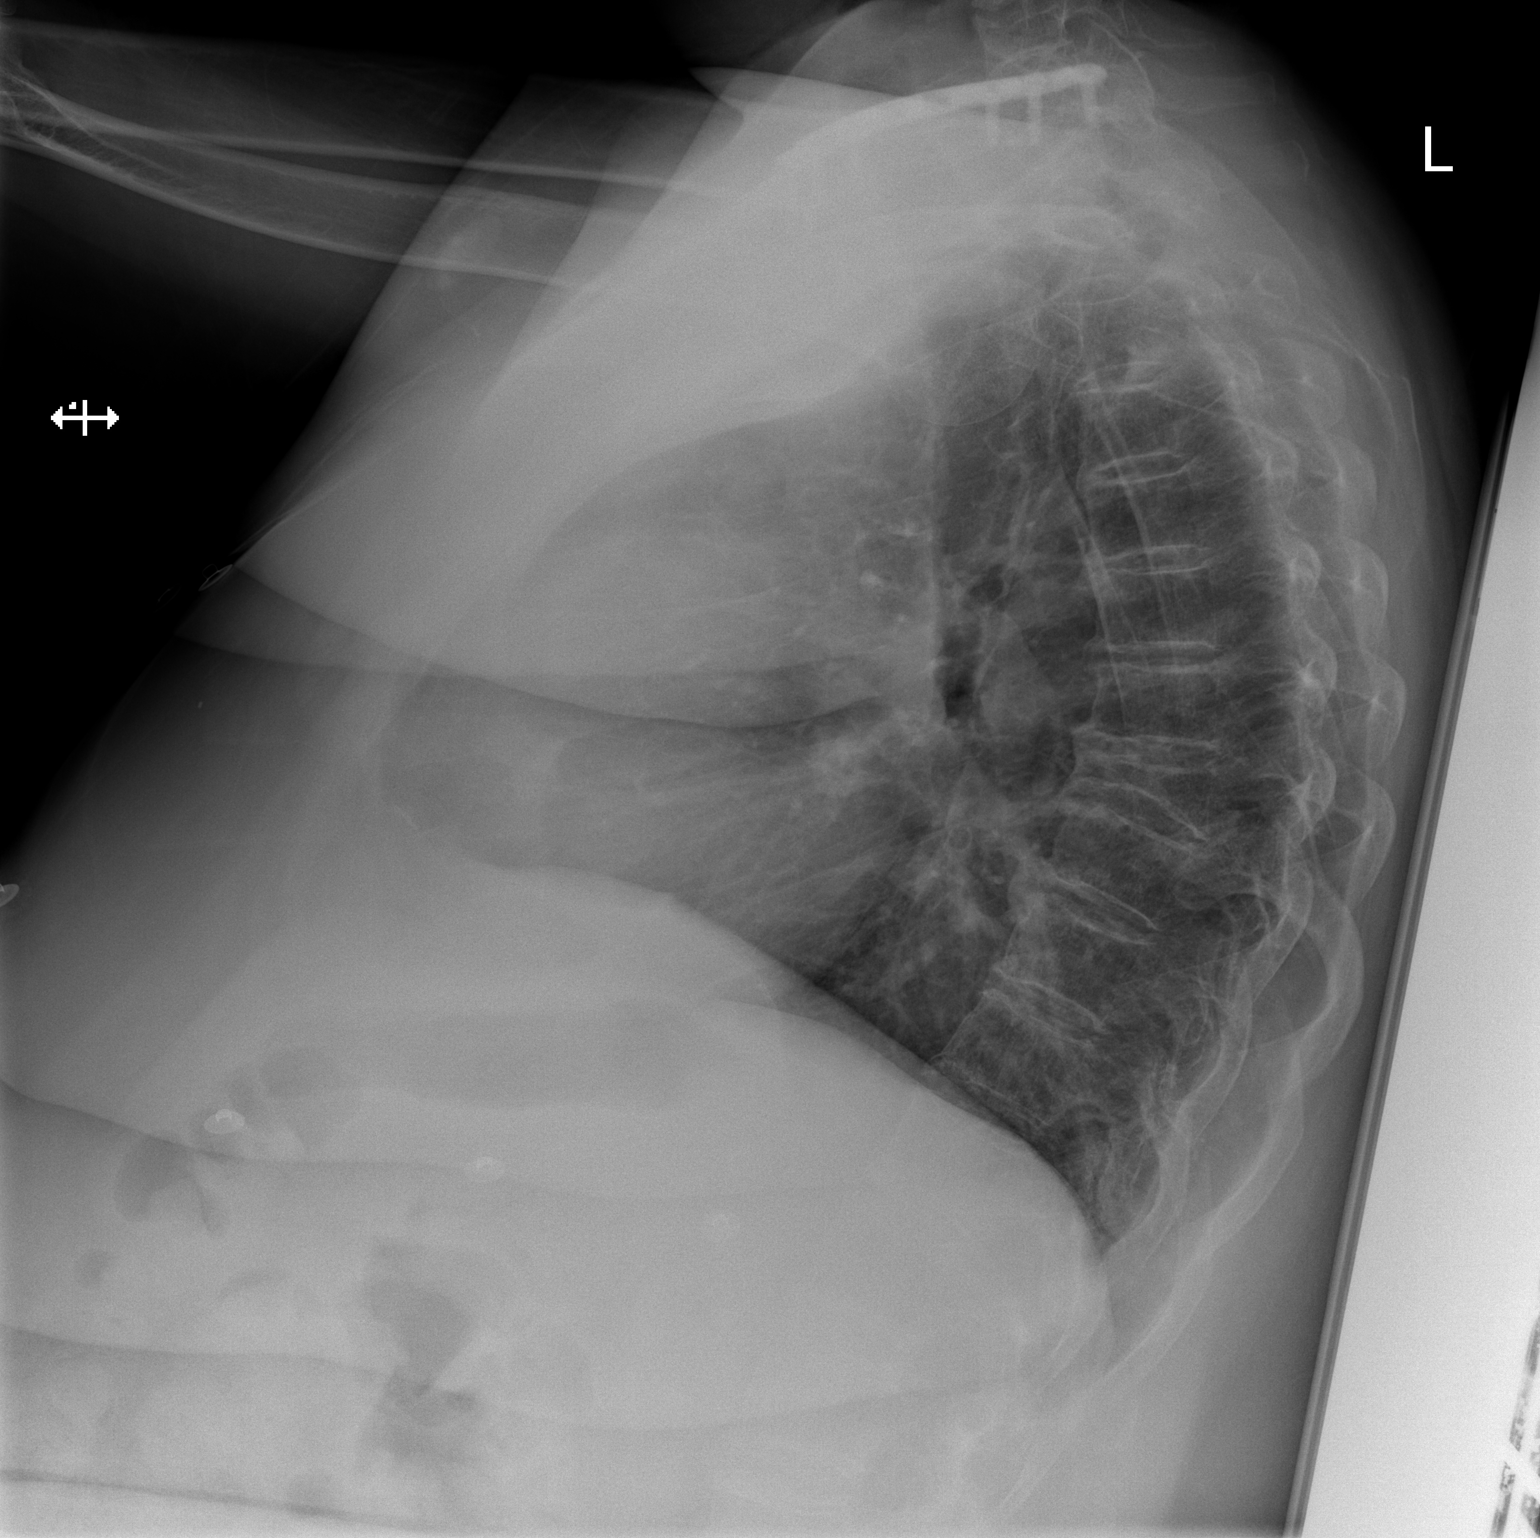

[x chest ap]
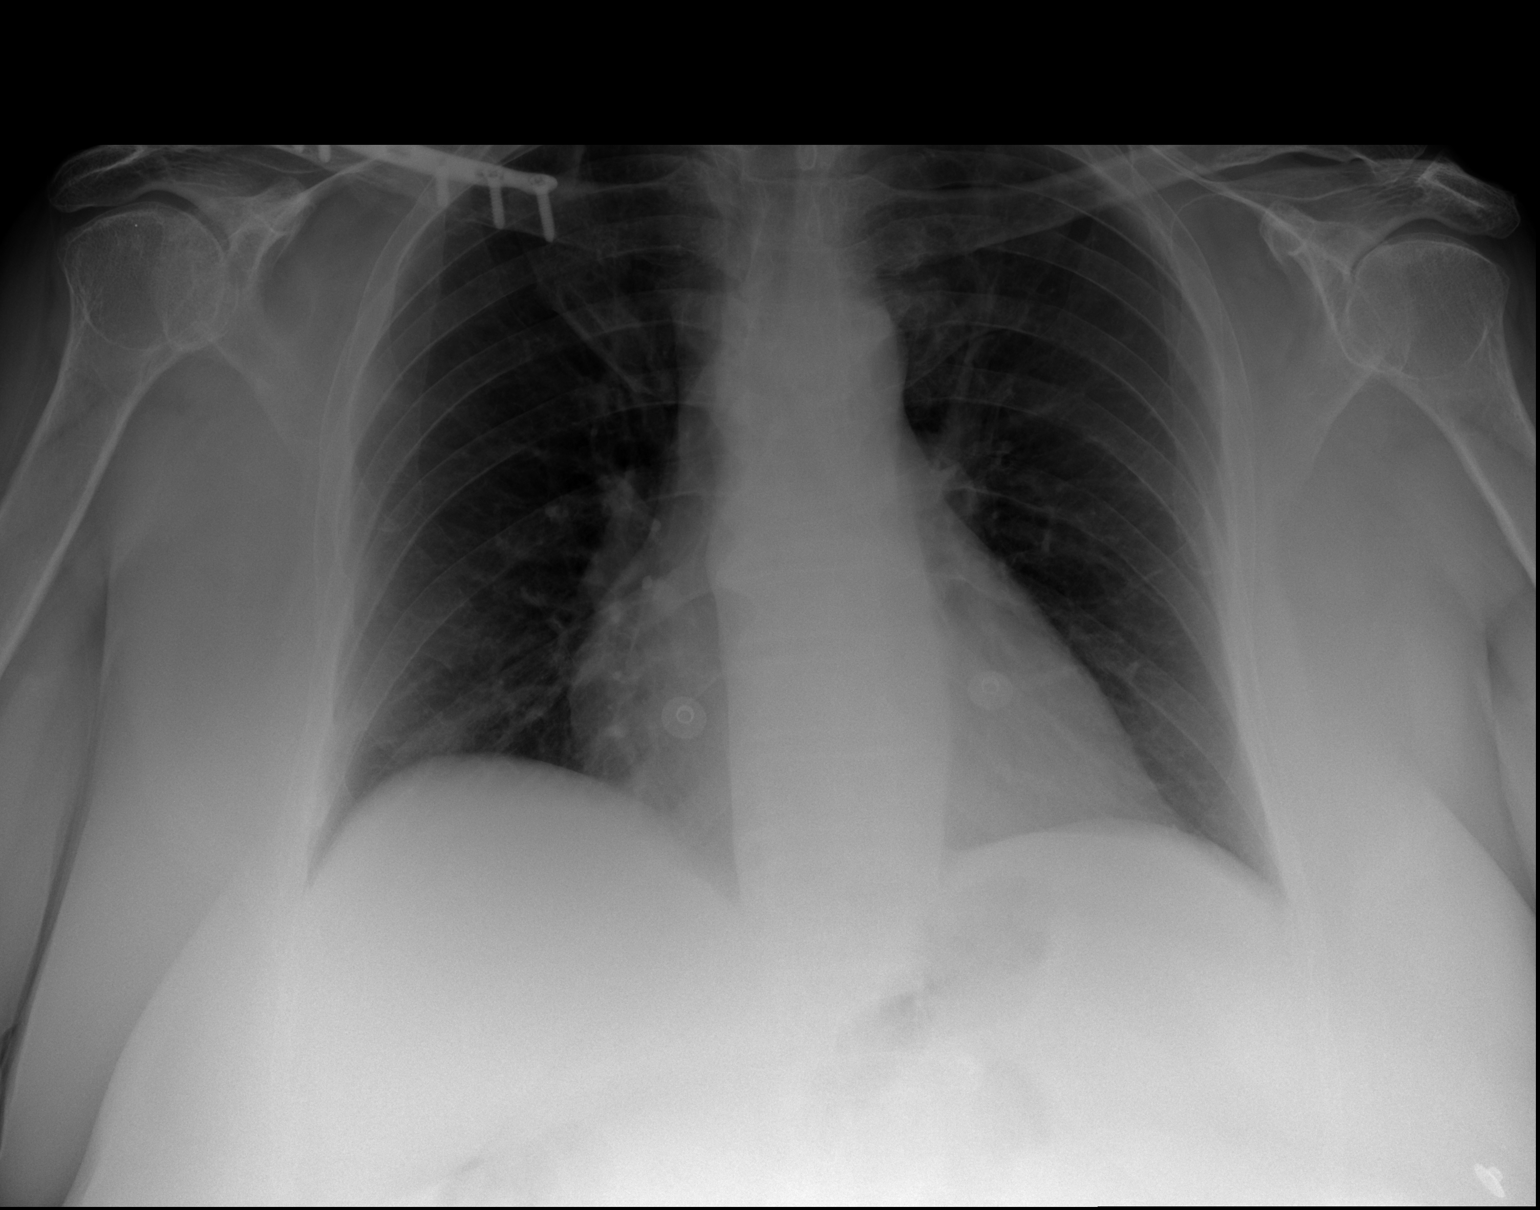

[2 of 2 positions shown; findings below may reference images not displayed]

FINDINGS: Cardiomediastinal silhouette is normal. Mediastinal contours appear
intact.

There is no evidence of focal airspace consolidation, pleural
effusion or pneumothorax.

Chronic compression deformity of 1 of the midthoracic vertebral
bodies, stable from 1534. Soft tissues are grossly normal.
IMPRESSION: No active cardiopulmonary disease.

## 2022-08-29 IMAGING — CT CT ABD-PELV W/ CM
2 of 5 series · 17 of 46 positions shown, 19 images · IV contrast (Omnipaque)
Comparison: None.

CLINICAL DATA: Abdominal pain, fever

EXAM:
CT ABDOMEN AND PELVIS WITH CONTRAST
TECHNIQUE: Multidetector CT imaging of the abdomen and pelvis was performed
using the standard protocol following bolus administration of
intravenous contrast.
CONTRAST:  100mL OMNIPAQUE IOHEXOL 300 MG/ML  SOLN

[Series 2: axial st · axial · 0.98mm/px · z∈[-378,+16]mm · 14 of 89 slices shown, 16 images]
[im 5/89  soft-tissue]
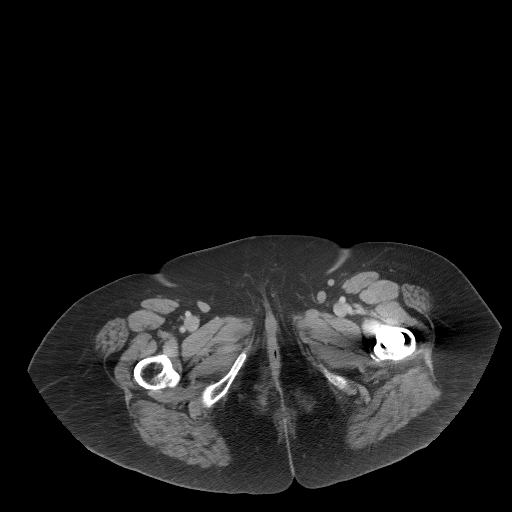
[im 5/89  bone]
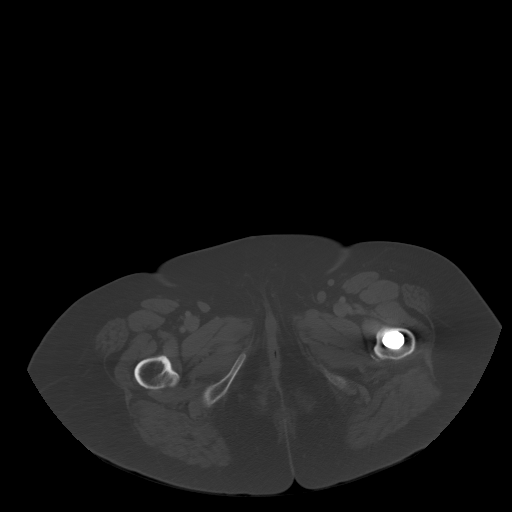
[im 10/89  soft-tissue]
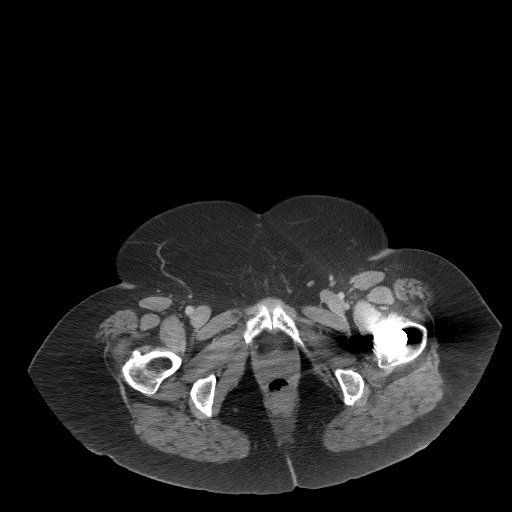
[im 19/89  soft-tissue]
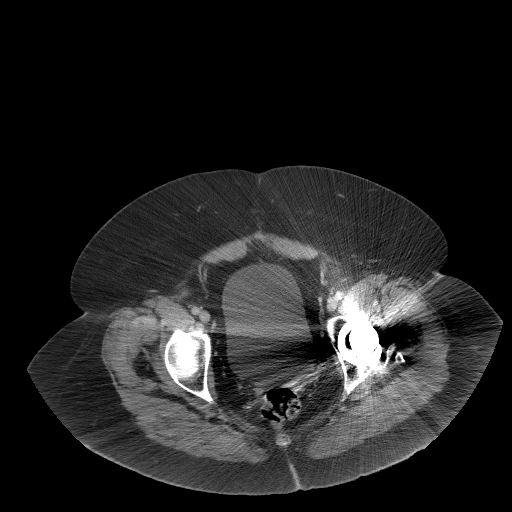
[im 24/89  soft-tissue]
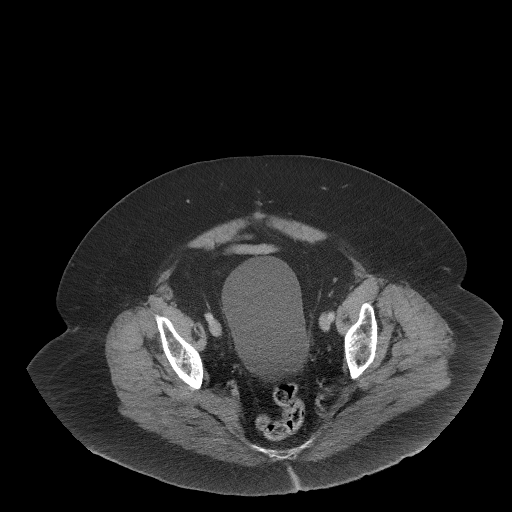
[im 28/89  soft-tissue]
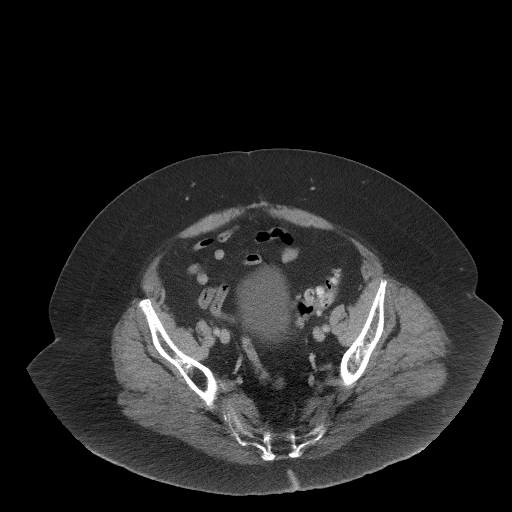
[im 38/89  soft-tissue]
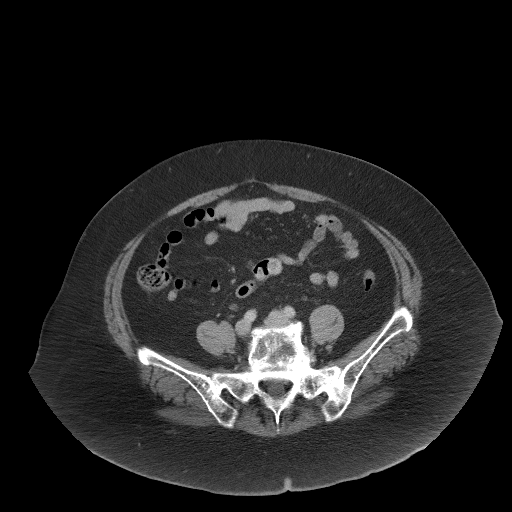
[im 42/89  soft-tissue]
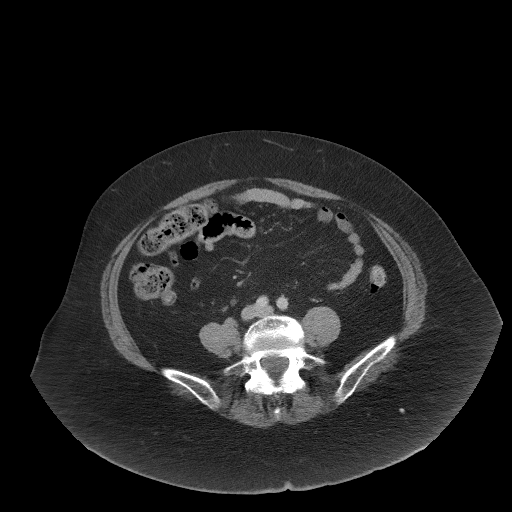
[im 47/89  soft-tissue]
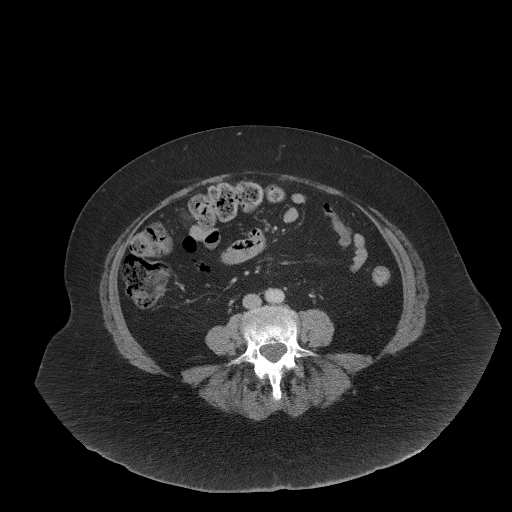
[im 51/89  soft-tissue]
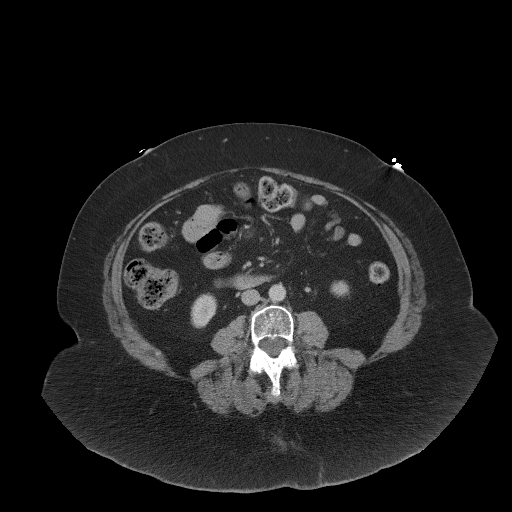
[im 51/89  bone]
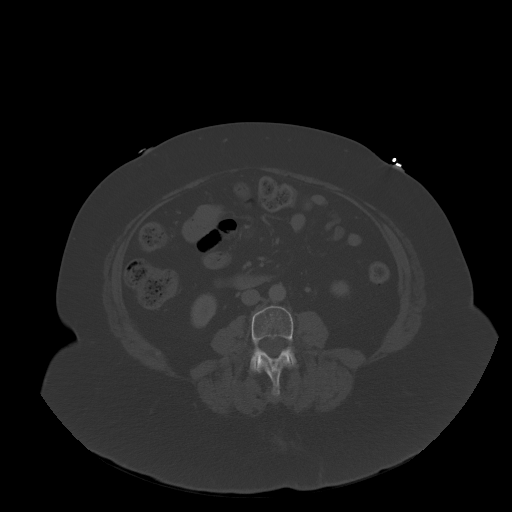
[im 61/89  soft-tissue]
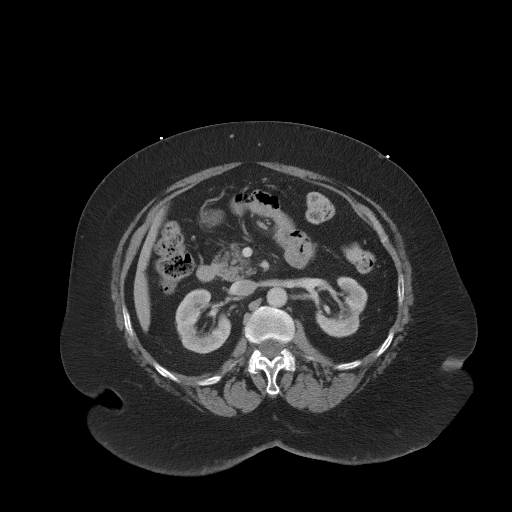
[im 65/89  soft-tissue]
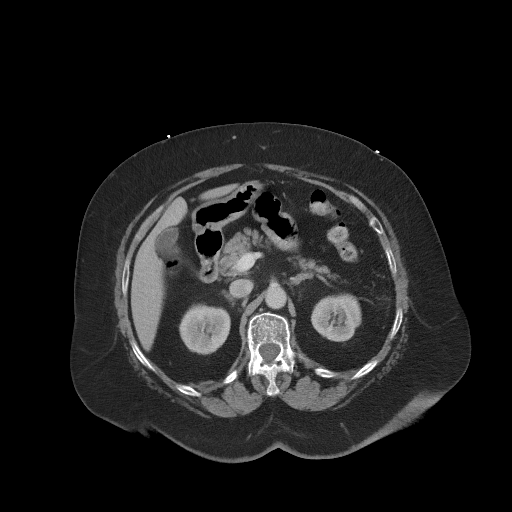
[im 70/89  soft-tissue]
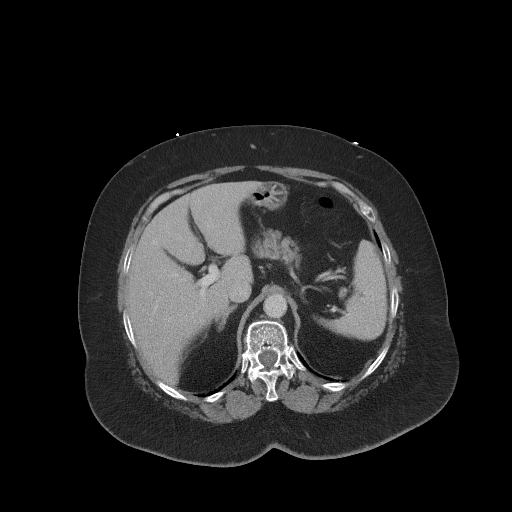
[im 79/89  soft-tissue]
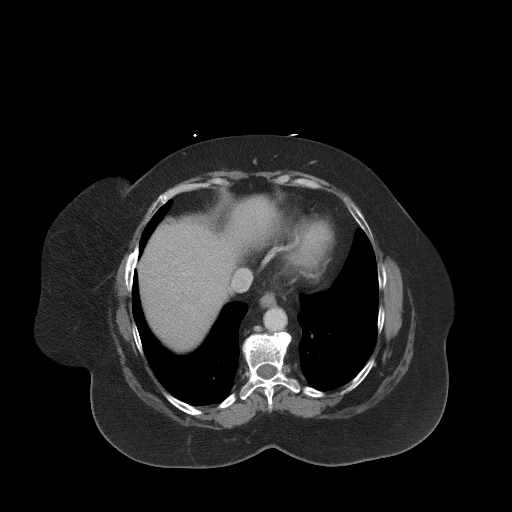
[im 84/89  soft-tissue]
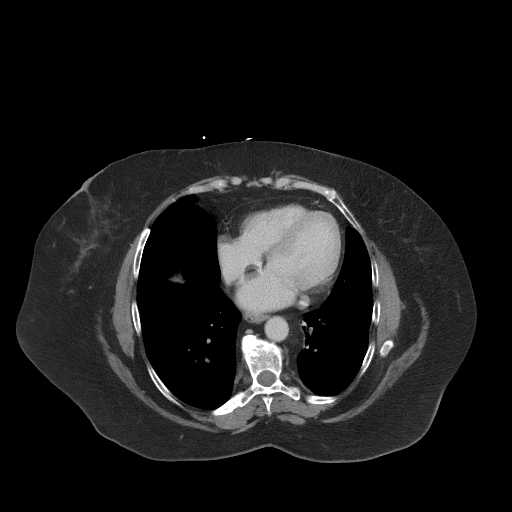

[Series 5: coronal st · coronal · 0.98mm/px · 3 of 129 slices shown]
[im 43/129  soft-tissue]
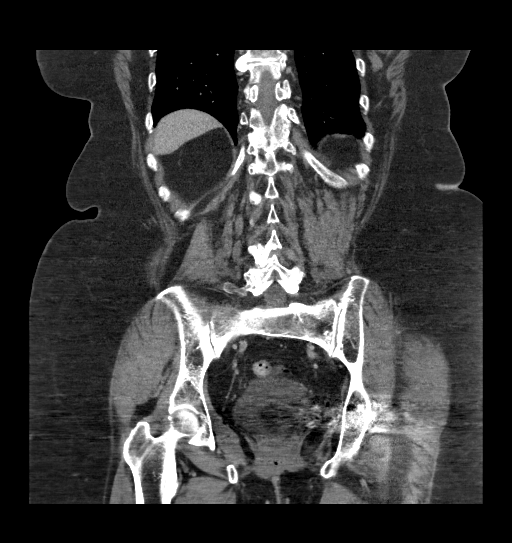
[im 57/129  soft-tissue]
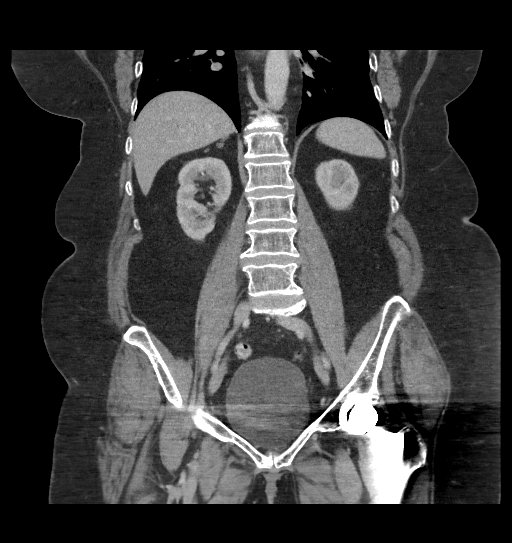
[im 72/129  soft-tissue]
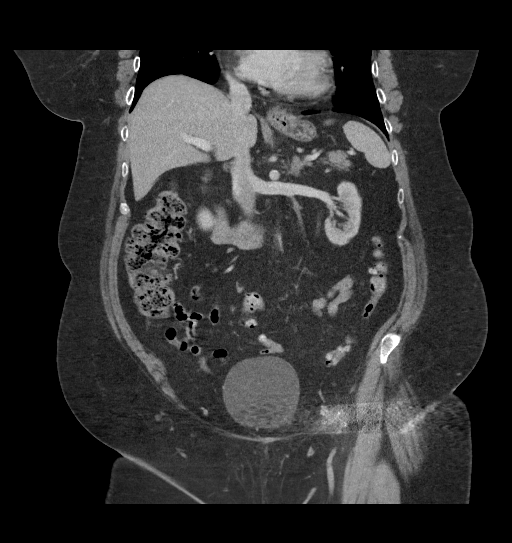

[17 of 46 positions shown; findings below may reference images not displayed]

FINDINGS: Lower chest: Included lung bases are clear. Heart size within normal
limits.

Hepatobiliary: 1.0 cm cyst within the left hepatic lobe. Liver is
otherwise within normal limits. Unremarkable gallbladder. No
hyperdense gallstone. No biliary dilatation.

Pancreas: Unremarkable. No pancreatic ductal dilatation or
surrounding inflammatory changes.

Spleen: Normal in size without focal abnormality.

Adrenals/Urinary Tract: Unremarkable adrenal glands. Area of
cortical scarring within the interpolar region of the left kidney.
Kidneys have otherwise symmetric enhancement. No renal stone or
hydronephrosis. Ureters and urinary bladder within normal limits.

Stomach/Bowel: Small hiatal hernia. Stomach within normal limits. No
dilated loops of bowel. Diverticulosis most predominantly involving
the sigmoid colon. No focal bowel wall thickening or inflammatory
changes.

Vascular/Lymphatic: No significant vascular findings are present. No
enlarged abdominal or pelvic lymph nodes.

Reproductive: Status post hysterectomy. No adnexal masses.

Other: No free fluid. No abdominopelvic fluid collection. No
pneumoperitoneum. No abdominal wall hernia.

Musculoskeletal: Postsurgical changes from left total hip
arthroplasty. No acute bony findings.
IMPRESSION: 1. No acute abdominopelvic findings.
2. Diverticulosis without evidence of acute diverticulitis.
3. Small hiatal hernia.

## 2022-09-10 ENCOUNTER — Telehealth: Payer: Self-pay

## 2022-09-10 NOTE — Telephone Encounter (Signed)
Received disability paperwork from the front staff. I do not see a note to complete so will send to provider to see if filling this out is appropriate.

## 2022-09-10 NOTE — Telephone Encounter (Signed)
Paperwork is filled out and in Provider office for signature.

## 2022-09-16 ENCOUNTER — Other Ambulatory Visit: Payer: Self-pay | Admitting: Neurology

## 2022-09-18 NOTE — Telephone Encounter (Signed)
Requested Prescriptions   Pending Prescriptions Disp Refills   carbamazepine (TEGRETOL) 200 MG tablet [Pharmacy Med Name: CARBAMAZEPINE 200 MG TABLET] 270 tablet 1    Sig: TAKE 2 TABS BY MOUTH EVERY MORNING AND 1 TAB EVERY EVENING   RECEIVED THIS HARD STOP:  High Drug-Drug: buPROPion and carbamazepine PLEASE ADVISE,  Allaya Abbasi

## 2022-09-24 NOTE — Telephone Encounter (Signed)
Pt calling check the status of paperwork for disability. Pt said received call from [employer],need paperwork faxed or they will stop pt's disability. Need paper worked to [phone] as soon as possible.

## 2022-09-27 ENCOUNTER — Ambulatory Visit: Payer: Medicare Other | Admitting: Neurology

## 2022-09-27 ENCOUNTER — Encounter: Payer: Self-pay | Admitting: Neurology

## 2022-09-27 VITALS — BP 150/80 | HR 61 | Ht 65.0 in | Wt 227.6 lb

## 2022-09-27 DIAGNOSIS — H5440 Blindness, one eye, unspecified eye: Secondary | ICD-10-CM | POA: Diagnosis not present

## 2022-09-27 DIAGNOSIS — G501 Atypical facial pain: Secondary | ICD-10-CM

## 2022-09-27 MED ORDER — LEVETIRACETAM 500 MG PO TABS: 500.0000 mg | ORAL_TABLET | Freq: Two times a day (BID) | ORAL | 3 refills | Status: DC

## 2022-09-27 MED ORDER — CARBAMAZEPINE 200 MG PO TABS: ORAL_TABLET | ORAL | 3 refills | Status: DC

## 2022-09-27 NOTE — Patient Instructions (Addendum)
Try to reduce carbamazepine to 1.5 tablet AM/1 tablet in PM Continue Keppra. Check metabolic panel today. Continue seeing primary care doctor.  Meds ordered this encounter  Medications   levETIRAcetam (KEPPRA) 500 MG tablet    Sig: Take 1 tablet (500 mg total) by mouth 2 (two) times daily.    Dispense:  180 tablet    Refill:  3   carbamazepine (TEGRETOL) 200 MG tablet    Sig: TAKE 2 TABS BY MOUTH EVERY MORNING AND 1 TAB EVERY EVENING    Dispense:  270 tablet    Refill:  3
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
## 2022-10-30 ENCOUNTER — Other Ambulatory Visit: Payer: Self-pay | Admitting: Family Medicine

## 2022-10-30 DIAGNOSIS — R911 Solitary pulmonary nodule: Secondary | ICD-10-CM

## 2022-11-06 DIAGNOSIS — R911 Solitary pulmonary nodule: Secondary | ICD-10-CM | POA: Diagnosis not present

## 2022-11-06 DIAGNOSIS — Z23 Encounter for immunization: Secondary | ICD-10-CM | POA: Diagnosis not present

## 2022-11-06 DIAGNOSIS — H5440 Blindness, one eye, unspecified eye: Secondary | ICD-10-CM | POA: Diagnosis not present

## 2022-11-06 DIAGNOSIS — N183 Chronic kidney disease, stage 3 unspecified: Secondary | ICD-10-CM | POA: Diagnosis not present

## 2022-11-06 DIAGNOSIS — I700 Atherosclerosis of aorta: Secondary | ICD-10-CM | POA: Diagnosis not present

## 2022-11-06 DIAGNOSIS — N2581 Secondary hyperparathyroidism of renal origin: Secondary | ICD-10-CM | POA: Diagnosis not present

## 2022-11-06 DIAGNOSIS — E1122 Type 2 diabetes mellitus with diabetic chronic kidney disease: Secondary | ICD-10-CM | POA: Diagnosis not present

## 2022-11-20 ENCOUNTER — Telehealth: Payer: Self-pay | Admitting: Neurology

## 2022-11-20 NOTE — Telephone Encounter (Signed)
Gi Or Norman @ [employer] is calling regarding completed forms for pt that Maralyn Sago, NP completed.  Mick Sell states there is confusion on information submitted by Maralyn Sago, NP , she'd like a call back to discuss.  Please call her at [phone]

## 2022-11-20 NOTE — Telephone Encounter (Signed)
Call back to [facility], no answer.left message to call office back.

## 2022-11-29 ENCOUNTER — Telehealth: Payer: Self-pay | Admitting: Neurology

## 2022-11-29 NOTE — Telephone Encounter (Signed)
Pt called wanting to inform the provider that her handicap card has expired and is needing to get it renewed. Pt is coming in to get labs and would like to know if she can start the paperwork at that time. Please advise.

## 2022-12-03 DIAGNOSIS — I10 Essential (primary) hypertension: Secondary | ICD-10-CM | POA: Diagnosis not present

## 2022-12-03 DIAGNOSIS — E785 Hyperlipidemia, unspecified: Secondary | ICD-10-CM | POA: Diagnosis not present

## 2022-12-03 DIAGNOSIS — E119 Type 2 diabetes mellitus without complications: Secondary | ICD-10-CM | POA: Diagnosis not present

## 2022-12-03 DIAGNOSIS — E559 Vitamin D deficiency, unspecified: Secondary | ICD-10-CM | POA: Diagnosis not present

## 2022-12-25 DIAGNOSIS — H60503 Unspecified acute noninfective otitis externa, bilateral: Secondary | ICD-10-CM | POA: Diagnosis not present

## 2022-12-25 DIAGNOSIS — R233 Spontaneous ecchymoses: Secondary | ICD-10-CM | POA: Diagnosis not present

## 2023-01-09 DIAGNOSIS — D631 Anemia in chronic kidney disease: Secondary | ICD-10-CM | POA: Diagnosis not present

## 2023-01-09 DIAGNOSIS — E785 Hyperlipidemia, unspecified: Secondary | ICD-10-CM | POA: Diagnosis not present

## 2023-01-09 DIAGNOSIS — I129 Hypertensive chronic kidney disease with stage 1 through stage 4 chronic kidney disease, or unspecified chronic kidney disease: Secondary | ICD-10-CM | POA: Diagnosis not present

## 2023-01-09 DIAGNOSIS — N2581 Secondary hyperparathyroidism of renal origin: Secondary | ICD-10-CM | POA: Diagnosis not present

## 2023-01-09 DIAGNOSIS — N183 Chronic kidney disease, stage 3 unspecified: Secondary | ICD-10-CM | POA: Diagnosis not present

## 2023-01-14 ENCOUNTER — Ambulatory Visit: Payer: Medicare Other | Admitting: Dietician

## 2023-01-29 DIAGNOSIS — H3342 Traction detachment of retina, left eye: Secondary | ICD-10-CM | POA: Diagnosis not present

## 2023-01-29 DIAGNOSIS — N2581 Secondary hyperparathyroidism of renal origin: Secondary | ICD-10-CM | POA: Diagnosis not present

## 2023-01-29 DIAGNOSIS — N183 Chronic kidney disease, stage 3 unspecified: Secondary | ICD-10-CM | POA: Diagnosis not present

## 2023-01-29 DIAGNOSIS — E119 Type 2 diabetes mellitus without complications: Secondary | ICD-10-CM | POA: Diagnosis not present

## 2023-01-29 DIAGNOSIS — E1122 Type 2 diabetes mellitus with diabetic chronic kidney disease: Secondary | ICD-10-CM | POA: Diagnosis not present

## 2023-01-29 DIAGNOSIS — I700 Atherosclerosis of aorta: Secondary | ICD-10-CM | POA: Diagnosis not present

## 2023-01-29 DIAGNOSIS — Z961 Presence of intraocular lens: Secondary | ICD-10-CM | POA: Diagnosis not present

## 2023-01-29 DIAGNOSIS — R296 Repeated falls: Secondary | ICD-10-CM | POA: Diagnosis not present

## 2023-01-29 DIAGNOSIS — H5440 Blindness, one eye, unspecified eye: Secondary | ICD-10-CM | POA: Diagnosis not present

## 2023-01-29 DIAGNOSIS — I129 Hypertensive chronic kidney disease with stage 1 through stage 4 chronic kidney disease, or unspecified chronic kidney disease: Secondary | ICD-10-CM | POA: Diagnosis not present

## 2023-01-29 DIAGNOSIS — H15002 Unspecified scleritis, left eye: Secondary | ICD-10-CM | POA: Diagnosis not present

## 2023-01-29 DIAGNOSIS — E785 Hyperlipidemia, unspecified: Secondary | ICD-10-CM | POA: Diagnosis not present

## 2023-02-18 DIAGNOSIS — M810 Age-related osteoporosis without current pathological fracture: Secondary | ICD-10-CM | POA: Diagnosis not present

## 2023-02-21 DIAGNOSIS — S8991XA Unspecified injury of right lower leg, initial encounter: Secondary | ICD-10-CM | POA: Diagnosis not present

## 2023-02-21 DIAGNOSIS — R233 Spontaneous ecchymoses: Secondary | ICD-10-CM | POA: Diagnosis not present

## 2023-02-23 ENCOUNTER — Emergency Department (HOSPITAL_BASED_OUTPATIENT_CLINIC_OR_DEPARTMENT_OTHER)
Admission: EM | Admit: 2023-02-23 | Discharge: 2023-02-24 | Disposition: A | Discharge status: Home / Self Care | Payer: Medicare Other | Attending: Emergency Medicine | Admitting: Emergency Medicine

## 2023-02-23 ENCOUNTER — Other Ambulatory Visit: Payer: Self-pay

## 2023-02-23 ENCOUNTER — Encounter (HOSPITAL_BASED_OUTPATIENT_CLINIC_OR_DEPARTMENT_OTHER): Payer: Self-pay

## 2023-02-23 ENCOUNTER — Emergency Department (HOSPITAL_BASED_OUTPATIENT_CLINIC_OR_DEPARTMENT_OTHER): Payer: Medicare Other | Admitting: Radiology

## 2023-02-23 DIAGNOSIS — R112 Nausea with vomiting, unspecified: Secondary | ICD-10-CM | POA: Diagnosis present

## 2023-02-23 DIAGNOSIS — Z7982 Long term (current) use of aspirin: Secondary | ICD-10-CM | POA: Diagnosis not present

## 2023-02-23 DIAGNOSIS — Z20822 Contact with and (suspected) exposure to covid-19: Secondary | ICD-10-CM | POA: Insufficient documentation

## 2023-02-23 DIAGNOSIS — R55 Syncope and collapse: Secondary | ICD-10-CM | POA: Diagnosis not present

## 2023-02-23 DIAGNOSIS — N3000 Acute cystitis without hematuria: Secondary | ICD-10-CM | POA: Diagnosis not present

## 2023-02-23 DIAGNOSIS — E860 Dehydration: Secondary | ICD-10-CM | POA: Diagnosis not present

## 2023-02-23 DIAGNOSIS — I700 Atherosclerosis of aorta: Secondary | ICD-10-CM | POA: Diagnosis not present

## 2023-02-23 LAB — CBC WITH DIFFERENTIAL/PLATELET
Abs Immature Granulocytes: 0.02 10*3/uL (ref 0.00–0.07)
Basophils Absolute: 0 10*3/uL (ref 0.0–0.1)
Basophils Relative: 0 %
Eosinophils Absolute: 0 10*3/uL (ref 0.0–0.5)
Eosinophils Relative: 0 %
HCT: 37.8 % (ref 36.0–46.0)
Hemoglobin: 12.3 g/dL (ref 12.0–15.0)
Immature Granulocytes: 0 %
Lymphocytes Relative: 24 %
Lymphs Abs: 1.6 10*3/uL (ref 0.7–4.0)
MCH: 29.9 pg (ref 26.0–34.0)
MCHC: 32.5 g/dL (ref 30.0–36.0)
MCV: 91.7 fL (ref 80.0–100.0)
Monocytes Absolute: 0.5 10*3/uL (ref 0.1–1.0)
Monocytes Relative: 7 %
Neutro Abs: 4.7 10*3/uL (ref 1.7–7.7)
Neutrophils Relative %: 69 %
Platelets: 221 10*3/uL (ref 150–400)
RBC: 4.12 MIL/uL (ref 3.87–5.11)
RDW: 12.5 % (ref 11.5–15.5)
WBC: 6.9 10*3/uL (ref 4.0–10.5)
nRBC: 0 % (ref 0.0–0.2)

## 2023-02-23 LAB — COMPREHENSIVE METABOLIC PANEL
ALT: 12 U/L (ref 0–44)
AST: 21 U/L (ref 15–41)
Albumin: 4.1 g/dL (ref 3.5–5.0)
Alkaline Phosphatase: 115 U/L (ref 38–126)
Anion gap: 8 (ref 5–15)
BUN: 17 mg/dL (ref 8–23)
CO2: 30 mmol/L (ref 22–32)
Calcium: 8.9 mg/dL (ref 8.9–10.3)
Chloride: 98 mmol/L (ref 98–111)
Creatinine, Ser: 1.66 mg/dL — ABNORMAL HIGH (ref 0.44–1.00)
GFR, Estimated: 34 mL/min — ABNORMAL LOW (ref >60)
Glucose, Bld: 124 mg/dL — ABNORMAL HIGH (ref 70–99)
Potassium: 3.5 mmol/L (ref 3.5–5.1)
Sodium: 136 mmol/L (ref 135–145)
Total Bilirubin: 0.4 mg/dL (ref 0.0–1.2)
Total Protein: 8.7 g/dL — ABNORMAL HIGH (ref 6.5–8.1)

## 2023-02-23 LAB — URINALYSIS, W/ REFLEX TO CULTURE (INFECTION SUSPECTED)
Bilirubin Urine: NEGATIVE
Glucose, UA: >1000 mg/dL — AB
Hgb urine dipstick: NEGATIVE
Ketones, ur: NEGATIVE mg/dL
Nitrite: NEGATIVE
Protein, ur: NEGATIVE mg/dL
Specific Gravity, Urine: 1.015 (ref 1.005–1.030)
pH: 5.5 (ref 5.0–8.0)

## 2023-02-23 LAB — RESP PANEL BY RT-PCR (RSV, FLU A&B, COVID)  RVPGX2
Influenza A by PCR: NEGATIVE
Influenza B by PCR: NEGATIVE
Resp Syncytial Virus by PCR: NEGATIVE
SARS Coronavirus 2 by RT PCR: NEGATIVE

## 2023-02-23 LAB — CBG MONITORING, ED: Glucose-Capillary: 89 mg/dL (ref 70–99)

## 2023-02-23 NOTE — ED Triage Notes (Signed)
 Pt arrives with c/o n/v/d that started 4 days ago. Pt recently started ozempic for diabetes. Pt denies fevers. Pt reports chills and bodyaches.

## 2023-02-24 LAB — LIPASE, BLOOD: Lipase: 25 U/L (ref 11–51)

## 2023-02-24 MED ORDER — SODIUM CHLORIDE 0.9 % IV BOLUS
1000.0000 mL | Freq: Once | INTRAVENOUS | Status: AC
  Administered 2023-02-24: 1000 mL via INTRAVENOUS

## 2023-02-24 MED ORDER — CEPHALEXIN 250 MG PO CAPS
1000.0000 mg | ORAL_CAPSULE | Freq: Once | ORAL | Status: AC
  Administered 2023-02-24: 1000 mg via ORAL
  Filled 2023-02-24: qty 4

## 2023-02-24 MED ORDER — CEPHALEXIN 500 MG PO CAPS: 500.0000 mg | ORAL_CAPSULE | Freq: Four times a day (QID) | ORAL | 0 refills | Status: DC

## 2023-02-24 MED ORDER — ONDANSETRON HCL 4 MG/2ML IJ SOLN
4.0000 mg | Freq: Once | INTRAMUSCULAR | Status: AC
  Administered 2023-02-24: 4 mg via INTRAVENOUS
  Filled 2023-02-24: qty 2

## 2023-02-24 MED ORDER — ONDANSETRON 4 MG PO TBDP: 4.0000 mg | ORAL_TABLET | Freq: Three times a day (TID) | ORAL | 0 refills | Status: AC | PRN

## 2023-02-24 NOTE — ED Provider Notes (Signed)
 [place] EMERGENCY DEPARTMENT AT [facility] Provider Note   CSN: [id number] Arrival date & time: 02/23/23  1701     History  Chief Complaint  Patient presents with   Emesis    Summer BRAZELL is a 68 y.o. female.  Patient feeling unwell since at least Tuesday with multiple episodes of nonbloody nonbilious emesis every day since then.  Patient states that she saw her doctor on Wednesday and they started her on Ozempic at that time but patient was very symptomatic prior to that however this did make her feel worse.  She has had decreased sleep, increased emesis to the point that she is having some upper abdominal and posterior back pain.  Worse when she moves certain ways.  No known fevers.  Has had some dark dysuria.  Had diarrhea initially but that has improved.  Now she is feels tired but cannot sleep.  Achy all over and still having nausea.  No known sick contacts.  No suspicious food intake.   Emesis      Home Medications Prior to Admission medications   Medication Sig Start Date End Date Taking? Authorizing Provider  cephALEXin  (KEFLEX ) 500 MG capsule Take 1 capsule (500 mg total) by mouth 4 (four) times daily. 02/24/23  Yes Chloe Baig, Selinda, MD  ondansetron  (ZOFRAN -ODT) 4 MG disintegrating tablet Take 1 tablet (4 mg total) by mouth every 8 (eight) hours as needed for vomiting. 02/24/23  Yes Tameika Heckmann, Selinda, MD  acetaminophen  (TYLENOL ) 500 MG tablet Take 1,000 mg by mouth every 6 (six) hours as needed for mild pain.    [provider]  ALPRAZolam  (XANAX ) 0.25 MG tablet Take 0.125-0.25 mg by mouth daily as needed for anxiety.    [provider]  amLODipine  (NORVASC ) 2.5 MG tablet Take 1 tablet (2.5 mg total) by mouth daily. 11/01/21   Regalado, Belkys A, MD  aspirin  EC 81 MG tablet Take 81 mg by mouth in the morning. Swallow whole.    [provider]  atorvastatin  (LIPITOR) 40 MG tablet Take 40 mg by mouth every evening. 03/20/19   [provider]  buPROPion  (WELLBUTRIN  XL) 150 MG 24 hr tablet Take 150 mg by mouth every morning. 03/01/22   [provider]  carbamazepine  (TEGRETOL ) 200 MG tablet TAKE 2 TABS BY MOUTH EVERY MORNING AND 1 TAB EVERY EVENING 09/27/22   Gayland Lauraine PARAS, NP  Cholecalciferol  (VITAMIN D3) 1000 units CAPS Take 1,000 Units by mouth daily.    [provider]  Coral Calcium  1000 (390 Ca) MG TABS Take 1,000 mg by mouth daily.    [provider]  dapagliflozin propanediol (FARXIGA) 5 MG TABS tablet Take 5 mg by mouth daily.    [provider]  desvenlafaxine (PRISTIQ) 100 MG 24 hr tablet Take 100 mg by mouth in the morning. 02/04/20   [provider]  EPINEPHrine 0.3 mg/0.3 mL IJ SOAJ injection Inject 0.3 mg into the muscle as needed for anaphylaxis. 09/19/20   [provider]  ipratropium (ATROVENT) 0.03 % nasal spray Place 2 sprays into both nostrils 2 (two) times daily as needed (allergies).  04/29/19   [provider]  levETIRAcetam  (KEPPRA ) 500 MG tablet Take 1 tablet (500 mg total) by mouth 2 (two) times daily. 09/27/22   Gayland Lauraine PARAS, NP  metoprolol  tartrate (LOPRESSOR ) 25 MG tablet Take 1 tablet (25 mg total) by mouth 2 (two) times daily. 10/31/21   Regalado, Belkys A, MD  nitroGLYCERIN  (NITROSTAT ) 0.4 MG SL tablet  Place 0.4 mg under the tongue every 5 (five) minutes x 3 doses as needed for chest pain.    [provider]  ondansetron  (ZOFRAN ) 4 MG tablet Take 1 tablet (4 mg total) by mouth every 8 (eight) hours as needed for nausea or vomiting. 01/24/21   Brown, Blaine K, PA-C  triamcinolone  ointment (KENALOG ) 0.1 % Apply 1 application topically 2 (two) times daily. Patient taking differently: Apply 1 application  topically 2 (two) times daily as needed (skin rash/irritation.). 10/18/20   Iva Marty Saltness, MD      Allergies    Cinnamon, Meloxicam, Tape, Chlorhexidine , and Gabapentin     Review of Systems   Review of Systems   Gastrointestinal:  Positive for vomiting.    Physical Exam Updated Vital Signs BP (!) 133/57   Pulse 71   Temp 98.4 F (36.9 C) (Oral)   Resp 17   Wt 100.2 kg   SpO2 98%   BMI 36.78 kg/m  Physical Exam Vitals and nursing note reviewed.  Constitutional:      Appearance: She is well-developed.  HENT:     Head: Normocephalic and atraumatic.  Cardiovascular:     Rate and Rhythm: Normal rate and regular rhythm.  Pulmonary:     Effort: No respiratory distress.     Breath sounds: No stridor.  Abdominal:     General: There is no distension.  Musculoskeletal:     Cervical back: Normal range of motion.  Skin:    General: Skin is warm and dry.  Neurological:     General: No focal deficit present.     Mental Status: She is alert.     ED Results / Procedures / Treatments   Labs (all labs ordered are listed, but only abnormal results are displayed) Labs Reviewed  COMPREHENSIVE METABOLIC PANEL - Abnormal; Notable for the following components:      Result Value   Glucose, Bld 124 (*)    Creatinine, Ser 1.66 (*)    Total Protein 8.7 (*)    GFR, Estimated 34 (*)    All other components within normal limits  URINALYSIS, W/ REFLEX TO CULTURE (INFECTION SUSPECTED) - Abnormal; Notable for the following components:   APPearance HAZY (*)    Glucose, UA >1,000 (*)    Leukocytes,Ua SMALL (*)    Bacteria, UA RARE (*)    Crystals PRESENT (*)    All other components within normal limits  RESP PANEL BY RT-PCR (RSV, FLU A&B, COVID)  RVPGX2  URINE CULTURE  CBC WITH DIFFERENTIAL/PLATELET  LIPASE, BLOOD  CBG MONITORING, ED    EKG None  Radiology DG Chest 2 View Result Date: 02/23/2023 CLINICAL DATA:  Infection EXAM: CHEST - 2 VIEW COMPARISON:  X-ray and CTA 10/29/2021. FINDINGS: No consolidation, pneumothorax or effusion. Normal cardiopericardial silhouette without edema. Calcified aorta. Fixation hardware along the right clavicle. Degenerative changes of the spine. There is  moderate compression deformity of the midthoracic spine vertebral level, unchanged from previous. IMPRESSION: No acute cardiopulmonary disease. Electronically Signed   By: Ranell Bring M.D.   On: 02/23/2023 18:06    Procedures Procedures    Medications Ordered in ED Medications  ondansetron  (ZOFRAN ) injection 4 mg (4 mg Intravenous Given 02/24/23 0127)  sodium chloride  0.9 % bolus 1,000 mL (0 mLs Intravenous Stopped 02/24/23 0256)  cephALEXin  (KEFLEX ) capsule 1,000 mg (1,000 mg Oral Given 02/24/23 0300)    ED Course/ Medical Decision Making/ A&P  Medical Decision Making Amount and/or Complexity of Data Reviewed Labs: ordered. Radiology: ordered.  Risk Prescription drug management.   Overall patient's labs are reassuring however does appear that she probably has a UTI, culture will be sent we will start her on some antibiotics.  Low suspicion for pyelonephritis as her pain is not really consistent she does have tenderness over her kidneys or CVA area.  Pain is mostly on the left side posterior rib cage I suspect is more muscular from the throwing up.  She does not have any other respiratory symptoms.  Overall suspect she probably initially had a gastroenteritis of some sort and then got dehydrated made worse by the Ozempic I just still feels unwell because of that.  Initially wanted to attempt p.o. fluids which we did but she subsequently had emesis after the p.o. solids.  IV fluids and Zofran  given.  Subsequently able to tolerate p.o. liquids pretty well just does not have an appetite at this time which I think is okay.  I encouraged electrolyte and sugar in her drinks until her appetite came back.   Final Clinical Impression(s) / ED Diagnoses Final diagnoses:  Acute cystitis without hematuria  Nausea and vomiting, unspecified vomiting type  Dehydration    Rx / DC Orders ED Discharge Orders          Ordered    ondansetron  (ZOFRAN -ODT) 4 MG  disintegrating tablet  Every 8 hours PRN        02/24/23 0358    cephALEXin  (KEFLEX ) 500 MG capsule  4 times daily        02/24/23 0358              Donne Robillard, Selinda, MD 02/25/23 0207

## 2023-02-25 ENCOUNTER — Other Ambulatory Visit: Payer: Self-pay

## 2023-02-25 ENCOUNTER — Emergency Department (HOSPITAL_COMMUNITY): Payer: Medicare Other

## 2023-02-25 ENCOUNTER — Observation Stay (HOSPITAL_COMMUNITY)
Admission: EM | Admit: 2023-02-25 | Discharge: 2023-03-01 | Disposition: A | Discharge status: Home / Self Care | Payer: Medicare Other | Attending: Student | Admitting: Student

## 2023-02-25 DIAGNOSIS — E119 Type 2 diabetes mellitus without complications: Secondary | ICD-10-CM

## 2023-02-25 DIAGNOSIS — R61 Generalized hyperhidrosis: Secondary | ICD-10-CM | POA: Diagnosis not present

## 2023-02-25 DIAGNOSIS — I959 Hypotension, unspecified: Secondary | ICD-10-CM | POA: Diagnosis not present

## 2023-02-25 DIAGNOSIS — N1832 Chronic kidney disease, stage 3b: Secondary | ICD-10-CM | POA: Diagnosis not present

## 2023-02-25 DIAGNOSIS — Z7984 Long term (current) use of oral hypoglycemic drugs: Secondary | ICD-10-CM | POA: Insufficient documentation

## 2023-02-25 DIAGNOSIS — N179 Acute kidney failure, unspecified: Secondary | ICD-10-CM

## 2023-02-25 DIAGNOSIS — Z7982 Long term (current) use of aspirin: Secondary | ICD-10-CM | POA: Diagnosis not present

## 2023-02-25 DIAGNOSIS — R112 Nausea with vomiting, unspecified: Secondary | ICD-10-CM

## 2023-02-25 DIAGNOSIS — R109 Unspecified abdominal pain: Secondary | ICD-10-CM | POA: Diagnosis not present

## 2023-02-25 DIAGNOSIS — R42 Dizziness and giddiness: Secondary | ICD-10-CM | POA: Diagnosis not present

## 2023-02-25 DIAGNOSIS — I951 Orthostatic hypotension: Secondary | ICD-10-CM | POA: Diagnosis not present

## 2023-02-25 DIAGNOSIS — N390 Urinary tract infection, site not specified: Secondary | ICD-10-CM | POA: Diagnosis not present

## 2023-02-25 DIAGNOSIS — Z1152 Encounter for screening for COVID-19: Secondary | ICD-10-CM | POA: Insufficient documentation

## 2023-02-25 DIAGNOSIS — E876 Hypokalemia: Secondary | ICD-10-CM

## 2023-02-25 DIAGNOSIS — I129 Hypertensive chronic kidney disease with stage 1 through stage 4 chronic kidney disease, or unspecified chronic kidney disease: Secondary | ICD-10-CM | POA: Diagnosis not present

## 2023-02-25 DIAGNOSIS — Z96642 Presence of left artificial hip joint: Secondary | ICD-10-CM | POA: Insufficient documentation

## 2023-02-25 DIAGNOSIS — K7689 Other specified diseases of liver: Secondary | ICD-10-CM | POA: Diagnosis not present

## 2023-02-25 DIAGNOSIS — R55 Syncope and collapse: Principal | ICD-10-CM

## 2023-02-25 DIAGNOSIS — K5730 Diverticulosis of large intestine without perforation or abscess without bleeding: Secondary | ICD-10-CM | POA: Diagnosis not present

## 2023-02-25 DIAGNOSIS — R000 Tachycardia, unspecified: Secondary | ICD-10-CM | POA: Insufficient documentation

## 2023-02-25 DIAGNOSIS — K921 Melena: Secondary | ICD-10-CM

## 2023-02-25 DIAGNOSIS — Z79899 Other long term (current) drug therapy: Secondary | ICD-10-CM | POA: Insufficient documentation

## 2023-02-25 DIAGNOSIS — I10 Essential (primary) hypertension: Secondary | ICD-10-CM | POA: Diagnosis not present

## 2023-02-25 DIAGNOSIS — R197 Diarrhea, unspecified: Secondary | ICD-10-CM | POA: Diagnosis not present

## 2023-02-25 LAB — RESP PANEL BY RT-PCR (RSV, FLU A&B, COVID)  RVPGX2
Influenza A by PCR: NEGATIVE
Influenza B by PCR: NEGATIVE
Resp Syncytial Virus by PCR: NEGATIVE
SARS Coronavirus 2 by RT PCR: NEGATIVE

## 2023-02-25 LAB — CBC
HCT: 37.9 % (ref 36.0–46.0)
Hemoglobin: 12.3 g/dL (ref 12.0–15.0)
MCH: 30.5 pg (ref 26.0–34.0)
MCHC: 32.5 g/dL (ref 30.0–36.0)
MCV: 94 fL (ref 80.0–100.0)
Platelets: 204 10*3/uL (ref 150–400)
RBC: 4.03 MIL/uL (ref 3.87–5.11)
RDW: 12.3 % (ref 11.5–15.5)
WBC: 9.4 10*3/uL (ref 4.0–10.5)
nRBC: 0 % (ref 0.0–0.2)

## 2023-02-25 LAB — BASIC METABOLIC PANEL
Anion gap: 12 (ref 5–15)
BUN: 20 mg/dL (ref 8–23)
CO2: 24 mmol/L (ref 22–32)
Calcium: 8.7 mg/dL — ABNORMAL LOW (ref 8.9–10.3)
Chloride: 99 mmol/L (ref 98–111)
Creatinine, Ser: 1.91 mg/dL — ABNORMAL HIGH (ref 0.44–1.00)
GFR, Estimated: 29 mL/min — ABNORMAL LOW (ref >60)
Glucose, Bld: 125 mg/dL — ABNORMAL HIGH (ref 70–99)
Potassium: 3.3 mmol/L — ABNORMAL LOW (ref 3.5–5.1)
Sodium: 135 mmol/L (ref 135–145)

## 2023-02-25 LAB — CBG MONITORING, ED
Glucose-Capillary: 88 mg/dL (ref 70–99)
Glucose-Capillary: 112 mg/dL — ABNORMAL HIGH (ref 70–99)

## 2023-02-25 LAB — TROPONIN I (HIGH SENSITIVITY): Troponin I (High Sensitivity): 8 ng/L (ref <18)

## 2023-02-25 MED ORDER — ONDANSETRON HCL 4 MG/2ML IJ SOLN
4.0000 mg | Freq: Once | INTRAMUSCULAR | Status: AC
  Administered 2023-02-25: 4 mg via INTRAVENOUS
  Filled 2023-02-25: qty 2

## 2023-02-25 MED ORDER — SODIUM CHLORIDE 0.9 % IV BOLUS
1000.0000 mL | Freq: Once | INTRAVENOUS | Status: AC
  Administered 2023-02-25: 1000 mL via INTRAVENOUS

## 2023-02-25 NOTE — ED Provider Triage Note (Signed)
 Emergency Medicine Provider Triage Evaluation Note  Summer [name] , a 68 y.o. female  was evaluated in triage.  Pt complains of syncope.  Review of Systems  Positive: Nausea, syncope, lightheaded Negative: Chest pain, fever  Physical Exam  BP 132/81 (BP Location: Left Arm)   Pulse 65   Temp 98.8 F (37.1 C) (Oral)   Resp 17   Ht 5' 5 (1.651 m)   Wt 100.2 kg   SpO2 100%   BMI 36.78 kg/m  Gen:   Awake, no distress   Resp:  Normal effort  MSK:   Moves extremities without difficulty  Other:    Medical Decision Making  Medically screening exam initiated at 7:25 PM.  Appropriate orders placed.  Summer [name] was informed that the remainder of the evaluation will be completed by another provider, this initial triage assessment does not replace that evaluation, and the importance of remaining in the ED until their evaluation is complete.  Patient recently started on Ozempic, stopped due to N, V. Today had syncopal episode while in the bathroom - refused EMS transport at that time. Then had a near syncopal episode and allowed transport to ED.    Odell Balls, PA-C 02/25/23 1929

## 2023-02-25 NOTE — ED Triage Notes (Signed)
 Pt BIBA from home. C/o syncopal episode after using the restroom. Pt refused EMS initially and then had near syncopal event  PT had recently started Ozempic, but was taken off it d/t n/v

## 2023-02-25 NOTE — ED Notes (Signed)
 PT now have Blood in stool

## 2023-02-25 NOTE — ED Notes (Signed)
 BP lying 149/60 (85) Standing 115/62 (77)

## 2023-02-25 NOTE — ED Provider Notes (Signed)
 [place] EMERGENCY DEPARTMENT AT [facility] Provider Note   CSN: [id number] Arrival date & time: 02/25/23  1854     History  Chief Complaint  Patient presents with   Near Syncope    Summer [name] is a 68 y.o. female.  Patient here after near syncopal episode.  She has been having nausea and vomiting for the last couple days.  Unable to tolerate p.o.  She has had some constipation now.  She has not had any diarrhea.  He is felt very weak and dehydrated.  She almost passed out while trying to use the bathroom earlier tonight and happened again while in the waiting room.  She has a history of hypertension high cholesterol diabetes.  There is maybe some blood in her stool/toilet paper.  She has history of hypertension high cholesterol CKD.  She has not been able to eat or drink much.  She denies any chest pain shortness of breath cough or sputum production.  She has had some lower abdominal pain.  The history is provided by the patient.       Home Medications Prior to Admission medications   Medication Sig Start Date End Date Taking? Authorizing Provider  acetaminophen  (TYLENOL ) 500 MG tablet Take 1,000 mg by mouth every 6 (six) hours as needed for mild pain.    [provider]  ALPRAZolam  (XANAX ) 0.25 MG tablet Take 0.125-0.25 mg by mouth daily as needed for anxiety.    [provider]  amLODipine  (NORVASC ) 2.5 MG tablet Take 1 tablet (2.5 mg total) by mouth daily. 11/01/21   Regalado, Belkys A, MD  aspirin  EC 81 MG tablet Take 81 mg by mouth in the morning. Swallow whole.    [provider]  atorvastatin  (LIPITOR) 40 MG tablet Take 40 mg by mouth every evening. 03/20/19   [provider]  buPROPion  (WELLBUTRIN  XL) 150 MG 24 hr tablet Take 150 mg by mouth every morning. 03/01/22   [provider]  carbamazepine  (TEGRETOL ) 200 MG tablet TAKE 2 TABS BY MOUTH EVERY MORNING AND 1 TAB EVERY EVENING 09/27/22   Gayland Lauraine PARAS, NP   cephALEXin  (KEFLEX ) 500 MG capsule Take 1 capsule (500 mg total) by mouth 4 (four) times daily. 02/24/23   Mesner, Selinda, MD  Cholecalciferol  (VITAMIN D3) 1000 units CAPS Take 1,000 Units by mouth daily.    [provider]  Coral Calcium  1000 (390 Ca) MG TABS Take 1,000 mg by mouth daily.    [provider]  dapagliflozin propanediol (FARXIGA) 5 MG TABS tablet Take 5 mg by mouth daily.    [provider]  desvenlafaxine (PRISTIQ) 100 MG 24 hr tablet Take 100 mg by mouth in the morning. 02/04/20   [provider]  EPINEPHrine 0.3 mg/0.3 mL IJ SOAJ injection Inject 0.3 mg into the muscle as needed for anaphylaxis. 09/19/20   [provider]  ipratropium (ATROVENT) 0.03 % nasal spray Place 2 sprays into both nostrils 2 (two) times daily as needed (allergies).  04/29/19   [provider]  levETIRAcetam  (KEPPRA ) 500 MG tablet Take 1 tablet (500 mg total) by mouth 2 (two) times daily. 09/27/22   Gayland Lauraine PARAS, NP  metoprolol  tartrate (LOPRESSOR ) 25 MG tablet Take 1 tablet (25 mg total) by mouth 2 (two) times daily. 10/31/21   Regalado, Belkys A, MD  nitroGLYCERIN  (NITROSTAT ) 0.4 MG SL tablet Place 0.4 mg under the tongue every 5 (five) minutes x 3 doses as needed for chest pain.  [provider]  ondansetron  (ZOFRAN ) 4 MG tablet Take 1 tablet (4 mg total) by mouth every 8 (eight) hours as needed for nausea or vomiting. 01/24/21   Brown, Blaine K, PA-C  ondansetron  (ZOFRAN -ODT) 4 MG disintegrating tablet Take 1 tablet (4 mg total) by mouth every 8 (eight) hours as needed for vomiting. 02/24/23   Mesner, Selinda, MD  triamcinolone  ointment (KENALOG ) 0.1 % Apply 1 application topically 2 (two) times daily. Patient taking differently: Apply 1 application  topically 2 (two) times daily as needed (skin rash/irritation.). 10/18/20   Iva Marty Saltness, MD      Allergies    Cinnamon, Meloxicam, Tape, Chlorhexidine , and Gabapentin     Review of Systems    Review of Systems  Physical Exam Updated Vital Signs  ED Triage Vitals  Encounter Vitals Group     BP 02/25/23 1921 132/81     Systolic BP Percentile --      Diastolic BP Percentile --      Pulse Rate 02/25/23 1921 65     Resp 02/25/23 1921 17     Temp 02/25/23 1921 98.8 F (37.1 C)     Temp Source 02/25/23 1921 Oral     SpO2 02/25/23 1906 98 %     Weight 02/25/23 1912 220 lb 7.4 oz (100 kg)     Height 02/25/23 1912 5' 5 (1.651 m)     Head Circumference --      Peak Flow --      Pain Score 02/25/23 1912 0     Pain Loc --      Pain Education --      Exclude from Growth Chart --     Physical Exam Vitals and nursing note reviewed.  Constitutional:      General: She is not in acute distress.    Appearance: She is well-developed. She is not ill-appearing.  HENT:     Head: Normocephalic and atraumatic.     Nose: Nose normal.     Mouth/Throat:     Mouth: Mucous membranes are dry.  Eyes:     Extraocular Movements: Extraocular movements intact.     Conjunctiva/sclera: Conjunctivae normal.     Pupils: Pupils are equal, round, and reactive to light.  Cardiovascular:     Rate and Rhythm: Normal rate and regular rhythm.     Pulses: Normal pulses.     Heart sounds: Normal heart sounds. No murmur heard. Pulmonary:     Effort: Pulmonary effort is normal. No respiratory distress.     Breath sounds: Normal breath sounds.  Abdominal:     General: Abdomen is flat.     Palpations: Abdomen is soft.     Tenderness: There is abdominal tenderness.  Musculoskeletal:        General: No swelling.     Cervical back: Normal range of motion and neck supple.  Skin:    General: Skin is warm and dry.     Capillary Refill: Capillary refill takes less than 2 seconds.  Neurological:     General: No focal deficit present.     Mental Status: She is alert.  Psychiatric:        Mood and Affect: Mood normal.     ED Results / Procedures / Treatments   Labs (all labs ordered are listed, but  only abnormal results are displayed) Labs Reviewed  BASIC METABOLIC PANEL - Abnormal; Notable for the following components:      Result Value   Potassium 3.3 (*)  Glucose, Bld 125 (*)    Creatinine, Ser 1.91 (*)    Calcium  8.7 (*)    GFR, Estimated 29 (*)    All other components within normal limits  CBG MONITORING, ED - Abnormal; Notable for the following components:   Glucose-Capillary 112 (*)    All other components within normal limits  RESP PANEL BY RT-PCR (RSV, FLU A&B, COVID)  RVPGX2  CBC  URINALYSIS, ROUTINE W REFLEX MICROSCOPIC  CBG MONITORING, ED  POC OCCULT BLOOD, ED  TROPONIN I (HIGH SENSITIVITY)    EKG EKG Interpretation Date/Time:  Monday February 25 2023 19:22:54 EST Ventricular Rate:  66 PR Interval:  163 QRS Duration:  100 QT Interval:  443 QTC Calculation: 465 R Axis:   29  Text Interpretation: Sinus rhythm Abnormal R-wave progression, early transition Nonspecific T abnormalities, anterior leads Confirmed by Ruthe Cornet [phone]) on 02/25/2023 8:41:18 PM  Radiology CT ABDOMEN PELVIS WO CONTRAST Result Date: 02/25/2023 CLINICAL DATA:  Abdominal pain, acute, nonlocalized.  Syncope EXAM: CT ABDOMEN AND PELVIS WITHOUT CONTRAST TECHNIQUE: Multidetector CT imaging of the abdomen and pelvis was performed following the standard protocol without IV contrast. RADIATION DOSE REDUCTION: This exam was performed according to the departmental dose-optimization program which includes automated exposure control, adjustment of the mA and/or kV according to patient size and/or use of iterative reconstruction technique. COMPARISON:  01/31/2022 FINDINGS: Lower chest: No acute abnormality. Hepatobiliary: Stable small cyst in the left hepatic lobe. No suspicious hepatic abnormality. Gallbladder unremarkable. No biliary ductal dilatation. Pancreas: No focal abnormality or ductal dilatation. Spleen: No focal abnormality.  Normal size. Adrenals/Urinary Tract: No adrenal abnormality. No  focal renal abnormality. No stones or hydronephrosis. Urinary bladder is unremarkable. Stomach/Bowel: Left colonic diverticulosis. No active diverticulitis. Stomach and small bowel decompressed, unremarkable. Vascular/Lymphatic: No evidence of aneurysm or adenopathy. Reproductive: Prior hysterectomy.  No adnexal masses. Other:  No free fluid or free air. Musculoskeletal: Prior left hip replacement. No acute bony abnormality. IMPRESSION: No acute findings in the abdomen or pelvis. Left colonic diverticulosis. Electronically Signed   By: Franky Crease M.D.   On: 02/25/2023 21:09    Procedures Procedures    Medications Ordered in ED Medications  sodium chloride  0.9 % bolus 1,000 mL (has no administration in time range)  ondansetron  (ZOFRAN ) injection 4 mg (has no administration in time range)    ED Course/ Medical Decision Making/ A&P                                 Medical Decision Making Amount and/or Complexity of Data Reviewed Labs: ordered. Radiology: ordered.  Risk Prescription drug management.   [name] is here with ongoing nausea vomiting weakness.  She has not been able to tolerate p.o. very well the last few days.  Seen they would be thought to be secondary to Ozempic.  She seen a couple days ago in the ED got fluids and went home but she has been having lightheaded episodes with changing positions.  She then was passed out on the toilet bowl today and had another episode in the ED as well.  She keeps having near syncopal episodes.  She has felt decreased stool output she has not had any diarrhea.  She has had some lower abdominal pain.  She is just very weak and unable to really tolerate p.o. and now having episodes of almost passing out.  She has a history of hypertension high cholesterol reflux.  History of diabetes as well.  Overall differential diagnosis could be ongoing viral process versus dehydration secondary to maybe Ozempic use, seems less likely to be infectious  process.  She will get labs including CT scan abdomen and pelvis.  She is already been given fluid bolus with EMS and still remains orthostatic here.  Blood pressure 145 systolic and dropped to 110 systolic standing.  She is symptomatic with this as well.  Overall lab work shows slight increase in her creatinine to now 1.9.  She noticed some blood on toilet paper today.  Her stool looks grossly brown but Hemoccult is positive.  No significant leukocytosis or anemia per my further review of her labs her kidney functions now increased from 1.3 baseline to 1.9.  She has a CT scan that was overall unremarkable.  She still orthostatic near syncopal episodes looks dehydrated not tolerating p.o. and think she benefit from observation stay for hydration and further care.  Her EKG shows sinus rhythm.  She has some chronic T wave inversions anteriorly but will get a troponin to further evaluate but is not having any chest pain.  Doubt cardiac process.  If anything is likely some stress secondary to dehydration.  Otherwise urinalysis is pending as well.  Will admit to medicine.  This chart was dictated using voice recognition software.  Despite best efforts to proofread,  errors can occur which can change the documentation meaning.         Final Clinical Impression(s) / ED Diagnoses Final diagnoses:  Near syncope  Orthostatic hypotension  AKI (acute kidney injury) (HCC)  Nausea and vomiting, unspecified vomiting type  Blood in stool    Rx / DC Orders ED Discharge Orders     None         Ruthe Cornet, DO 02/25/23 2124

## 2023-02-26 ENCOUNTER — Encounter (HOSPITAL_COMMUNITY): Payer: Self-pay | Admitting: Internal Medicine

## 2023-02-26 DIAGNOSIS — I951 Orthostatic hypotension: Secondary | ICD-10-CM

## 2023-02-26 DIAGNOSIS — R55 Syncope and collapse: Secondary | ICD-10-CM | POA: Diagnosis not present

## 2023-02-26 DIAGNOSIS — R112 Nausea with vomiting, unspecified: Secondary | ICD-10-CM

## 2023-02-26 DIAGNOSIS — N390 Urinary tract infection, site not specified: Secondary | ICD-10-CM

## 2023-02-26 DIAGNOSIS — N179 Acute kidney failure, unspecified: Secondary | ICD-10-CM

## 2023-02-26 DIAGNOSIS — E876 Hypokalemia: Secondary | ICD-10-CM

## 2023-02-26 LAB — URINALYSIS, ROUTINE W REFLEX MICROSCOPIC
Bacteria, UA: NONE SEEN
Bilirubin Urine: NEGATIVE
Glucose, UA: >=500 mg/dL — AB
Hgb urine dipstick: NEGATIVE
Ketones, ur: NEGATIVE mg/dL
Leukocytes,Ua: NEGATIVE
Nitrite: NEGATIVE
Protein, ur: NEGATIVE mg/dL
Specific Gravity, Urine: 1.009 (ref 1.005–1.030)
pH: 5 (ref 5.0–8.0)

## 2023-02-26 LAB — CBC
HCT: 34.1 % — ABNORMAL LOW (ref 36.0–46.0)
Hemoglobin: 11 g/dL — ABNORMAL LOW (ref 12.0–15.0)
MCH: 30.1 pg (ref 26.0–34.0)
MCHC: 32.3 g/dL (ref 30.0–36.0)
MCV: 93.2 fL (ref 80.0–100.0)
Platelets: 196 10*3/uL (ref 150–400)
RBC: 3.66 MIL/uL — ABNORMAL LOW (ref 3.87–5.11)
RDW: 12.7 % (ref 11.5–15.5)
WBC: 7.7 10*3/uL (ref 4.0–10.5)
nRBC: 0 % (ref 0.0–0.2)

## 2023-02-26 LAB — BASIC METABOLIC PANEL
Anion gap: 10 (ref 5–15)
BUN: 18 mg/dL (ref 8–23)
CO2: 22 mmol/L (ref 22–32)
Calcium: 7.8 mg/dL — ABNORMAL LOW (ref 8.9–10.3)
Chloride: 102 mmol/L (ref 98–111)
Creatinine, Ser: 1.6 mg/dL — ABNORMAL HIGH (ref 0.44–1.00)
GFR, Estimated: 36 mL/min — ABNORMAL LOW (ref >60)
Glucose, Bld: 105 mg/dL — ABNORMAL HIGH (ref 70–99)
Potassium: 3.1 mmol/L — ABNORMAL LOW (ref 3.5–5.1)
Sodium: 134 mmol/L — ABNORMAL LOW (ref 135–145)

## 2023-02-26 LAB — GLUCOSE, CAPILLARY
Glucose-Capillary: 111 mg/dL — ABNORMAL HIGH (ref 70–99)
Glucose-Capillary: 126 mg/dL — ABNORMAL HIGH (ref 70–99)

## 2023-02-26 LAB — CBG MONITORING, ED
Glucose-Capillary: 107 mg/dL — ABNORMAL HIGH (ref 70–99)
Glucose-Capillary: 111 mg/dL — ABNORMAL HIGH (ref 70–99)

## 2023-02-26 LAB — URINE CULTURE: Culture: >=100000 — AB

## 2023-02-26 LAB — TROPONIN I (HIGH SENSITIVITY): Troponin I (High Sensitivity): 7 ng/L (ref <18)

## 2023-02-26 LAB — HIV ANTIBODY (ROUTINE TESTING W REFLEX): HIV Screen 4th Generation wRfx: NONREACTIVE

## 2023-02-26 LAB — MAGNESIUM: Magnesium: 2.3 mg/dL (ref 1.7–2.4)

## 2023-02-26 MED ORDER — SODIUM CHLORIDE 0.9 % IV SOLN
INTRAVENOUS | Status: AC
Start: 2023-02-26 — End: 2023-02-26

## 2023-02-26 MED ORDER — SODIUM CHLORIDE 0.9 % IV SOLN
1.0000 g | INTRAVENOUS | Status: DC
  Administered 2023-02-26 – 2023-02-27 (×2): 1 g via INTRAVENOUS
  Filled 2023-02-26 (×2): qty 10

## 2023-02-26 MED ORDER — CARBAMAZEPINE 200 MG PO TABS
200.0000 mg | ORAL_TABLET | Freq: Every day | ORAL | Status: DC
  Administered 2023-02-26 – 2023-02-28 (×3): 200 mg via ORAL
  Filled 2023-02-26 (×4): qty 1

## 2023-02-26 MED ORDER — ACETAMINOPHEN 325 MG PO TABS: 650.0000 mg | ORAL_TABLET | Freq: Four times a day (QID) | ORAL | Status: DC | PRN

## 2023-02-26 MED ORDER — ALPRAZOLAM 0.25 MG PO TABS: 0.1250 mg | ORAL_TABLET | Freq: Every day | ORAL | Status: DC | PRN

## 2023-02-26 MED ORDER — ATORVASTATIN CALCIUM 40 MG PO TABS
40.0000 mg | ORAL_TABLET | Freq: Every evening | ORAL | Status: DC
  Administered 2023-02-26 – 2023-02-28 (×3): 40 mg via ORAL
  Filled 2023-02-26 (×3): qty 1

## 2023-02-26 MED ORDER — INSULIN ASPART 100 UNIT/ML IJ SOLN
0.0000 [IU] | Freq: Three times a day (TID) | INTRAMUSCULAR | Status: DC
  Filled 2023-02-26: qty 0.09

## 2023-02-26 MED ORDER — POTASSIUM CHLORIDE CRYS ER 20 MEQ PO TBCR
40.0000 meq | EXTENDED_RELEASE_TABLET | Freq: Once | ORAL | Status: AC
  Administered 2023-02-26: 40 meq via ORAL
  Filled 2023-02-26: qty 2

## 2023-02-26 MED ORDER — VENLAFAXINE HCL ER 150 MG PO CP24
150.0000 mg | ORAL_CAPSULE | Freq: Every day | ORAL | Status: DC
  Administered 2023-02-26 – 2023-03-01 (×4): 150 mg via ORAL
  Filled 2023-02-26: qty 2
  Filled 2023-02-26 (×3): qty 1

## 2023-02-26 MED ORDER — ACETAMINOPHEN 650 MG RE SUPP: 650.0000 mg | Freq: Four times a day (QID) | RECTAL | Status: DC | PRN

## 2023-02-26 MED ORDER — MECLIZINE HCL 25 MG PO TABS
12.5000 mg | ORAL_TABLET | Freq: Once | ORAL | Status: AC
  Administered 2023-02-26: 12.5 mg via ORAL
  Filled 2023-02-26: qty 1

## 2023-02-26 MED ORDER — BUPROPION HCL ER (XL) 150 MG PO TB24
150.0000 mg | ORAL_TABLET | Freq: Every morning | ORAL | Status: DC
  Administered 2023-02-26 – 2023-03-01 (×4): 150 mg via ORAL
  Filled 2023-02-26 (×4): qty 1

## 2023-02-26 MED ORDER — INSULIN ASPART 100 UNIT/ML IJ SOLN
0.0000 [IU] | Freq: Every day | INTRAMUSCULAR | Status: DC
  Filled 2023-02-26: qty 0.05

## 2023-02-26 MED ORDER — ONDANSETRON HCL 4 MG/2ML IJ SOLN
4.0000 mg | Freq: Once | INTRAMUSCULAR | Status: AC
  Administered 2023-02-26: 4 mg via INTRAVENOUS
  Filled 2023-02-26: qty 2

## 2023-02-26 MED ORDER — POTASSIUM CHLORIDE 20 MEQ PO PACK
40.0000 meq | PACK | Freq: Once | ORAL | Status: AC
  Administered 2023-02-26: 40 meq via ORAL
  Filled 2023-02-26: qty 2

## 2023-02-26 MED ORDER — LEVETIRACETAM 500 MG PO TABS
500.0000 mg | ORAL_TABLET | Freq: Two times a day (BID) | ORAL | Status: DC
  Administered 2023-02-26 – 2023-03-01 (×7): 500 mg via ORAL
  Filled 2023-02-26 (×8): qty 1
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
## 2023-02-26 NOTE — ED Notes (Signed)
 ED TO INPATIENT HANDOFF REPORT  Name/Age/Gender Summer [name] 68 y.o. female  Code Status    Code Status Orders  (From admission, onward)           Start     Ordered   02/26/23 0327  Full code  Continuous       Question:  By:  Answer:  Consent: discussion documented in EHR   02/26/23 0328           Code Status History     Date Active Date Inactive Code Status Order ID Comments User Context   10/30/2021 1331 10/31/2021 2135 Full Code [id number]  Lanetta Lingo, MD Inpatient   01/17/2016 1727 01/20/2016 1853 Full Code [id number]  Josefina Chew, MD Inpatient   12/15/2012 1613 12/16/2012 1150 Full Code [id number]  Davia Nydia POUR, MD Inpatient   03/01/2012 1821 03/03/2012 1645 Full Code [id number]  Zachary Lynnea HERO, RN Inpatient       Home/SNF/Other Home  Chief Complaint Near syncope [R55]  Level of Care/Admitting Diagnosis ED Disposition     ED Disposition  Admit   Condition  --   Comment  Hospital Area: [facility] [[id number]]  Level of Care: Telemetry [5]  Admit to tele based on following criteria: Eval of Syncope  May place patient in observation at [facility] or [facility] if equivalent level of care is available:: Yes  Covid Evaluation: Asymptomatic - no recent exposure (last 10 days) testing not required  Diagnosis: Near syncope [692301]  Admitting Physician: ALFORNIA MADISON [[id number]]  Attending Physician: ALFORNIA MADISON [[id number]]          Medical History Past Medical History:  Diagnosis Date   Anxiety    Arthritis    Atypical facial pain 09/08/2012   Avascular necrosis of bone of left hip (HCC) 01/17/2016   Benign positional vertigo 07/17/2019   Blindness of left eye with normal vision in contralateral eye    Depression    Diabetes mellitus without complication (HCC)    Difficult intubation    eye surgery prior to 2013- [facility]   Dizzy spells    GERD (gastroesophageal reflux disease)    At times, does  not take anything   Headache(784.0)    Hyperlipemia    Hypertension    Lumbar vertebral fracture (HCC)    Obesity    Retinal detachment    Status post scleral buckle   Tachycardia, unspecified     Allergies Allergies  Allergen Reactions   Cinnamon     Other reaction(s): lip/throat swelling   Meloxicam     Other reaction(s): dizzy   Tape Other (See Comments)    Plastic tape only.  REACTION:  Skin redness.   Chlorhexidine  Itching    (Irritation) Skin redness   Gabapentin  Anxiety    IV Location/Drains/Wounds Patient Lines/Drains/Airways Status     Active Line/Drains/Airways     Name Placement date Placement time Site Days   Peripheral IV 02/25/23 20 G Right Antecubital 02/25/23  2127  Antecubital  1   Peripheral IV 02/26/23 20 G Left Antecubital 02/26/23  0303  Antecubital  less than 1            Labs/Imaging Results for orders placed or performed during the hospital encounter of 02/25/23 (from the past 48 hours)  Troponin I (High Sensitivity)     Status: None   Collection Time: 02/25/23  2:16 AM  Result Value Ref Range   Troponin I (High Sensitivity) 7 <18 ng/L  Comment: (NOTE) Elevated high sensitivity troponin I (hsTnI) values and significant  changes across serial measurements may suggest ACS but many other  chronic and acute conditions are known to elevate hsTnI results.  Refer to the Links section for chest pain algorithms and additional  guidance. Performed at [facility], [address] [place], [place] [address]   CBG monitoring, ED     Status: None   Collection Time: 02/25/23  7:30 PM  Result Value Ref Range   Glucose-Capillary 88 70 - 99 mg/dL    Comment: Glucose reference range applies only to samples taken after fasting for at least 8 hours.  Basic metabolic panel     Status: Abnormal   Collection Time: 02/25/23  7:36 PM  Result Value Ref Range   Sodium 135 135 - 145 mmol/L   Potassium 3.3 (L) 3.5 - 5.1 mmol/L    Chloride 99 98 - 111 mmol/L   CO2 24 22 - 32 mmol/L   Glucose, Bld 125 (H) 70 - 99 mg/dL    Comment: Glucose reference range applies only to samples taken after fasting for at least 8 hours.   BUN 20 8 - 23 mg/dL   Creatinine, Ser 8.08 (H) 0.44 - 1.00 mg/dL   Calcium  8.7 (L) 8.9 - 10.3 mg/dL   GFR, Estimated 29 (L) >60 mL/min    Comment: (NOTE) Calculated using the CKD-EPI Creatinine Equation (2021)    Anion gap 12 5 - 15    Comment: Performed at [facility], [address], [place], [place] [address]  CBC     Status: None   Collection Time: 02/25/23  7:36 PM  Result Value Ref Range   WBC 9.4 4.0 - 10.5 K/uL   RBC 4.03 3.87 - 5.11 MIL/uL   Hemoglobin 12.3 12.0 - 15.0 g/dL   HCT 62.0 63.9 - 53.9 %   MCV 94.0 80.0 - 100.0 fL   MCH 30.5 26.0 - 34.0 pg   MCHC 32.5 30.0 - 36.0 g/dL   RDW 87.6 88.4 - 84.4 %   Platelets 204 150 - 400 K/uL   nRBC 0.0 0.0 - 0.2 %    Comment: Performed at [facility], [address] [place], [place] [address]  CBG monitoring, ED     Status: Abnormal   Collection Time: 02/25/23  8:39 PM  Result Value Ref Range   Glucose-Capillary 112 (H) 70 - 99 mg/dL    Comment: Glucose reference range applies only to samples taken after fasting for at least 8 hours.  Troponin I (High Sensitivity)     Status: None   Collection Time: 02/25/23  8:51 PM  Result Value Ref Range   Troponin I (High Sensitivity) 8 <18 ng/L    Comment: (NOTE) Elevated high sensitivity troponin I (hsTnI) values and significant  changes across serial measurements may suggest ACS but many other  chronic and acute conditions are known to elevate hsTnI results.  Refer to the Links section for chest pain algorithms and additional  guidance. Performed at [facility], [address] [place], [place] [address]   Resp panel by RT-PCR (RSV, Flu A&B, Covid) Anterior Nasal Swab     Status: None   Collection Time: 02/25/23  9:09 PM    Specimen: Anterior Nasal Swab  Result Value Ref Range   SARS Coronavirus 2 by RT PCR NEGATIVE NEGATIVE    Comment: (NOTE) SARS-CoV-2 target nucleic acids are NOT DETECTED.  The SARS-CoV-2 RNA  is generally detectable in upper respiratory specimens during the acute phase of infection. The lowest concentration of SARS-CoV-2 viral copies this assay can detect is 138 copies/mL. A negative result does not preclude SARS-Cov-2 infection and should not be used as the sole basis for treatment or other patient management decisions. A negative result may occur with  improper specimen collection/handling, submission of specimen other than nasopharyngeal swab, presence of viral mutation(s) within the areas targeted by this assay, and inadequate number of viral copies(<138 copies/mL). A negative result must be combined with clinical observations, patient history, and epidemiological information. The expected result is Negative.  Fact Sheet for Patients:  bloggercourse.com  Fact Sheet for Healthcare Providers:  seriousbroker.it  This test is no t yet approved or cleared by the [place]  FDA and  has been authorized for detection and/or diagnosis of SARS-CoV-2 by FDA under an Emergency Use Authorization (EUA). This EUA will remain  in effect (meaning this test can be used) for the duration of the COVID-19 declaration under Section 564(b)(1) of the Act, 21 U.S.C.section 360bbb-3(b)(1), unless the authorization is terminated  or revoked sooner.       Influenza A by PCR NEGATIVE NEGATIVE   Influenza B by PCR NEGATIVE NEGATIVE    Comment: (NOTE) The Xpert Xpress SARS-CoV-2/FLU/RSV plus assay is intended as an aid in the diagnosis of influenza from Nasopharyngeal swab specimens and should not be used as a sole basis for treatment. Nasal washings and aspirates are unacceptable for Xpert Xpress SARS-CoV-2/FLU/RSV testing.  Fact Sheet for  Patients: bloggercourse.com  Fact Sheet for Healthcare Providers: seriousbroker.it  This test is not yet approved or cleared by the [place]  FDA and has been authorized for detection and/or diagnosis of SARS-CoV-2 by FDA under an Emergency Use Authorization (EUA). This EUA will remain in effect (meaning this test can be used) for the duration of the COVID-19 declaration under Section 564(b)(1) of the Act, 21 U.S.C. section 360bbb-3(b)(1), unless the authorization is terminated or revoked.     Resp Syncytial Virus by PCR NEGATIVE NEGATIVE    Comment: (NOTE) Fact Sheet for Patients: bloggercourse.com  Fact Sheet for Healthcare Providers: seriousbroker.it  This test is not yet approved or cleared by the [place]  FDA and has been authorized for detection and/or diagnosis of SARS-CoV-2 by FDA under an Emergency Use Authorization (EUA). This EUA will remain in effect (meaning this test can be used) for the duration of the COVID-19 declaration under Section 564(b)(1) of the Act, 21 U.S.C. section 360bbb-3(b)(1), unless the authorization is terminated or revoked.  Performed at [facility], [address], [place], [place] [address]   Urinalysis, Routine w reflex microscopic -Urine, Clean Catch     Status: Abnormal   Collection Time: 02/26/23  4:00 AM  Result Value Ref Range   Color, Urine STRAW (A) YELLOW   APPearance CLEAR CLEAR   Specific Gravity, Urine 1.009 1.005 - 1.030   pH 5.0 5.0 - 8.0   Glucose, UA >=500 (A) NEGATIVE mg/dL   Hgb urine dipstick NEGATIVE NEGATIVE   Bilirubin Urine NEGATIVE NEGATIVE   Ketones, ur NEGATIVE NEGATIVE mg/dL   Protein, ur NEGATIVE NEGATIVE mg/dL   Nitrite NEGATIVE NEGATIVE   Leukocytes,Ua NEGATIVE NEGATIVE   RBC / HPF 0-5 0 - 5 RBC/hpf   WBC, UA 0-5 0 - 5 WBC/hpf   Bacteria, UA NONE SEEN NONE SEEN   Squamous  Epithelial / HPF 0-5 0 - 5 /HPF   Mucus PRESENT  Comment: Performed at [facility], [address] [place], [place] [address]  HIV Antibody (routine testing w rflx)     Status: None   Collection Time: 02/26/23  4:53 AM  Result Value Ref Range   HIV Screen 4th Generation wRfx Non Reactive Non Reactive    Comment: Performed at [facility], [address] [place], [place] [address]  CBC     Status: Abnormal   Collection Time: 02/26/23  4:53 AM  Result Value Ref Range   WBC 7.7 4.0 - 10.5 K/uL   RBC 3.66 (L) 3.87 - 5.11 MIL/uL   Hemoglobin 11.0 (L) 12.0 - 15.0 g/dL   HCT 65.8 (L) 63.9 - 53.9 %   MCV 93.2 80.0 - 100.0 fL   MCH 30.1 26.0 - 34.0 pg   MCHC 32.3 30.0 - 36.0 g/dL   RDW 87.2 88.4 - 84.4 %   Platelets 196 150 - 400 K/uL   nRBC 0.0 0.0 - 0.2 %    Comment: Performed at [facility], [address] [place], [place] [address]  Basic metabolic panel     Status: Abnormal   Collection Time: 02/26/23  4:53 AM  Result Value Ref Range   Sodium 134 (L) 135 - 145 mmol/L   Potassium 3.1 (L) 3.5 - 5.1 mmol/L   Chloride 102 98 - 111 mmol/L   CO2 22 22 - 32 mmol/L   Glucose, Bld 105 (H) 70 - 99 mg/dL    Comment: Glucose reference range applies only to samples taken after fasting for at least 8 hours.   BUN 18 8 - 23 mg/dL   Creatinine, Ser 8.39 (H) 0.44 - 1.00 mg/dL   Calcium  7.8 (L) 8.9 - 10.3 mg/dL   GFR, Estimated 36 (L) >60 mL/min    Comment: (NOTE) Calculated using the CKD-EPI Creatinine Equation (2021)    Anion gap 10 5 - 15    Comment: Performed at [facility], [address] [place], [place] [address]  Magnesium      Status: None   Collection Time: 02/26/23  4:53 AM  Result Value Ref Range   Magnesium  2.3 1.7 - 2.4 mg/dL    Comment: Performed at [facility], [address] [place], [place] [address]  CBG monitoring, ED     Status: Abnormal   Collection Time: 02/26/23  8:04 AM   Result Value Ref Range   Glucose-Capillary 107 (H) 70 - 99 mg/dL    Comment: Glucose reference range applies only to samples taken after fasting for at least 8 hours.  CBG monitoring, ED     Status: Abnormal   Collection Time: 02/26/23 12:39 PM  Result Value Ref Range   Glucose-Capillary 111 (H) 70 - 99 mg/dL    Comment: Glucose reference range applies only to samples taken after fasting for at least 8 hours.   CT ABDOMEN PELVIS WO CONTRAST Result Date: 02/25/2023 CLINICAL DATA:  Abdominal pain, acute, nonlocalized.  Syncope EXAM: CT ABDOMEN AND PELVIS WITHOUT CONTRAST TECHNIQUE: Multidetector CT imaging of the abdomen and pelvis was performed following the standard protocol without IV contrast. RADIATION DOSE REDUCTION: This exam was performed according to the departmental dose-optimization program which includes automated exposure control, adjustment of the mA and/or kV according to patient size and/or use of iterative reconstruction technique. COMPARISON:  01/31/2022 FINDINGS: Lower chest: No acute abnormality. Hepatobiliary: Stable small cyst in the left hepatic lobe. No suspicious hepatic abnormality. Gallbladder unremarkable. No biliary  ductal dilatation. Pancreas: No focal abnormality or ductal dilatation. Spleen: No focal abnormality.  Normal size. Adrenals/Urinary Tract: No adrenal abnormality. No focal renal abnormality. No stones or hydronephrosis. Urinary bladder is unremarkable. Stomach/Bowel: Left colonic diverticulosis. No active diverticulitis. Stomach and small bowel decompressed, unremarkable. Vascular/Lymphatic: No evidence of aneurysm or adenopathy. Reproductive: Prior hysterectomy.  No adnexal masses. Other:  No free fluid or free air. Musculoskeletal: Prior left hip replacement. No acute bony abnormality. IMPRESSION: No acute findings in the abdomen or pelvis. Left colonic diverticulosis. Electronically Signed   By: Franky Crease M.D.   On: 02/25/2023 21:09    Pending  Labs Unresulted Labs (From admission, onward)     Start     Ordered   02/26/23 0329  Hemoglobin A1c  Once,   R       Comments: To assess prior glycemic control    02/26/23 0328            Vitals/Pain Today's Vitals   02/26/23 1000 02/26/23 1023 02/26/23 1340 02/26/23 1429  BP: 136/63 (!) 139/105  115/63  Pulse:  100  (!) 102  Resp:  (!) 24  12  Temp: 97.9 F (36.6 C)  97.8 F (36.6 C) 97.8 F (36.6 C)  TempSrc: Oral  Oral Oral  SpO2:  98%  99%  Weight:      Height:      PainSc:        Isolation Precautions No active isolations  Medications Medications  0.9 %  sodium chloride  infusion ( Intravenous Restarted 02/26/23 1238)  atorvastatin  (LIPITOR) tablet 40 mg (has no administration in time range)  ALPRAZolam  (XANAX ) tablet 0.125-0.25 mg (has no administration in time range)  buPROPion  (WELLBUTRIN  XL) 24 hr tablet 150 mg (150 mg Oral Given 02/26/23 1047)  venlafaxine  XR (EFFEXOR -XR) 24 hr capsule 150 mg (150 mg Oral Given 02/26/23 0856)  carbamazepine  (TEGRETOL ) tablet 200 mg (has no administration in time range)  levETIRAcetam  (KEPPRA ) tablet 500 mg (500 mg Oral Given 02/26/23 1047)  acetaminophen  (TYLENOL ) tablet 650 mg (has no administration in time range)    Or  acetaminophen  (TYLENOL ) suppository 650 mg (has no administration in time range)  insulin  aspart (novoLOG ) injection 0-9 Units ( Subcutaneous Not Given 02/26/23 1250)  insulin  aspart (novoLOG ) injection 0-5 Units (has no administration in time range)  cefTRIAXone  (ROCEPHIN ) 1 g in sodium chloride  0.9 % 100 mL IVPB (0 g Intravenous Stopped 02/26/23 0825)  sodium chloride  0.9 % bolus 1,000 mL (0 mLs Intravenous Stopped 02/26/23 0825)  ondansetron  (ZOFRAN ) injection 4 mg (4 mg Intravenous Given 02/25/23 2127)  meclizine  (ANTIVERT ) tablet 12.5 mg (12.5 mg Oral Given 02/26/23 0325)  potassium chloride  SA (KLOR-CON  M) CR tablet 40 mEq (40 mEq Oral Given 02/26/23 0500)  potassium chloride  (KLOR-CON ) packet 40 mEq (40 mEq Oral  Given 02/26/23 1048)  ondansetron  (ZOFRAN ) injection 4 mg (4 mg Intravenous Given 02/26/23 1313)    Mobility walks with person assist

## 2023-02-26 NOTE — ED Notes (Signed)
 Orthostatics:  Sitting: BP:151/94  HR: 130  Standing: BP:137/74   HR: 135

## 2023-02-26 NOTE — Care Management Obs Status (Signed)
 MEDICARE OBSERVATION STATUS NOTIFICATION   Patient Details  Name: Summer [name] MRN: [id number] Date of Birth: 05-29-1955   Medicare Observation Status Notification Given:  Other (see comment) ([name] declined)  This RNCM explained OBS status and patient reports she has other bills and would prefer to go home. This RNCM notified UR RN, bedside RN, MD.  No additional TOC needs at this time.  Stefan JONELLE Cory, RN 02/26/2023, 6:14 PM

## 2023-02-26 NOTE — ED Notes (Signed)
 Pt went to the bathroom. Obvious blood noted in pts stool. Pt was pale and stated that she felt like she was going to pass out. This clinical research associate called for a wheelchair and EDP. Hospitalist was notified prior to incident and notified again after incident. This writer tried to have a occult performed but was told there was no need because of the obvious blood. Pt was transferred to a wheelchair and wheeled back to room. Pt was changed into a gown and placed on a 12 lead. Pt in back in bed at this time and was informed that she is now a fall risk and needs to call out before getting out of bed.

## 2023-02-26 NOTE — Plan of Care (Signed)

## 2023-02-26 NOTE — Care Plan (Signed)
 This 68 yrs old female with PMH significant for type 2 diabetes, hypertension, hyperlipidemia, GERD, CKD stage IIIb, obesity, pulmonary nodule, depression, anxiety, left eye blindness, BPPV, seizure disorder presented to ED after she had syncopal episode at home and had initially refused transport but then had a near syncopal episode with EMS and brought into the ED. She reported nausea and vomiting for the last few days in the setting of Ozempic use.  Patient was orthostatic in the ED.  Patient has also noticed blood on the toilet paper today but no melena or hematochezia.  CT abdomen pelvis negative for acute findings.  Patient was admitted for near syncope /orthostatic hypotension due to dehydration in the setting of nausea and vomiting.  Patient was seen and examined at bedside.

## 2023-02-26 NOTE — ED Notes (Signed)
 Placed order for Zofran for pts nausea per Dr. Idelle Leech

## 2023-02-27 ENCOUNTER — Telehealth (HOSPITAL_BASED_OUTPATIENT_CLINIC_OR_DEPARTMENT_OTHER): Payer: Self-pay

## 2023-02-27 DIAGNOSIS — R55 Syncope and collapse: Secondary | ICD-10-CM | POA: Diagnosis not present

## 2023-02-27 DIAGNOSIS — K921 Melena: Secondary | ICD-10-CM | POA: Diagnosis not present

## 2023-02-27 DIAGNOSIS — D649 Anemia, unspecified: Secondary | ICD-10-CM | POA: Diagnosis not present

## 2023-02-27 LAB — BASIC METABOLIC PANEL
Anion gap: 10 (ref 5–15)
BUN: 12 mg/dL (ref 8–23)
CO2: 20 mmol/L — ABNORMAL LOW (ref 22–32)
Calcium: 7.6 mg/dL — ABNORMAL LOW (ref 8.9–10.3)
Chloride: 104 mmol/L (ref 98–111)
Creatinine, Ser: 1.12 mg/dL — ABNORMAL HIGH (ref 0.44–1.00)
GFR, Estimated: 55 mL/min — ABNORMAL LOW (ref >60)
Glucose, Bld: 107 mg/dL — ABNORMAL HIGH (ref 70–99)
Potassium: 3.6 mmol/L (ref 3.5–5.1)
Sodium: 134 mmol/L — ABNORMAL LOW (ref 135–145)

## 2023-02-27 LAB — PHOSPHORUS: Phosphorus: 1.9 mg/dL — ABNORMAL LOW (ref 2.5–4.6)

## 2023-02-27 LAB — GLUCOSE, CAPILLARY
Glucose-Capillary: 98 mg/dL (ref 70–99)
Glucose-Capillary: 105 mg/dL — ABNORMAL HIGH (ref 70–99)
Glucose-Capillary: 78 mg/dL (ref 70–99)
Glucose-Capillary: 114 mg/dL — ABNORMAL HIGH (ref 70–99)

## 2023-02-27 LAB — MAGNESIUM: Magnesium: 2.2 mg/dL (ref 1.7–2.4)

## 2023-02-27 LAB — HEMOGLOBIN A1C
Hgb A1c MFr Bld: 6.2 % — ABNORMAL HIGH (ref 4.8–5.6)
Mean Plasma Glucose: 131 mg/dL

## 2023-02-27 LAB — CBC
HCT: 34 % — ABNORMAL LOW (ref 36.0–46.0)
Hemoglobin: 10.4 g/dL — ABNORMAL LOW (ref 12.0–15.0)
MCH: 29.5 pg (ref 26.0–34.0)
MCHC: 30.6 g/dL (ref 30.0–36.0)
MCV: 96.6 fL (ref 80.0–100.0)
Platelets: 156 10*3/uL (ref 150–400)
RBC: 3.52 MIL/uL — ABNORMAL LOW (ref 3.87–5.11)
RDW: 13.1 % (ref 11.5–15.5)
WBC: 5.8 10*3/uL (ref 4.0–10.5)
nRBC: 0 % (ref 0.0–0.2)

## 2023-02-27 MED ORDER — POTASSIUM PHOSPHATES 15 MMOLE/5ML IV SOLN
30.0000 mmol | Freq: Once | INTRAVENOUS | Status: DC
  Filled 2023-02-27: qty 10

## 2023-02-27 MED ORDER — CEPHALEXIN 500 MG PO CAPS
500.0000 mg | ORAL_CAPSULE | Freq: Four times a day (QID) | ORAL | Status: DC
  Administered 2023-02-28 – 2023-03-01 (×6): 500 mg via ORAL
  Filled 2023-02-27 (×6): qty 1

## 2023-02-27 MED ORDER — POTASSIUM PHOSPHATES 15 MMOLE/5ML IV SOLN
30.0000 mmol | Freq: Once | INTRAVENOUS | Status: AC
  Administered 2023-02-27: 30 mmol via INTRAVENOUS
  Filled 2023-02-27: qty 10

## 2023-02-27 NOTE — Telephone Encounter (Signed)
 Post ED Visit - Positive Culture Follow-up  Culture report reviewed by antimicrobial stewardship pharmacist: [facility] [x]  Dorn Poot, Pharm.D. []  Venetia Gully, Pharm.D., BCPS AQ-ID []  Garrel Crews, Pharm.D., BCPS []  Almarie Lunger, Pharm.D., BCPS []  Kemp, [address].D., BCPS, AAHIVP []  Rosaline Bihari, Pharm.D., BCPS, AAHIVP []  Vernell Meier, PharmD, BCPS []  Latanya Hint, PharmD, BCPS []  Donald Medley, PharmD, BCPS []  Rocky Bold, PharmD []  Dorothyann Alert, PharmD, BCPS []  Morene Babe, PharmD  [facility] []  Rosaline Edison, PharmD []  Romona Bliss, PharmD []  Dolphus Roller, PharmD []  Veva Seip, Rph []  Vernell Daunt) Leonce, PharmD []  Eva Allis, PharmD []  Rosaline Millet, PharmD []  Iantha Batch, PharmD []  Arvin Gauss, PharmD []  Wanda Hasting, PharmD []  Ronal Rav, PharmD []  Rocky Slade, PharmD []  Bard Jeans, PharmD   Positive urine culture Treated with Cephalexin , organism sensitive to the same and no further patient follow-up is required at this time.  Ruth Camelia Elbe 02/27/2023, 10:49 AM

## 2023-02-27 NOTE — Progress Notes (Signed)
 PROGRESS NOTE    Summer [name]  FMW:[id number] DOB: 11-Apr-1955 DOA: 02/25/2023 PCP: Teresa Channel, MD   Brief Narrative: This 68 yrs old female with PMH significant for type 2 diabetes, hypertension, hyperlipidemia, GERD, CKD stage IIIb, obesity, pulmonary nodule, depression, anxiety, left eye blindness, BPPV, seizure disorder presented to ED after she had syncopal episode at home and had initially refused transport but then had a near syncopal episode with EMS and brought into the ED. She reported nausea and vomiting for the last few days in the setting of Ozempic use.  Patient was orthostatic in the ED.  Patient has also noticed blood on the toilet paper today but no melena or hematochezia.  CT abdomen pelvis negative for acute findings.  Patient was admitted for near syncope /orthostatic hypotension due to dehydration in the setting of nausea and vomiting   Assessment & Plan:   Principal Problem:   Near syncope Active Problems:   Type 2 diabetes mellitus (HCC)   Orthostatic hypotension   AKI (acute kidney injury) (HCC)   Nausea and vomiting   UTI (urinary tract infection)   Hypokalemia  Near syncope/orthostatic hypotension: Due to severe dehydration in the setting of vomiting and not taking oral. Denies any LOC.  She continued to have several episodes of near syncope.   Blood pressure did drop from 149 systolic lying to 115 systolic standing.   Patient also reports blood in the stool.   CT did show left colonic diverticulosis.  However, patient denies having any bloody stools at home.   Hemoccult positive but no melena or hematochezia noted on rectal exam.  Continue IV fluid hydration and repeat orthostatics in the morning.   Hold home aspirin  and trend H&H.  She is not on anticoagulation.  Fall precautions.   AKI on CKD stage IIIb: Likely prerenal in etiology from severe dehydration given vomiting for several days.   Creatinine 1.9 and baseline appears to be around 1.4.   CT  without evidence of obstructive uropathy.   Continue IV fluid hydration and monitor renal function.   Avoid nephrotoxic agents/hold home hydrochlorothiazide.   Nausea and vomiting / UTI: Nausea and vomiting likely related to Ozempic use but UTI could also possibly be contributing.   Symptoms initially started 6 days ago but now improved.   She was seen in the ED 2 days ago for these symptoms and lipase and LFTs were normal on labs.  CT abdomen pelvis done at this time negative for acute findings. UA done during recent ED visit was showing small amount of leukocytes and microscopy with 21-50 WBCs and rare bacteria.  Urine culture grew >100,000 colonies of gram-negative rods and she was prescribed Keflex .  No fever, leukocytosis, or signs of sepsis at this time.   Continue ceftriaxone .   Hypokalemia: Replaced.  Continue to monitor   Type 2 diabetes: Last A1c 6.0  9/23 Continue sliding scale insulin  ACHS.   Hypertension; Hold antihypertensives at this time and repeat orthostatics in the morning.   Hyperlipidemia: Continue Lipitor.   Anxiety and depression: Continue venlafaxine  150 mg daily   Seizure disorder: Keppra  500 twice daily.    DVT prophylaxis:  SCDS Code Status: Full code Family Communication: No family at bed side Disposition Plan:     Status is: Observation The patient remains OBS appropriate and will d/c before 2 midnights.  Admitted for presyncopal episodes likely in the setting of diverticular bleed which has been stopped.  GI consulted    Consultants:  Gastroenterology  Procedures:None  Antimicrobials:  Anti-infectives (From admission, onward)    Start     Dose/Rate Route Frequency Ordered Stop   02/28/23 0600  cephALEXin  (KEFLEX ) capsule 500 mg        500 mg Oral Every 6 hours 02/27/23 1333     02/26/23 0400  cefTRIAXone  (ROCEPHIN ) 1 g in sodium chloride  0.9 % 100 mL IVPB  Status:  Discontinued        1 g 200 mL/hr over 30 Minutes Intravenous Every  24 hours 02/26/23 0341 02/27/23 1332      Subjective: Patient was seen and examined at bedside.  Overnight events noted.   Patient reports doing much better.  Patient denies any further episodes of blood in the stools. She still feels lightheaded but improving  Objective: Vitals:   02/26/23 2026 02/27/23 0008 02/27/23 0453 02/27/23 1152  BP: (!) 137/55 (!) 143/62 132/61 (!) 152/66  Pulse: (!) 107 (!) 106 98   Resp: 18 18 18 18   Temp: 98.3 F (36.8 C) 98.4 F (36.9 C) 98.2 F (36.8 C) 98.6 F (37 C)  TempSrc: Oral Oral Oral   SpO2: 99% 96% 98% 100%  Weight:      Height:        Intake/Output Summary (Last 24 hours) at 02/27/2023 1424 Last data filed at 02/26/2023 2208 Gross per 24 hour  Intake 1220 ml  Output --  Net 1220 ml   Filed Weights   02/25/23 1912 02/25/23 1922  Weight: 100 kg 100.2 kg    Examination:  General exam: Appears calm and comfortable, deconditioned, not in any acute distress. Respiratory system: Clear to auscultation. Respiratory effort normal.  RR 15 Cardiovascular system: S1 & S2 heard, RRR. No JVD, murmurs, rubs, gallops or clicks.  Gastrointestinal system: Abdomen is non distended, soft and non tender. Normal bowel sounds heard. Central nervous system: Alert and oriented x 3. No focal neurological deficits. Extremities: No edema, no cyanosis, no clubbing Skin: No rashes, lesions or ulcers Psychiatry: Judgement and insight appear normal. Mood & affect appropriate.     Data Reviewed: I have personally reviewed following labs and imaging studies  CBC: Recent Labs  Lab 02/23/23 1724 02/25/23 1936 02/26/23 0453 02/27/23 0525  WBC 6.9 9.4 7.7 5.8  NEUTROABS 4.7  --   --   --   HGB 12.3 12.3 11.0* 10.4*  HCT 37.8 37.9 34.1* 34.0*  MCV 91.7 94.0 93.2 96.6  PLT 221 204 196 156   Basic Metabolic Panel: Recent Labs  Lab 02/23/23 1724 02/25/23 1936 02/26/23 0453 02/27/23 0525  NA 136 135 134* 134*  K 3.5 3.3* 3.1* 3.6  CL 98 99 102 104   CO2 30 24 22  20*  GLUCOSE 124* 125* 105* 107*  BUN 17 20 18 12   CREATININE 1.66* 1.91* 1.60* 1.12*  CALCIUM  8.9 8.7* 7.8* 7.6*  MG  --   --  2.3 2.2  PHOS  --   --   --  1.9*   GFR: Estimated Creatinine Clearance: 58.7 mL/min (A) (by C-G formula based on SCr of 1.12 mg/dL (H)). Liver Function Tests: Recent Labs  Lab 02/23/23 1724  AST 21  ALT 12  ALKPHOS 115  BILITOT 0.4  PROT 8.7*  ALBUMIN 4.1   Recent Labs  Lab 02/24/23 0015  LIPASE 25   No results for input(s): AMMONIA in the last 168 hours. Coagulation Profile: No results for input(s): INR, PROTIME in the last 168 hours. Cardiac Enzymes: No results for input(s): CKTOTAL, CKMB,  CKMBINDEX, TROPONINI in the last 168 hours. BNP (last 3 results) No results for input(s): PROBNP in the last 8760 hours. HbA1C: Recent Labs    02/26/23 0453  HGBA1C 6.2*   CBG: Recent Labs  Lab 02/26/23 1239 02/26/23 1723 02/26/23 2033 02/27/23 0723 02/27/23 1132  GLUCAP 111* 111* 126* 98 105*   Lipid Profile: No results for input(s): CHOL, HDL, LDLCALC, TRIG, CHOLHDL, LDLDIRECT in the last 72 hours. Thyroid  Function Tests: No results for input(s): TSH, T4TOTAL, FREET4, T3FREE, THYROIDAB in the last 72 hours. Anemia Panel: No results for input(s): VITAMINB12, FOLATE, FERRITIN, TIBC, IRON, RETICCTPCT in the last 72 hours. Sepsis Labs: No results for input(s): PROCALCITON, LATICACIDVEN in the last 168 hours.  Recent Results (from the past 240 hours)  Resp panel by RT-PCR (RSV, Flu A&B, Covid) Anterior Nasal Swab     Status: None   Collection Time: 02/23/23  5:23 PM   Specimen: Anterior Nasal Swab  Result Value Ref Range Status   SARS Coronavirus 2 by RT PCR NEGATIVE NEGATIVE Final    Comment: (NOTE) SARS-CoV-2 target nucleic acids are NOT DETECTED.  The SARS-CoV-2 RNA is generally detectable in upper respiratory specimens during the acute phase of infection. The  lowest concentration of SARS-CoV-2 viral copies this assay can detect is 138 copies/mL. A negative result does not preclude SARS-Cov-2 infection and should not be used as the sole basis for treatment or other patient management decisions. A negative result may occur with  improper specimen collection/handling, submission of specimen other than nasopharyngeal swab, presence of viral mutation(s) within the areas targeted by this assay, and inadequate number of viral copies(<138 copies/mL). A negative result must be combined with clinical observations, patient history, and epidemiological information. The expected result is Negative.  Fact Sheet for Patients:  bloggercourse.com  Fact Sheet for Healthcare Providers:  seriousbroker.it  This test is no t yet approved or cleared by the [place]  FDA and  has been authorized for detection and/or diagnosis of SARS-CoV-2 by FDA under an Emergency Use Authorization (EUA). This EUA will remain  in effect (meaning this test can be used) for the duration of the COVID-19 declaration under Section 564(b)(1) of the Act, 21 U.S.C.section 360bbb-3(b)(1), unless the authorization is terminated  or revoked sooner.       Influenza A by PCR NEGATIVE NEGATIVE Final   Influenza B by PCR NEGATIVE NEGATIVE Final    Comment: (NOTE) The Xpert Xpress SARS-CoV-2/FLU/RSV plus assay is intended as an aid in the diagnosis of influenza from Nasopharyngeal swab specimens and should not be used as a sole basis for treatment. Nasal washings and aspirates are unacceptable for Xpert Xpress SARS-CoV-2/FLU/RSV testing.  Fact Sheet for Patients: bloggercourse.com  Fact Sheet for Healthcare Providers: seriousbroker.it  This test is not yet approved or cleared by the [place]  FDA and has been authorized for detection and/or diagnosis of SARS-CoV-2 by FDA under  an Emergency Use Authorization (EUA). This EUA will remain in effect (meaning this test can be used) for the duration of the COVID-19 declaration under Section 564(b)(1) of the Act, 21 U.S.C. section 360bbb-3(b)(1), unless the authorization is terminated or revoked.     Resp Syncytial Virus by PCR NEGATIVE NEGATIVE Final    Comment: (NOTE) Fact Sheet for Patients: bloggercourse.com  Fact Sheet for Healthcare Providers: seriousbroker.it  This test is not yet approved or cleared by the [place]  FDA and has been authorized for detection and/or diagnosis of SARS-CoV-2 by FDA under an Emergency Use Authorization (EUA).  This EUA will remain in effect (meaning this test can be used) for the duration of the COVID-19 declaration under Section 564(b)(1) of the Act, 21 U.S.C. section 360bbb-3(b)(1), unless the authorization is terminated or revoked.  Performed at [facility], [address], [place], [place] [address]   Urine Culture     Status: Abnormal   Collection Time: 02/23/23 11:22 PM   Specimen: Urine, Random  Result Value Ref Range Status   Specimen Description   Final    URINE, RANDOM Performed at [facility], [address], [place], [place] [address]    Special Requests   Final    NONE Reflexed from [id number] Performed at [facility], [address], [place], [place] [address]    Culture >=100,000 COLONIES/mL ESCHERICHIA COLI (A)  Final   Report Status 02/26/2023 FINAL  Final   Organism ID, Bacteria ESCHERICHIA COLI (A)  Final      Susceptibility   Escherichia coli - MIC*    AMPICILLIN 4 SENSITIVE Sensitive     CEFAZOLIN  <=4 SENSITIVE Sensitive     CEFEPIME <=0.12 SENSITIVE Sensitive     CEFTRIAXONE  <=0.25 SENSITIVE Sensitive     CIPROFLOXACIN >=4 RESISTANT Resistant     GENTAMICIN <=1 SENSITIVE Sensitive     IMIPENEM <=0.25 SENSITIVE Sensitive      NITROFURANTOIN <=16 SENSITIVE Sensitive     TRIMETH/SULFA <=20 SENSITIVE Sensitive     AMPICILLIN/SULBACTAM <=2 SENSITIVE Sensitive     PIP/TAZO <=4 SENSITIVE Sensitive ug/mL    * >=100,000 COLONIES/mL ESCHERICHIA COLI  Resp panel by RT-PCR (RSV, Flu A&B, Covid) Anterior Nasal Swab     Status: None   Collection Time: 02/25/23  9:09 PM   Specimen: Anterior Nasal Swab  Result Value Ref Range Status   SARS Coronavirus 2 by RT PCR NEGATIVE NEGATIVE Final    Comment: (NOTE) SARS-CoV-2 target nucleic acids are NOT DETECTED.  The SARS-CoV-2 RNA is generally detectable in upper respiratory specimens during the acute phase of infection. The lowest concentration of SARS-CoV-2 viral copies this assay can detect is 138 copies/mL. A negative result does not preclude SARS-Cov-2 infection and should not be used as the sole basis for treatment or other patient management decisions. A negative result may occur with  improper specimen collection/handling, submission of specimen other than nasopharyngeal swab, presence of viral mutation(s) within the areas targeted by this assay, and inadequate number of viral copies(<138 copies/mL). A negative result must be combined with clinical observations, patient history, and epidemiological information. The expected result is Negative.  Fact Sheet for Patients:  bloggercourse.com  Fact Sheet for Healthcare Providers:  seriousbroker.it  This test is no t yet approved or cleared by the [place]  FDA and  has been authorized for detection and/or diagnosis of SARS-CoV-2 by FDA under an Emergency Use Authorization (EUA). This EUA will remain  in effect (meaning this test can be used) for the duration of the COVID-19 declaration under Section 564(b)(1) of the Act, 21 U.S.C.section 360bbb-3(b)(1), unless the authorization is terminated  or revoked sooner.       Influenza A by PCR NEGATIVE NEGATIVE Final    Influenza B by PCR NEGATIVE NEGATIVE Final    Comment: (NOTE) The Xpert Xpress SARS-CoV-2/FLU/RSV plus assay is intended as an aid in the diagnosis of influenza from Nasopharyngeal swab specimens and should not be used as a sole basis for treatment. Nasal washings and aspirates are unacceptable for Xpert Xpress SARS-CoV-2/FLU/RSV testing.  Fact Sheet for Patients: bloggercourse.com  Fact Sheet for Healthcare Providers: seriousbroker.it  This test is not yet approved or cleared by the [place]  FDA and has been authorized for detection and/or diagnosis of SARS-CoV-2 by FDA under an Emergency Use Authorization (EUA). This EUA will remain in effect (meaning this test can be used) for the duration of the COVID-19 declaration under Section 564(b)(1) of the Act, 21 U.S.C. section 360bbb-3(b)(1), unless the authorization is terminated or revoked.     Resp Syncytial Virus by PCR NEGATIVE NEGATIVE Final    Comment: (NOTE) Fact Sheet for Patients: bloggercourse.com  Fact Sheet for Healthcare Providers: seriousbroker.it  This test is not yet approved or cleared by the [place]  FDA and has been authorized for detection and/or diagnosis of SARS-CoV-2 by FDA under an Emergency Use Authorization (EUA). This EUA will remain in effect (meaning this test can be used) for the duration of the COVID-19 declaration under Section 564(b)(1) of the Act, 21 U.S.C. section 360bbb-3(b)(1), unless the authorization is terminated or revoked.  Performed at [facility], [address] [place], [place] [address]          Radiology Studies: CT ABDOMEN PELVIS WO CONTRAST Result Date: 02/25/2023 CLINICAL DATA:  Abdominal pain, acute, nonlocalized.  Syncope EXAM: CT ABDOMEN AND PELVIS WITHOUT CONTRAST TECHNIQUE: Multidetector CT imaging of the abdomen and pelvis was performed  following the standard protocol without IV contrast. RADIATION DOSE REDUCTION: This exam was performed according to the departmental dose-optimization program which includes automated exposure control, adjustment of the mA and/or kV according to patient size and/or use of iterative reconstruction technique. COMPARISON:  01/31/2022 FINDINGS: Lower chest: No acute abnormality. Hepatobiliary: Stable small cyst in the left hepatic lobe. No suspicious hepatic abnormality. Gallbladder unremarkable. No biliary ductal dilatation. Pancreas: No focal abnormality or ductal dilatation. Spleen: No focal abnormality.  Normal size. Adrenals/Urinary Tract: No adrenal abnormality. No focal renal abnormality. No stones or hydronephrosis. Urinary bladder is unremarkable. Stomach/Bowel: Left colonic diverticulosis. No active diverticulitis. Stomach and small bowel decompressed, unremarkable. Vascular/Lymphatic: No evidence of aneurysm or adenopathy. Reproductive: Prior hysterectomy.  No adnexal masses. Other:  No free fluid or free air. Musculoskeletal: Prior left hip replacement. No acute bony abnormality. IMPRESSION: No acute findings in the abdomen or pelvis. Left colonic diverticulosis. Electronically Signed   By: Franky Crease M.D.   On: 02/25/2023 21:09   Scheduled Meds:  atorvastatin   40 mg Oral QPM   buPROPion   150 mg Oral q morning   carbamazepine   200 mg Oral QHS   [START ON 02/28/2023] cephALEXin   500 mg Oral Q6H   insulin  aspart  0-5 Units Subcutaneous QHS   insulin  aspart  0-9 Units Subcutaneous TID WC   levETIRAcetam   500 mg Oral BID   venlafaxine  XR  150 mg Oral Q breakfast   Continuous Infusions:  potassium PHOSPHATE  30 mmol in dextrose  5 % 250 mL infusion 30 mmol (02/27/23 0951)     LOS: 0 days    Time spent: 50 mins    Darcel Dawley, MD [facility]   If 7PM-7AM, please contact night-coverage

## 2023-02-27 NOTE — Progress Notes (Signed)
 Mobility Specialist - Progress Note   02/27/23 1406  Mobility  Activity Ambulated with assistance in hallway  Level of Assistance Standby assist, set-up cues, supervision of patient - no hands on (chair follow too)  Assistive Device Front wheel walker  Distance Ambulated (ft) 160 ft  Range of Motion/Exercises Active  Activity Response Tolerated well  Mobility Referral Yes  Mobility visit 1 Mobility  Mobility Specialist Start Time (ACUTE ONLY) 1340  Mobility Specialist Stop Time (ACUTE ONLY) 1350  Mobility Specialist Time Calculation (min) (ACUTE ONLY) 10 min   Received in bed requesting to ambulate. No issues throughout session. Had chair follow just in case of syncopal episode. Returned to chair with all needs met.  Cyndee Ada Mobility Specialist

## 2023-02-27 NOTE — Progress Notes (Signed)
   02/27/23 1605  TOC Brief Assessment  Insurance and Status Reviewed  Patient has primary care physician Yes  Home environment has been reviewed Apartment  Prior level of function: modified independent  Prior/Current Home Services No current home services  Social Drivers of Health Review SDOH reviewed no interventions necessary  Readmission risk has been reviewed Yes  Transition of care needs no transition of care needs at this time
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
## 2023-02-28 DIAGNOSIS — K921 Melena: Secondary | ICD-10-CM | POA: Diagnosis not present

## 2023-02-28 DIAGNOSIS — Z7982 Long term (current) use of aspirin: Secondary | ICD-10-CM | POA: Diagnosis not present

## 2023-02-28 DIAGNOSIS — Z1152 Encounter for screening for COVID-19: Secondary | ICD-10-CM | POA: Diagnosis not present

## 2023-02-28 DIAGNOSIS — Z7984 Long term (current) use of oral hypoglycemic drugs: Secondary | ICD-10-CM | POA: Diagnosis not present

## 2023-02-28 DIAGNOSIS — R000 Tachycardia, unspecified: Secondary | ICD-10-CM

## 2023-02-28 DIAGNOSIS — Z79899 Other long term (current) drug therapy: Secondary | ICD-10-CM | POA: Diagnosis not present

## 2023-02-28 DIAGNOSIS — N390 Urinary tract infection, site not specified: Secondary | ICD-10-CM | POA: Diagnosis not present

## 2023-02-28 DIAGNOSIS — N1832 Chronic kidney disease, stage 3b: Secondary | ICD-10-CM | POA: Diagnosis not present

## 2023-02-28 DIAGNOSIS — E876 Hypokalemia: Secondary | ICD-10-CM | POA: Diagnosis not present

## 2023-02-28 DIAGNOSIS — Z96642 Presence of left artificial hip joint: Secondary | ICD-10-CM | POA: Diagnosis not present

## 2023-02-28 DIAGNOSIS — N179 Acute kidney failure, unspecified: Secondary | ICD-10-CM | POA: Diagnosis not present

## 2023-02-28 DIAGNOSIS — I951 Orthostatic hypotension: Secondary | ICD-10-CM | POA: Diagnosis not present

## 2023-02-28 DIAGNOSIS — I129 Hypertensive chronic kidney disease with stage 1 through stage 4 chronic kidney disease, or unspecified chronic kidney disease: Secondary | ICD-10-CM | POA: Diagnosis not present

## 2023-02-28 DIAGNOSIS — R55 Syncope and collapse: Secondary | ICD-10-CM | POA: Diagnosis not present

## 2023-02-28 DIAGNOSIS — D649 Anemia, unspecified: Secondary | ICD-10-CM | POA: Diagnosis not present

## 2023-02-28 LAB — CBC
HCT: 35.6 % — ABNORMAL LOW (ref 36.0–46.0)
Hemoglobin: 11.3 g/dL — ABNORMAL LOW (ref 12.0–15.0)
MCH: 30.1 pg (ref 26.0–34.0)
MCHC: 31.7 g/dL (ref 30.0–36.0)
MCV: 94.7 fL (ref 80.0–100.0)
Platelets: 173 10*3/uL (ref 150–400)
RBC: 3.76 MIL/uL — ABNORMAL LOW (ref 3.87–5.11)
RDW: 13.1 % (ref 11.5–15.5)
WBC: 5.8 10*3/uL (ref 4.0–10.5)
nRBC: 0 % (ref 0.0–0.2)

## 2023-02-28 LAB — BASIC METABOLIC PANEL
Anion gap: 11 (ref 5–15)
BUN: 11 mg/dL (ref 8–23)
CO2: 22 mmol/L (ref 22–32)
Calcium: 8.3 mg/dL — ABNORMAL LOW (ref 8.9–10.3)
Chloride: 103 mmol/L (ref 98–111)
Creatinine, Ser: 1.34 mg/dL — ABNORMAL HIGH (ref 0.44–1.00)
GFR, Estimated: 44 mL/min — ABNORMAL LOW (ref >60)
Glucose, Bld: 111 mg/dL — ABNORMAL HIGH (ref 70–99)
Potassium: 3.6 mmol/L (ref 3.5–5.1)
Sodium: 136 mmol/L (ref 135–145)

## 2023-02-28 LAB — GLUCOSE, CAPILLARY
Glucose-Capillary: 112 mg/dL — ABNORMAL HIGH (ref 70–99)
Glucose-Capillary: 105 mg/dL — ABNORMAL HIGH (ref 70–99)
Glucose-Capillary: 108 mg/dL — ABNORMAL HIGH (ref 70–99)
Glucose-Capillary: 116 mg/dL — ABNORMAL HIGH (ref 70–99)

## 2023-02-28 LAB — PHOSPHORUS: Phosphorus: 2.7 mg/dL (ref 2.5–4.6)

## 2023-02-28 LAB — MAGNESIUM: Magnesium: 1.9 mg/dL (ref 1.7–2.4)

## 2023-02-28 MED ORDER — ONDANSETRON HCL 4 MG/2ML IJ SOLN
4.0000 mg | Freq: Four times a day (QID) | INTRAMUSCULAR | Status: DC | PRN
  Administered 2023-02-28: 4 mg via INTRAVENOUS
  Filled 2023-02-28: qty 2

## 2023-02-28 MED ORDER — METOPROLOL TARTRATE 25 MG PO TABS
25.0000 mg | ORAL_TABLET | Freq: Two times a day (BID) | ORAL | Status: DC
  Administered 2023-02-28 – 2023-03-01 (×2): 25 mg via ORAL
  Filled 2023-02-28 (×2): qty 1

## 2023-02-28 MED ORDER — SENNOSIDES-DOCUSATE SODIUM 8.6-50 MG PO TABS
1.0000 | ORAL_TABLET | Freq: Two times a day (BID) | ORAL | Status: DC
Start: 2023-02-28 — End: 2023-03-01
  Administered 2023-02-28 – 2023-03-01 (×3): 1 via ORAL
  Filled 2023-02-28 (×3): qty 1

## 2023-02-28 MED ORDER — CALCIUM CARBONATE ANTACID 500 MG PO CHEW
1.0000 | CHEWABLE_TABLET | Freq: Two times a day (BID) | ORAL | Status: DC | PRN
  Administered 2023-02-28: 200 mg via ORAL
  Filled 2023-02-28: qty 1
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
## 2023-02-28 NOTE — Plan of Care (Signed)
   Problem: Education: Goal: Ability to describe self-care measures that may prevent or decrease complications (Diabetes Survival Skills Education) will improve Outcome: Progressing   Problem: Coping: Goal: Ability to adjust to condition or change in health will improve Outcome: Progressing   Problem: Fluid Volume: Goal: Ability to maintain a balanced intake and output will improve Outcome: Progressing

## 2023-02-28 NOTE — Plan of Care (Signed)

## 2023-02-28 NOTE — Progress Notes (Signed)
 Mobility Specialist - Progress Note   02/28/23 1456  Mobility  Activity Transferred from bed to chair  Level of Assistance Standby assist, set-up cues, supervision of patient - no hands on  Assistive Device None  Range of Motion/Exercises Active  Activity Response Tolerated well  Mobility Referral Yes  Mobility visit 1 Mobility  Mobility Specialist Start Time (ACUTE ONLY) 1440  Mobility Specialist Stop Time (ACUTE ONLY) 1449  Mobility Specialist Time Calculation (min) (ACUTE ONLY) 9 min   Received in bed and agreed to mobility.  BP readings: Sitting: 142/87 Standing: 135/69 Standing after 3 mins: 143/107 Returned to chair with all needs met.  Cyndee Ada Mobility Specialist

## 2023-02-28 NOTE — Progress Notes (Addendum)
 PROGRESS NOTE    KIERRAH KILBRIDE  FMW:[id number] DOB: Oct 09, 1955 DOA: 02/25/2023 PCP: Teresa Channel, MD   Brief Narrative: This 68 yrs old female with PMH significant for type 2 diabetes, hypertension, hyperlipidemia, GERD, CKD stage IIIb, obesity, pulmonary nodule, depression, anxiety, left eye blindness, BPPV, seizure disorder presented to ED after she had syncopal episode at home and had initially refused transport but then had a near syncopal episode with EMS and brought into the ED. She reported nausea and vomiting for the last few days in the setting of Ozempic use.  Patient was orthostatic in the ED.  Patient has also noticed blood on the toilet paper today but no melena or hematochezia.  CT abdomen pelvis negative for acute findings.  Patient was admitted for near syncope /orthostatic hypotension due to dehydration in the setting of nausea and vomiting   Assessment & Plan:   Principal Problem:   Near syncope Active Problems:   Type 2 diabetes mellitus (HCC)   Orthostatic hypotension   AKI (acute kidney injury) (HCC)   Nausea and vomiting   UTI (urinary tract infection)   Hypokalemia  Near syncope/Orthostatic hypotension: Due to severe dehydration in the setting of vomiting and not taking oral. Denies any LOC.  She continued to have several episodes of near syncope.   Blood pressure did drop from 149 systolic lying to 115 systolic standing.   Patient also reports blood in the stool.  CT did show left colonic diverticulosis.  Hemoccult positive but no melena or hematochezia noted on rectal exam.  Continue IV fluid hydration and repeat orthostatics in the morning.   She is not on anticoagulation.  Fall precautions. GI consulted, likely diverticular bleed, no plan for any invasive intervention. GI signed off.   AKI on CKD stage IIIb: Likely prerenal in etiology from severe dehydration given vomiting for several days.   Creatinine 1.9 and baseline appears to be around 1.4.   CT  without evidence of obstructive uropathy.   Serum creatinine back to baseline.   Nausea and vomiting / UTI: Nausea and vomiting likely related to Ozempic use but UTI could also possibly be contributing.   She was seen in the ED 2 days ago for these symptoms and lipase and LFTs were normal on labs.   CT abdomen pelvis done at this time negative for acute findings.  UA done during recent ED visit was showing small amount of leukocytes and microscopy with 21-50 WBCs and rare bacteria.  Urine culture grew >100,000 colonies of gram-negative rods and she was prescribed Keflex .   No fever, leukocytosis, or signs of sepsis at this time.   Continue ceftriaxone .   Hypokalemia: Replaced.  Continue to monitor   Type 2 diabetes: Last A1c 6.0  9/23 Continue sliding scale insulin  ACHS.   Hypertension; Hold antihypertensives at this time and repeat orthostatics in the morning.   Hyperlipidemia: Continue Lipitor.   Anxiety and depression: Continue venlafaxine  150 mg daily   Seizure disorder: Keppra  500 twice daily.    DVT prophylaxis:  SCDS Code Status: Full code Family Communication: No family at bed side Disposition Plan:   Status is: Observation The patient remains OBS appropriate and will d/c before 2 midnights.    Admitted for presyncopal episodes likely in the setting of diverticular bleed which has been stopped.  GI consulted, no plan for any GI evaluation.    Consultants:  Gastroenterology  Procedures:None  Antimicrobials:  Anti-infectives (From admission, onward)    Start  Dose/Rate Route Frequency Ordered Stop   02/28/23 0600  cephALEXin  (KEFLEX ) capsule 500 mg        500 mg Oral Every 6 hours 02/27/23 1333     02/26/23 0400  cefTRIAXone  (ROCEPHIN ) 1 g in sodium chloride  0.9 % 100 mL IVPB  Status:  Discontinued        1 g 200 mL/hr over 30 Minutes Intravenous Every 24 hours 02/26/23 0341 02/27/23 1332      Subjective: Patient was seen and examined at bedside.   Overnight events noted.   Patient reports having an episode of dizziness and felt like her heart was racing while going to the bathroom.  Patient denies any further episodes of blood in the stools.  Objective: Vitals:   02/27/23 2040 02/28/23 0656 02/28/23 0724 02/28/23 1148  BP: (!) 121/51 124/62 127/66 (!) 151/61  Pulse: (!) 102 (!) 132 (!) 131 (!) 116  Resp: 18 18    Temp: 98.5 F (36.9 C) 98.8 F (37.1 C) 98.2 F (36.8 C) 98.7 F (37.1 C)  TempSrc: Oral     SpO2: 98% 95% 95% 97%  Weight:      Height:        Intake/Output Summary (Last 24 hours) at 02/28/2023 1309 Last data filed at 02/27/2023 1603 Gross per 24 hour  Intake 229.33 ml  Output --  Net 229.33 ml   Filed Weights   02/25/23 1912 02/25/23 1922  Weight: 100 kg 100.2 kg    Examination:  General exam: Appears calm and comfortable, deconditioned, not in any acute distress. Respiratory system: CTA bilaterally. Respiratory effort normal.  RR 15 Cardiovascular system: S1 & S2 heard, RRR. No JVD, murmurs, rubs, gallops or clicks.  Gastrointestinal system: Abdomen is non distended, soft and non tender. Normal bowel sounds heard. Central nervous system: Alert and oriented x 3. No focal neurological deficits. Extremities: No edema, no cyanosis, no clubbing Skin: No rashes, lesions or ulcers Psychiatry: Judgement and insight appear normal. Mood & affect appropriate.     Data Reviewed: I have personally reviewed following labs and imaging studies  CBC: Recent Labs  Lab 02/23/23 1724 02/25/23 1936 02/26/23 0453 02/27/23 0525 02/28/23 0539  WBC 6.9 9.4 7.7 5.8 5.8  NEUTROABS 4.7  --   --   --   --   HGB 12.3 12.3 11.0* 10.4* 11.3*  HCT 37.8 37.9 34.1* 34.0* 35.6*  MCV 91.7 94.0 93.2 96.6 94.7  PLT 221 204 196 156 173   Basic Metabolic Panel: Recent Labs  Lab 02/23/23 1724 02/25/23 1936 02/26/23 0453 02/27/23 0525 02/28/23 0539  NA 136 135 134* 134* 136  K 3.5 3.3* 3.1* 3.6 3.6  CL 98 99 102 104 103   CO2 30 24 22  20* 22  GLUCOSE 124* 125* 105* 107* 111*  BUN 17 20 18 12 11   CREATININE 1.66* 1.91* 1.60* 1.12* 1.34*  CALCIUM  8.9 8.7* 7.8* 7.6* 8.3*  MG  --   --  2.3 2.2 1.9  PHOS  --   --   --  1.9* 2.7   GFR: Estimated Creatinine Clearance: 49.1 mL/min (A) (by C-G formula based on SCr of 1.34 mg/dL (H)). Liver Function Tests: Recent Labs  Lab 02/23/23 1724  AST 21  ALT 12  ALKPHOS 115  BILITOT 0.4  PROT 8.7*  ALBUMIN 4.1   Recent Labs  Lab 02/24/23 0015  LIPASE 25   No results for input(s): AMMONIA in the last 168 hours. Coagulation Profile: No results for input(s): INR,  PROTIME in the last 168 hours. Cardiac Enzymes: No results for input(s): CKTOTAL, CKMB, CKMBINDEX, TROPONINI in the last 168 hours. BNP (last 3 results) No results for input(s): PROBNP in the last 8760 hours. HbA1C: Recent Labs    02/26/23 0453  HGBA1C 6.2*   CBG: Recent Labs  Lab 02/27/23 1132 02/27/23 1720 02/27/23 2042 02/28/23 0721 02/28/23 1146  GLUCAP 105* 78 114* 112* 105*   Lipid Profile: No results for input(s): CHOL, HDL, LDLCALC, TRIG, CHOLHDL, LDLDIRECT in the last 72 hours. Thyroid  Function Tests: No results for input(s): TSH, T4TOTAL, FREET4, T3FREE, THYROIDAB in the last 72 hours. Anemia Panel: No results for input(s): VITAMINB12, FOLATE, FERRITIN, TIBC, IRON, RETICCTPCT in the last 72 hours. Sepsis Labs: No results for input(s): PROCALCITON, LATICACIDVEN in the last 168 hours.  Recent Results (from the past 240 hours)  Resp panel by RT-PCR (RSV, Flu A&B, Covid) Anterior Nasal Swab     Status: None   Collection Time: 02/23/23  5:23 PM   Specimen: Anterior Nasal Swab  Result Value Ref Range Status   SARS Coronavirus 2 by RT PCR NEGATIVE NEGATIVE Final    Comment: (NOTE) SARS-CoV-2 target nucleic acids are NOT DETECTED.  The SARS-CoV-2 RNA is generally detectable in upper respiratory specimens during the acute  phase of infection. The lowest concentration of SARS-CoV-2 viral copies this assay can detect is 138 copies/mL. A negative result does not preclude SARS-Cov-2 infection and should not be used as the sole basis for treatment or other patient management decisions. A negative result may occur with  improper specimen collection/handling, submission of specimen other than nasopharyngeal swab, presence of viral mutation(s) within the areas targeted by this assay, and inadequate number of viral copies(<138 copies/mL). A negative result must be combined with clinical observations, patient history, and epidemiological information. The expected result is Negative.  Fact Sheet for Patients:  bloggercourse.com  Fact Sheet for Healthcare Providers:  seriousbroker.it  This test is no t yet approved or cleared by the [place]  FDA and  has been authorized for detection and/or diagnosis of SARS-CoV-2 by FDA under an Emergency Use Authorization (EUA). This EUA will remain  in effect (meaning this test can be used) for the duration of the COVID-19 declaration under Section 564(b)(1) of the Act, 21 U.S.C.section 360bbb-3(b)(1), unless the authorization is terminated  or revoked sooner.       Influenza A by PCR NEGATIVE NEGATIVE Final   Influenza B by PCR NEGATIVE NEGATIVE Final    Comment: (NOTE) The Xpert Xpress SARS-CoV-2/FLU/RSV plus assay is intended as an aid in the diagnosis of influenza from Nasopharyngeal swab specimens and should not be used as a sole basis for treatment. Nasal washings and aspirates are unacceptable for Xpert Xpress SARS-CoV-2/FLU/RSV testing.  Fact Sheet for Patients: bloggercourse.com  Fact Sheet for Healthcare Providers: seriousbroker.it  This test is not yet approved or cleared by the [place]  FDA and has been authorized for detection and/or diagnosis of  SARS-CoV-2 by FDA under an Emergency Use Authorization (EUA). This EUA will remain in effect (meaning this test can be used) for the duration of the COVID-19 declaration under Section 564(b)(1) of the Act, 21 U.S.C. section 360bbb-3(b)(1), unless the authorization is terminated or revoked.     Resp Syncytial Virus by PCR NEGATIVE NEGATIVE Final    Comment: (NOTE) Fact Sheet for Patients: bloggercourse.com  Fact Sheet for Healthcare Providers: seriousbroker.it  This test is not yet approved or cleared by the [place]  FDA and has been authorized  for detection and/or diagnosis of SARS-CoV-2 by FDA under an Emergency Use Authorization (EUA). This EUA will remain in effect (meaning this test can be used) for the duration of the COVID-19 declaration under Section 564(b)(1) of the Act, 21 U.S.C. section 360bbb-3(b)(1), unless the authorization is terminated or revoked.  Performed at [facility], [address], [place], [place] [address]   Urine Culture     Status: Abnormal   Collection Time: 02/23/23 11:22 PM   Specimen: Urine, Random  Result Value Ref Range Status   Specimen Description   Final    URINE, RANDOM Performed at [facility], [address], [place], [place] [address]    Special Requests   Final    NONE Reflexed from [id number] Performed at [facility], [address], [place], [place] [address]    Culture >=100,000 COLONIES/mL ESCHERICHIA COLI (A)  Final   Report Status 02/26/2023 FINAL  Final   Organism ID, Bacteria ESCHERICHIA COLI (A)  Final      Susceptibility   Escherichia coli - MIC*    AMPICILLIN 4 SENSITIVE Sensitive     CEFAZOLIN  <=4 SENSITIVE Sensitive     CEFEPIME <=0.12 SENSITIVE Sensitive     CEFTRIAXONE  <=0.25 SENSITIVE Sensitive     CIPROFLOXACIN >=4 RESISTANT Resistant     GENTAMICIN <=1 SENSITIVE Sensitive     IMIPENEM <=0.25  SENSITIVE Sensitive     NITROFURANTOIN <=16 SENSITIVE Sensitive     TRIMETH/SULFA <=20 SENSITIVE Sensitive     AMPICILLIN/SULBACTAM <=2 SENSITIVE Sensitive     PIP/TAZO <=4 SENSITIVE Sensitive ug/mL    * >=100,000 COLONIES/mL ESCHERICHIA COLI  Resp panel by RT-PCR (RSV, Flu A&B, Covid) Anterior Nasal Swab     Status: None   Collection Time: 02/25/23  9:09 PM   Specimen: Anterior Nasal Swab  Result Value Ref Range Status   SARS Coronavirus 2 by RT PCR NEGATIVE NEGATIVE Final    Comment: (NOTE) SARS-CoV-2 target nucleic acids are NOT DETECTED.  The SARS-CoV-2 RNA is generally detectable in upper respiratory specimens during the acute phase of infection. The lowest concentration of SARS-CoV-2 viral copies this assay can detect is 138 copies/mL. A negative result does not preclude SARS-Cov-2 infection and should not be used as the sole basis for treatment or other patient management decisions. A negative result may occur with  improper specimen collection/handling, submission of specimen other than nasopharyngeal swab, presence of viral mutation(s) within the areas targeted by this assay, and inadequate number of viral copies(<138 copies/mL). A negative result must be combined with clinical observations, patient history, and epidemiological information. The expected result is Negative.  Fact Sheet for Patients:  bloggercourse.com  Fact Sheet for Healthcare Providers:  seriousbroker.it  This test is no t yet approved or cleared by the [place]  FDA and  has been authorized for detection and/or diagnosis of SARS-CoV-2 by FDA under an Emergency Use Authorization (EUA). This EUA will remain  in effect (meaning this test can be used) for the duration of the COVID-19 declaration under Section 564(b)(1) of the Act, 21 U.S.C.section 360bbb-3(b)(1), unless the authorization is terminated  or revoked sooner.       Influenza A by PCR  NEGATIVE NEGATIVE Final   Influenza B by PCR NEGATIVE NEGATIVE Final    Comment: (NOTE) The Xpert Xpress SARS-CoV-2/FLU/RSV plus assay is intended as an aid in the diagnosis of influenza from Nasopharyngeal swab specimens and should not be used as a sole basis for treatment. Nasal washings and aspirates  are unacceptable for Xpert Xpress SARS-CoV-2/FLU/RSV testing.  Fact Sheet for Patients: bloggercourse.com  Fact Sheet for Healthcare Providers: seriousbroker.it  This test is not yet approved or cleared by the [place]  FDA and has been authorized for detection and/or diagnosis of SARS-CoV-2 by FDA under an Emergency Use Authorization (EUA). This EUA will remain in effect (meaning this test can be used) for the duration of the COVID-19 declaration under Section 564(b)(1) of the Act, 21 U.S.C. section 360bbb-3(b)(1), unless the authorization is terminated or revoked.     Resp Syncytial Virus by PCR NEGATIVE NEGATIVE Final    Comment: (NOTE) Fact Sheet for Patients: bloggercourse.com  Fact Sheet for Healthcare Providers: seriousbroker.it  This test is not yet approved or cleared by the [place]  FDA and has been authorized for detection and/or diagnosis of SARS-CoV-2 by FDA under an Emergency Use Authorization (EUA). This EUA will remain in effect (meaning this test can be used) for the duration of the COVID-19 declaration under Section 564(b)(1) of the Act, 21 U.S.C. section 360bbb-3(b)(1), unless the authorization is terminated or revoked.  Performed at [facility], [address] [place], [place] [address]     Radiology Studies: No results found.  Scheduled Meds:  atorvastatin   40 mg Oral QPM   buPROPion   150 mg Oral q morning   carbamazepine   200 mg Oral QHS   cephALEXin   500 mg Oral Q6H   insulin  aspart  0-5 Units Subcutaneous QHS    insulin  aspart  0-9 Units Subcutaneous TID WC   levETIRAcetam   500 mg Oral BID   senna-docusate  1 tablet Oral BID   venlafaxine  XR  150 mg Oral Q breakfast   Continuous Infusions:     LOS: 0 days    Time spent: 35 mins    Darcel Dawley, MD [facility]   If 7PM-7AM, please contact night-coverage

## 2023-02-28 NOTE — Progress Notes (Signed)
 Subjective: No further bleeding. No abdominal pain.  Objective: Vital signs in last 24 hours: Temp:  [98.2 F (36.8 C)-98.8 F (37.1 C)] 98.7 F (37.1 C) (01/09 1148) Pulse Rate:  [102-132] 116 (01/09 1148) Resp:  [18] 18 (01/09 0656) BP: (121-151)/(51-66) 151/61 (01/09 1148) SpO2:  [95 %-98 %] 97 % (01/09 1148) Weight change:  Last BM Date : 02/20/23  PE: GEN:  NAD NEURO:  No encephalopathy RESP:  No visible distress  Lab Results: CBC    Component Value Date/Time   WBC 5.8 02/28/2023 0539   RBC 3.76 (L) 02/28/2023 0539   HGB 11.3 (L) 02/28/2023 0539   HGB 12.0 03/13/2022 0812   HCT 35.6 (L) 02/28/2023 0539   HCT 37.0 03/13/2022 0812   PLT 173 02/28/2023 0539   PLT 204 03/13/2022 0812   MCV 94.7 02/28/2023 0539   MCV 90 03/13/2022 0812   MCH 30.1 02/28/2023 0539   MCHC 31.7 02/28/2023 0539   RDW 13.1 02/28/2023 0539   RDW 12.6 03/13/2022 0812   LYMPHSABS 1.6 02/23/2023 1724   LYMPHSABS 1.6 03/13/2022 0812   MONOABS 0.5 02/23/2023 1724   EOSABS 0.0 02/23/2023 1724   EOSABS 0.0 03/13/2022 0812   BASOSABS 0.0 02/23/2023 1724   BASOSABS 0.0 03/13/2022 0812  CMP     Component Value Date/Time   NA 136 02/28/2023 0539   NA 141 03/13/2022 0812   K 3.6 02/28/2023 0539   CL 103 02/28/2023 0539   CO2 22 02/28/2023 0539   GLUCOSE 111 (H) 02/28/2023 0539   BUN 11 02/28/2023 0539   BUN 19 03/13/2022 0812   CREATININE 1.34 (H) 02/28/2023 0539   CALCIUM  8.3 (L) 02/28/2023 0539   PROT 8.7 (H) 02/23/2023 1724   PROT 7.2 03/13/2022 0812   ALBUMIN 4.1 02/23/2023 1724   ALBUMIN 3.9 03/13/2022 0812   AST 21 02/23/2023 1724   ALT 12 02/23/2023 1724   ALKPHOS 115 02/23/2023 1724   BILITOT 0.4 02/23/2023 1724   BILITOT <0.2 03/13/2022 0812   EGFR 45 (L) 03/13/2022 0812   GFRNONAA 44 (L) 02/28/2023 0539   Assessment:   Hematochezia, resolved. Acute blood loss anemia.  Plan:   Advance diet as tolerated. No GI procedures planned. [facility] will sign-off; please  call with questions, thank you for the consultation.   Summer [name] 02/28/2023, 12:16 PM   Cell [phone] If no answer or after 5 PM call [phone]

## 2023-03-01 ENCOUNTER — Observation Stay (HOSPITAL_BASED_OUTPATIENT_CLINIC_OR_DEPARTMENT_OTHER): Payer: Medicare Other

## 2023-03-01 DIAGNOSIS — R9431 Abnormal electrocardiogram [ECG] [EKG]: Secondary | ICD-10-CM

## 2023-03-01 DIAGNOSIS — I951 Orthostatic hypotension: Secondary | ICD-10-CM | POA: Diagnosis not present

## 2023-03-01 DIAGNOSIS — R000 Tachycardia, unspecified: Secondary | ICD-10-CM | POA: Diagnosis not present

## 2023-03-01 DIAGNOSIS — R55 Syncope and collapse: Secondary | ICD-10-CM | POA: Diagnosis not present

## 2023-03-01 LAB — GLUCOSE, CAPILLARY
Glucose-Capillary: 102 mg/dL — ABNORMAL HIGH (ref 70–99)
Glucose-Capillary: 99 mg/dL (ref 70–99)

## 2023-03-01 LAB — PHOSPHORUS: Phosphorus: 2.3 mg/dL — ABNORMAL LOW (ref 2.5–4.6)

## 2023-03-01 LAB — TSH: TSH: 2.111 u[IU]/mL (ref 0.350–4.500)

## 2023-03-01 LAB — ECHOCARDIOGRAM COMPLETE
Area-P 1/2: 3.42 cm2
Calc EF: 60 %
Height: 65 in
S' Lateral: 2.4 cm
Single Plane A2C EF: 57.8 %
Single Plane A4C EF: 61.1 %
Weight: 3536 [oz_av]

## 2023-03-01 LAB — CBC
HCT: 33.4 % — ABNORMAL LOW (ref 36.0–46.0)
Hemoglobin: 10.4 g/dL — ABNORMAL LOW (ref 12.0–15.0)
MCH: 29.5 pg (ref 26.0–34.0)
MCHC: 31.1 g/dL (ref 30.0–36.0)
MCV: 94.9 fL (ref 80.0–100.0)
Platelets: 181 10*3/uL (ref 150–400)
RBC: 3.52 MIL/uL — ABNORMAL LOW (ref 3.87–5.11)
RDW: 13.1 % (ref 11.5–15.5)
WBC: 5.1 10*3/uL (ref 4.0–10.5)
nRBC: 0 % (ref 0.0–0.2)

## 2023-03-01 LAB — VITAMIN B12: Vitamin B-12: 211 pg/mL (ref 180–914)

## 2023-03-01 LAB — BASIC METABOLIC PANEL
Anion gap: 6 (ref 5–15)
BUN: 11 mg/dL (ref 8–23)
CO2: 24 mmol/L (ref 22–32)
Calcium: 8.4 mg/dL — ABNORMAL LOW (ref 8.9–10.3)
Chloride: 105 mmol/L (ref 98–111)
Creatinine, Ser: 1.05 mg/dL — ABNORMAL HIGH (ref 0.44–1.00)
GFR, Estimated: 59 mL/min — ABNORMAL LOW (ref >60)
Glucose, Bld: 96 mg/dL (ref 70–99)
Potassium: 3.8 mmol/L (ref 3.5–5.1)
Sodium: 135 mmol/L (ref 135–145)

## 2023-03-01 LAB — IRON AND TIBC
Iron: 43 ug/dL (ref 28–170)
Saturation Ratios: 18 % (ref 10.4–31.8)
TIBC: 238 ug/dL — ABNORMAL LOW (ref 250–450)
UIBC: 195 ug/dL

## 2023-03-01 LAB — MAGNESIUM: Magnesium: 2.1 mg/dL (ref 1.7–2.4)

## 2023-03-01 LAB — FOLATE: Folate: 7.7 ng/mL (ref >5.9)

## 2023-03-01 LAB — VITAMIN D 25 HYDROXY (VIT D DEFICIENCY, FRACTURES): Vit D, 25-Hydroxy: 39.94 ng/mL (ref 30–100)

## 2023-03-01 MED ORDER — K PHOS MONO-SOD PHOS DI & MONO 155-852-130 MG PO TABS
500.0000 mg | ORAL_TABLET | Freq: Once | ORAL | Status: AC
  Administered 2023-03-01: 500 mg via ORAL
  Filled 2023-03-01: qty 2

## 2023-03-01 MED ORDER — ACETAMINOPHEN 325 MG PO TABS: 650.0000 mg | ORAL_TABLET | Freq: Four times a day (QID) | ORAL | Status: AC | PRN

## 2023-03-01 MED ORDER — VITAMIN B-12 1000 MCG PO TABS: 1000.0000 ug | ORAL_TABLET | Freq: Every day | ORAL | 0 refills | Status: AC

## 2023-03-01 NOTE — Progress Notes (Signed)
   Patient Name: Summer [name] Date of Encounter: 03/01/2023 [facility] Cardiologist: Annabella Scarce, MD   Interval Summary  .    Feeling much better.  No more palpitations.   Vital Signs .    Vitals:   02/28/23 1803 02/28/23 2010 03/01/23 0002 03/01/23 0530  BP: (!) 149/65 (!) 119/57 132/71 132/68  Pulse: 88 77 73 81  Resp:      Temp:  98.4 F (36.9 C) 98.4 F (36.9 C) 98.3 F (36.8 C)  TempSrc:      SpO2:  97% 94% 92%  Weight:      Height:       No intake or output data in the 24 hours ending 03/01/23 1459    02/25/2023    7:22 PM 02/25/2023    7:12 PM 02/23/2023    5:21 PM  Last 3 Weights  Weight (lbs) 221 lb 220 lb 7.4 oz 221 lb  Weight (kg) 100.245 kg 100 kg 100.245 kg      Telemetry/ECG    Sinus rhythm.  PVC - Personally Reviewed  Physical Exam .   GEN: No acute distress.   Neck: No JVD Cardiac: RRR, no murmurs, rubs, or gallops.  Respiratory: Clear to auscultation bilaterally. GI: Soft, nontender, non-distended  MS: No edema  Assessment & Plan .     70F with hypertension, hyperlipidemia, diabetes, GERD, CKD 3b, seizures and BPPV here with nausea/vomiting after starting Ozempic.  Cardiology consulted for tachycardia and EKG changes.   # Tachycardia New onset palpitations with a history of hypertension treated with Metoprolol  25mg  twice daily, which was recently reduced due to hypotension.  Sinus tachycardia noted on telemetry.  Possible rebound tachycardia due to abrupt cessation of Metoprolol  and intravascular volume depletion.  Now resolved after resuming metoprolol .  TSH wnl.  No structural abnormalities on echo.    # Volume depletion: Recent episode of severe dehydration secondary to nausea and vomiting, possibly related to initiation of Ozempic.  She is going to check what dose she received when she gets home.     # Hypertension History of hypertension, recently had dose of Metoprolol  reduced due to hypotension. -Monitor blood pressure  closely with adjustment of Metoprolol . -Continue to hold hydrochlorothiazide -Track BP at home and bring to follow up.    # Coronary and aortic atherosclerosis:  EKG with anterior TW inversion which are slightly more prominent but chronic.  If LVEF is within normal limits, recommend outpatient coronary CT-A given her family history of CAD and coronary clacification on CT.   LDL goal <70.  Continue atorvastatin .      For questions or updates, please contact [facility] Please consult [email or web address] for contact info under        Signed, Annabella Scarce, MD

## 2023-03-01 NOTE — Discharge Summary (Signed)
 [facility] Discharge Summary   Patient: Summer [name] FMW:[id number]  PCP: Teresa Channel, MD  Date of admission: 02/25/2023   Date of discharge:  03/01/2023     Discharge Diagnoses:  Principal Problem:   Near syncope Active Problems:   Type 2 diabetes mellitus (HCC)   Orthostatic hypotension   AKI (acute kidney injury) (HCC)   Nausea and vomiting   UTI (urinary tract infection)   Hypokalemia   Admitted From: Home Disposition:  Home   Recommendations for Outpatient Follow-up:  Follow with PCP in 1 week, continue to monitor BP and heart rate at home and follow with PCP to titrate medication accordingly.  Discontinued hydrochlorothiazide due to electrolyte imbalance, continued metoprolol  25 mg p.o. twice daily due to high blood pressure and palpitations. Vitamin B12 level 211, goal 400.  continue B12 oral supplement for 3 months, Repeat B12 level after 3 to 6 months,  Follow-up with cardiology if persistent palpitations for possible cardiac monitor as an outpatient. Follow up LABS/TEST:  As above   Follow-up Information     Teresa Channel, MD Follow up in 1 week(s).   Specialty: Family Medicine Contact information: [address], [address] [place] [address] [phone]         Raford Riggs, MD Follow up in 1 week(s).   Specialty: Cardiology Why: for cardiac monitor if persistent palpitations Contact information: [address] [place] [address] [phone]                Diet recommendation: Cardiac and Carb modified diet  Activity: The patient is advised to gradually reintroduce usual activities, as tolerated  Discharge Condition: stable  Code Status: Full code   History of present illness: As per the H and P dictated on admission Hospital Course:  This 68 yrs old female with PMH significant for type 2 diabetes, hypertension, hyperlipidemia, GERD, CKD stage IIIb, obesity, pulmonary nodule, depression, anxiety, left eye  blindness, BPPV, seizure disorder presented to ED after she had syncopal episode at home and had initially refused transport but then had a near syncopal episode with EMS and brought into the ED. She reported nausea and vomiting for the last few days in the setting of Ozempic use.  Patient was orthostatic in the ED.  Patient has also noticed blood on the toilet paper today but no melena or hematochezia.  CT abdomen pelvis negative for acute findings.  Patient was admitted for near syncope /orthostatic hypotension due to dehydration in the setting of nausea and vomiting    Assessment & Plan: # Near syncope/Orthostatic hypotension: Due to severe dehydration in the setting of vomiting and not taking oral. Denies any LOC.  She continued to have several episodes of near syncope.  Blood pressure did drop from 149 systolic lying to 115 systolic standing.  Patient also reports blood in the stool.  CT did show left colonic diverticulosis.  Hemoccult positive but no melena or hematochezia noted on rectal exam. S/p IV fluid hydration and repeat orthostatics improved, currently patient is asymptomatic, no dizziness. She is not on anticoagulation.  Fall precautions. GI consulted, likely diverticular bleed, no plan for any invasive intervention. GI signed off.   # AKI on CKD stage IIIb: Likely prerenal in etiology from severe dehydration given vomiting for several days.  Creatinine 1.9 and baseline appears to be around 1.4.  CT without evidence of obstructive uropathy.   Serum creatinine back to baseline.   # Nausea and vomiting / UTI: Nausea and vomiting likely  related to Ozempic use but UTI could also possibly be contributing.   She was seen in the ED 2 days ago for these symptoms and lipase and LFTs were normal on labs.   CT abdomen pelvis done at this time negative for acute findings.  UA done during recent ED visit was showing small amount of leukocytes and microscopy with 21-50 WBCs and rare bacteria.   Urine culture grew >100,000 colonies of gram-negative rods and she was prescribed Keflex .   No fever, leukocytosis, or signs of sepsis at this time.   S/p ceftriaxone  given during hospital stay.  Resumed Keflex  home dose on discharge.   # Hypokalemia: Replaced. Resolved  # Type 2 diabetes: Last A1c 6.0  9/23, continue diabetic diet and follow with PCP. # Hypertension: patient had orthostatic hypotension, medications were held during hospital stay.  Resumed metoprolol  on discharge, discontinued hydrochlorothiazide.  Monitor BP at home and follow with PCP to titrate medication accordingly. # Hyperlipidemia: Continue Lipitor. # Anxiety and depression: Continue venlafaxine  150 mg daily # Seizure disorder: Keppra  500 twice daily.  Body mass index is 36.78 kg/m.  Nutrition Interventions:  - Patient was instructed, not to drive, operate heavy machinery, perform activities at heights, swimming or participation in water activities or provide baby sitting services while on Pain, Sleep and Anxiety Medications; until her outpatient Physician has advised to do so again.  - Also recommended to not to take more than prescribed Pain, Sleep and Anxiety Medications.  Patient was ambulatory without any assistance. On the day of the discharge the patient's vitals were stable, and no other acute medical condition were reported by patient. the patient was felt safe to be discharge at Home.  Consultants: Home Procedures: Home  Discharge Exam: General: Appear in no distress, no Rash; Oral Mucosa Clear, moist. Cardiovascular: S1 and S2 Present, no Murmur, Respiratory: normal respiratory effort, Bilateral Air entry present and no Crackles, no wheezes Abdomen: Bowel Sound present, Soft and no tenderness, no hernia Extremities: no Pedal edema, no calf tenderness Neurology: alert and oriented to time, place, and person affect appropriate.  Filed Weights   02/25/23 1912 02/25/23 1922  Weight: 100 kg 100.2 kg    Vitals:   03/01/23 0002 03/01/23 0530  BP: 132/71 132/68  Pulse: 73 81  Resp:    Temp: 98.4 F (36.9 C) 98.3 F (36.8 C)  SpO2: 94% 92%    DISCHARGE MEDICATION: Allergies as of 03/01/2023       Reactions   Cinnamon    Other reaction(s): lip/throat swelling   Meloxicam    Other reaction(s): dizzy   Tape Other (See Comments)   Plastic tape only.  REACTION:  Skin redness.   Chlorhexidine  Itching   (Irritation) Skin redness   Gabapentin  Anxiety        Medication List     STOP taking these medications    hydrochlorothiazide 25 MG tablet Commonly known as: HYDRODIURIL   ondansetron  4 MG tablet Commonly known as: Zofran        TAKE these medications    acetaminophen  325 MG tablet Commonly known as: TYLENOL  Take 2 tablets (650 mg total) by mouth every 6 (six) hours as needed for mild pain (pain score 1-3), fever or headache. What changed:  medication strength how much to take reasons to take this   ALPRAZolam  0.25 MG tablet Commonly known as: XANAX  Take 0.125-0.25 mg by mouth daily as needed for anxiety.   aspirin  EC 81 MG tablet Take 81 mg by mouth in  the morning. Swallow whole.   atorvastatin  40 MG tablet Commonly known as: LIPITOR Take 40 mg by mouth every evening.   buPROPion  150 MG 24 hr tablet Commonly known as: WELLBUTRIN  XL Take 150 mg by mouth every morning.   carbamazepine  200 MG tablet Commonly known as: TEGRETOL  TAKE 2 TABS BY MOUTH EVERY MORNING AND 1 TAB EVERY EVENING What changed:  how much to take how to take this when to take this   cephALEXin  500 MG capsule Commonly known as: KEFLEX  Take 1 capsule (500 mg total) by mouth 4 (four) times daily.   cyanocobalamin  1000 MCG tablet Commonly known as: VITAMIN B12 Take 1 tablet (1,000 mcg total) by mouth daily.   desvenlafaxine 100 MG 24 hr tablet Commonly known as: PRISTIQ Take 100 mg by mouth in the morning.   EPINEPHrine 0.3 mg/0.3 mL Soaj injection Commonly known as:  EPI-PEN Inject 0.3 mg into the muscle as needed for anaphylaxis.   Farxiga 5 MG Tabs tablet Generic drug: dapagliflozin propanediol Take 5 mg by mouth daily.   ipratropium 0.03 % nasal spray Commonly known as: ATROVENT Place 2 sprays into both nostrils 2 (two) times daily as needed (allergies).   levETIRAcetam  500 MG tablet Commonly known as: KEPPRA  Take 1 tablet (500 mg total) by mouth 2 (two) times daily.   metoprolol  tartrate 25 MG tablet Commonly known as: LOPRESSOR  Take 1 tablet (25 mg total) by mouth 2 (two) times daily.   nitroGLYCERIN  0.4 MG SL tablet Commonly known as: NITROSTAT  Place 0.4 mg under the tongue every 5 (five) minutes x 3 doses as needed for chest pain.   ondansetron  4 MG disintegrating tablet Commonly known as: ZOFRAN -ODT Take 1 tablet (4 mg total) by mouth every 8 (eight) hours as needed for vomiting.   triamcinolone  ointment 0.1 % Commonly known as: KENALOG  Apply 1 application topically 2 (two) times daily. What changed:  when to take this reasons to take this   Vitamin D3 25 MCG (1000 UT) Caps Take 1,000 Units by mouth daily.       Allergies  Allergen Reactions   Cinnamon     Other reaction(s): lip/throat swelling   Meloxicam     Other reaction(s): dizzy   Tape Other (See Comments)    Plastic tape only.  REACTION:  Skin redness.   Chlorhexidine  Itching    (Irritation) Skin redness   Gabapentin  Anxiety   Discharge Instructions     Call MD for:  difficulty breathing, headache or visual disturbances   Complete by: As directed    Call MD for:  extreme fatigue   Complete by: As directed    Call MD for:  persistant dizziness or light-headedness   Complete by: As directed    Call MD for:  persistant nausea and vomiting   Complete by: As directed    Call MD for:  severe uncontrolled pain   Complete by: As directed    Call MD for:  temperature >100.4   Complete by: As directed    Diet - low sodium heart healthy   Complete by: As  directed    Diet Carb Modified   Complete by: As directed    Discharge instructions   Complete by: As directed    Follow with PCP in 1 week, continue to monitor BP and heart rate at home and follow with PCP to titrate medication accordingly.  Discontinued hydrochlorothiazide due to electrolyte imbalance, continued metoprolol  25 mg p.o. twice daily due to high blood pressure and  palpitations. Vitamin B12 level 211, goal 400.  continue B12 oral supplement for 3 months, Repeat B12 level after 3 to 6 months,  Follow-up with cardiology if persistent palpitations for possible cardiac monitor as an outpatient.   Increase activity slowly   Complete by: As directed        The results of significant diagnostics from this hospitalization (including imaging, microbiology, ancillary and laboratory) are listed below for reference.    Significant Diagnostic Studies: ECHOCARDIOGRAM COMPLETE Result Date: 03/01/2023    ECHOCARDIOGRAM REPORT   Patient Name:   Summer [name] Date of Exam: 03/01/2023 Medical Rec #:  [id number]        Height:       65.0 in Accession #:    [id number]       Weight:       221.0 lb Date of Birth:  21-Apr-1955        BSA:          2.064 m Patient Age:    67 years         BP:           132/68 mmHg Patient Gender: F                HR:           74 bpm. Exam Location:  Inpatient Procedure: 2D Echo, Cardiac Doppler and Color Doppler Indications:    R94.31 Abnormal EKG  History:        Patient has no prior history of Echocardiogram examinations.                 Abnormal ECG, Arrythmias:Tachycardia, Signs/Symptoms:Syncope,                 Dizziness/Lightheadedness and Chest Pain; Risk Factors:Diabetes,                 Hypertension and Dyslipidemia.  Sonographer:    Ellouise Mose RDCS Referring Phys: [id number] [facility]  Sonographer Comments: Image acquisition challenging due to patient body habitus. IMPRESSIONS  1. Left ventricular ejection fraction, by estimation, is 60 to 65%. The left  ventricle has normal function. The left ventricle has no regional wall motion abnormalities. There is mild left ventricular hypertrophy. Left ventricular diastolic parameters are consistent with Grade I diastolic dysfunction (impaired relaxation).  2. Right ventricular systolic function is normal. The right ventricular size is normal. Tricuspid regurgitation signal is inadequate for assessing PA pressure.  3. The mitral valve is normal in structure. Mild mitral valve regurgitation. No evidence of mitral stenosis.  4. The aortic valve is tricuspid. There is mild calcification of the aortic valve. Aortic valve regurgitation is not visualized. No aortic stenosis is present.  5. The inferior vena cava is normal in size with greater than 50% respiratory variability, suggesting right atrial pressure of 3 mmHg. FINDINGS  Left Ventricle: Left ventricular ejection fraction, by estimation, is 60 to 65%. The left ventricle has normal function. The left ventricle has no regional wall motion abnormalities. The left ventricular internal cavity size was normal in size. There is  mild left ventricular hypertrophy. Left ventricular diastolic parameters are consistent with Grade I diastolic dysfunction (impaired relaxation). Right Ventricle: The right ventricular size is normal. No increase in right ventricular wall thickness. Right ventricular systolic function is normal. Tricuspid regurgitation signal is inadequate for assessing PA pressure. Left Atrium: Left atrial size was normal in size. Right Atrium: Right atrial size was normal in size. Pericardium: Trivial pericardial effusion is present.  Mitral Valve: The mitral valve is normal in structure. Mild mitral valve regurgitation. No evidence of mitral valve stenosis. Tricuspid Valve: The tricuspid valve is normal in structure. Tricuspid valve regurgitation is not demonstrated. Aortic Valve: The aortic valve is tricuspid. There is mild calcification of the aortic valve. Aortic  valve regurgitation is not visualized. No aortic stenosis is present. Pulmonic Valve: The pulmonic valve was normal in structure. Pulmonic valve regurgitation is not visualized. Aorta: The aortic root is normal in size and structure. Venous: The inferior vena cava is normal in size with greater than 50% respiratory variability, suggesting right atrial pressure of 3 mmHg. IAS/Shunts: No atrial level shunt detected by color flow Doppler.  LEFT VENTRICLE PLAX 2D LVIDd:         3.60 cm     Diastology LVIDs:         2.40 cm     LV e' medial:    5.55 cm/s LV PW:         1.50 cm     LV E/e' medial:  16.8 LV IVS:        1.20 cm     LV e' lateral:   9.03 cm/s LVOT diam:     2.30 cm     LV E/e' lateral: 10.4 LV SV:         83 LV SV Index:   40 LVOT Area:     4.15 cm  LV Volumes (MOD) LV vol d, MOD A2C: 54.3 ml LV vol d, MOD A4C: 64.7 ml LV vol s, MOD A2C: 22.9 ml LV vol s, MOD A4C: 25.2 ml LV SV MOD A2C:     31.4 ml LV SV MOD A4C:     64.7 ml LV SV MOD BP:      36.0 ml RIGHT VENTRICLE             IVC RV S prime:     11.00 cm/s  IVC diam: 1.30 cm TAPSE (M-mode): 0.9 cm LEFT ATRIUM             Index        RIGHT ATRIUM          Index LA diam:        3.10 cm 1.50 cm/m   RA Area:     9.18 cm LA Vol (A2C):   31.7 ml 15.36 ml/m  RA Volume:   17.20 ml 8.33 ml/m LA Vol (A4C):   19.6 ml 9.50 ml/m LA Biplane Vol: 25.0 ml 12.11 ml/m  AORTIC VALVE             PULMONIC VALVE LVOT Vmax:   95.70 cm/s  PR End Diast Vel: 1.41 msec LVOT Vmean:  64.500 cm/s LVOT VTI:    0.199 m  AORTA Ao Root diam: 3.00 cm Ao Asc diam:  2.90 cm MITRAL VALVE MV Area (PHT): 3.42 cm     SHUNTS MV Decel Time: 222 msec     Systemic VTI:  0.20 m MV E velocity: 93.50 cm/s   Systemic Diam: 2.30 cm MV A velocity: 110.00 cm/s MV E/A ratio:  0.85 Dalton McleanMD Electronically signed by Ezra Kanner Signature Date/Time: 03/01/2023/8:37:02 AM    Final    CT ABDOMEN PELVIS WO CONTRAST Result Date: 02/25/2023 CLINICAL DATA:  Abdominal pain, acute, nonlocalized.   Syncope EXAM: CT ABDOMEN AND PELVIS WITHOUT CONTRAST TECHNIQUE: Multidetector CT imaging of the abdomen and pelvis was performed following the standard protocol without IV contrast. RADIATION DOSE REDUCTION: This  exam was performed according to the departmental dose-optimization program which includes automated exposure control, adjustment of the mA and/or kV according to patient size and/or use of iterative reconstruction technique. COMPARISON:  01/31/2022 FINDINGS: Lower chest: No acute abnormality. Hepatobiliary: Stable small cyst in the left hepatic lobe. No suspicious hepatic abnormality. Gallbladder unremarkable. No biliary ductal dilatation. Pancreas: No focal abnormality or ductal dilatation. Spleen: No focal abnormality.  Normal size. Adrenals/Urinary Tract: No adrenal abnormality. No focal renal abnormality. No stones or hydronephrosis. Urinary bladder is unremarkable. Stomach/Bowel: Left colonic diverticulosis. No active diverticulitis. Stomach and small bowel decompressed, unremarkable. Vascular/Lymphatic: No evidence of aneurysm or adenopathy. Reproductive: Prior hysterectomy.  No adnexal masses. Other:  No free fluid or free air. Musculoskeletal: Prior left hip replacement. No acute bony abnormality. IMPRESSION: No acute findings in the abdomen or pelvis. Left colonic diverticulosis. Electronically Signed   By: Franky Crease M.D.   On: 02/25/2023 21:09   DG Chest 2 View Result Date: 02/23/2023 CLINICAL DATA:  Infection EXAM: CHEST - 2 VIEW COMPARISON:  X-ray and CTA 10/29/2021. FINDINGS: No consolidation, pneumothorax or effusion. Normal cardiopericardial silhouette without edema. Calcified aorta. Fixation hardware along the right clavicle. Degenerative changes of the spine. There is moderate compression deformity of the midthoracic spine vertebral level, unchanged from previous. IMPRESSION: No acute cardiopulmonary disease. Electronically Signed   By: Ranell Bring M.D.   On: 02/23/2023 18:06     Microbiology: Recent Results (from the past 240 hours)  Resp panel by RT-PCR (RSV, Flu A&B, Covid) Anterior Nasal Swab     Status: None   Collection Time: 02/23/23  5:23 PM   Specimen: Anterior Nasal Swab  Result Value Ref Range Status   SARS Coronavirus 2 by RT PCR NEGATIVE NEGATIVE Final    Comment: (NOTE) SARS-CoV-2 target nucleic acids are NOT DETECTED.  The SARS-CoV-2 RNA is generally detectable in upper respiratory specimens during the acute phase of infection. The lowest concentration of SARS-CoV-2 viral copies this assay can detect is 138 copies/mL. A negative result does not preclude SARS-Cov-2 infection and should not be used as the sole basis for treatment or other patient management decisions. A negative result may occur with  improper specimen collection/handling, submission of specimen other than nasopharyngeal swab, presence of viral mutation(s) within the areas targeted by this assay, and inadequate number of viral copies(<138 copies/mL). A negative result must be combined with clinical observations, patient history, and epidemiological information. The expected result is Negative.  Fact Sheet for Patients:  bloggercourse.com  Fact Sheet for Healthcare Providers:  seriousbroker.it  This test is no t yet approved or cleared by the [place]  FDA and  has been authorized for detection and/or diagnosis of SARS-CoV-2 by FDA under an Emergency Use Authorization (EUA). This EUA will remain  in effect (meaning this test can be used) for the duration of the COVID-19 declaration under Section 564(b)(1) of the Act, 21 U.S.C.section 360bbb-3(b)(1), unless the authorization is terminated  or revoked sooner.       Influenza A by PCR NEGATIVE NEGATIVE Final   Influenza B by PCR NEGATIVE NEGATIVE Final    Comment: (NOTE) The Xpert Xpress SARS-CoV-2/FLU/RSV plus assay is intended as an aid in the diagnosis of  influenza from Nasopharyngeal swab specimens and should not be used as a sole basis for treatment. Nasal washings and aspirates are unacceptable for Xpert Xpress SARS-CoV-2/FLU/RSV testing.  Fact Sheet for Patients: bloggercourse.com  Fact Sheet for Healthcare Providers: seriousbroker.it  This test is not yet approved or  cleared by the [place]  FDA and has been authorized for detection and/or diagnosis of SARS-CoV-2 by FDA under an Emergency Use Authorization (EUA). This EUA will remain in effect (meaning this test can be used) for the duration of the COVID-19 declaration under Section 564(b)(1) of the Act, 21 U.S.C. section 360bbb-3(b)(1), unless the authorization is terminated or revoked.     Resp Syncytial Virus by PCR NEGATIVE NEGATIVE Final    Comment: (NOTE) Fact Sheet for Patients: bloggercourse.com  Fact Sheet for Healthcare Providers: seriousbroker.it  This test is not yet approved or cleared by the [place]  FDA and has been authorized for detection and/or diagnosis of SARS-CoV-2 by FDA under an Emergency Use Authorization (EUA). This EUA will remain in effect (meaning this test can be used) for the duration of the COVID-19 declaration under Section 564(b)(1) of the Act, 21 U.S.C. section 360bbb-3(b)(1), unless the authorization is terminated or revoked.  Performed at [facility], [address] [place], [place] [address]   Urine Culture     Status: Abnormal   Collection Time: 02/23/23 11:22 PM   Specimen: Urine, Random  Result Value Ref Range Status   Specimen Description   Final    URINE, RANDOM Performed at [facility], [address] [place], [place] [address]    Special Requests   Final    NONE Reflexed from [id number] Performed at [facility], [address], [place], [place] [address]     Culture >=100,000 COLONIES/mL ESCHERICHIA COLI (A)  Final   Report Status 02/26/2023 FINAL  Final   Organism ID, Bacteria ESCHERICHIA COLI (A)  Final      Susceptibility   Escherichia coli - MIC*    AMPICILLIN 4 SENSITIVE Sensitive     CEFAZOLIN  <=4 SENSITIVE Sensitive     CEFEPIME <=0.12 SENSITIVE Sensitive     CEFTRIAXONE  <=0.25 SENSITIVE Sensitive     CIPROFLOXACIN >=4 RESISTANT Resistant     GENTAMICIN <=1 SENSITIVE Sensitive     IMIPENEM <=0.25 SENSITIVE Sensitive     NITROFURANTOIN <=16 SENSITIVE Sensitive     TRIMETH/SULFA <=20 SENSITIVE Sensitive     AMPICILLIN/SULBACTAM <=2 SENSITIVE Sensitive     PIP/TAZO <=4 SENSITIVE Sensitive ug/mL    * >=100,000 COLONIES/mL ESCHERICHIA COLI  Resp panel by RT-PCR (RSV, Flu A&B, Covid) Anterior Nasal Swab     Status: None   Collection Time: 02/25/23  9:09 PM   Specimen: Anterior Nasal Swab  Result Value Ref Range Status   SARS Coronavirus 2 by RT PCR NEGATIVE NEGATIVE Final    Comment: (NOTE) SARS-CoV-2 target nucleic acids are NOT DETECTED.  The SARS-CoV-2 RNA is generally detectable in upper respiratory specimens during the acute phase of infection. The lowest concentration of SARS-CoV-2 viral copies this assay can detect is 138 copies/mL. A negative result does not preclude SARS-Cov-2 infection and should not be used as the sole basis for treatment or other patient management decisions. A negative result may occur with  improper specimen collection/handling, submission of specimen other than nasopharyngeal swab, presence of viral mutation(s) within the areas targeted by this assay, and inadequate number of viral copies(<138 copies/mL). A negative result must be combined with clinical observations, patient history, and epidemiological information. The expected result is Negative.  Fact Sheet for Patients:  bloggercourse.com  Fact Sheet for Healthcare Providers:   seriousbroker.it  This test is no t yet approved or cleared by the [place]  FDA and  has been authorized for detection and/or diagnosis of SARS-CoV-2 by  FDA under an Emergency Use Authorization (EUA). This EUA will remain  in effect (meaning this test can be used) for the duration of the COVID-19 declaration under Section 564(b)(1) of the Act, 21 U.S.C.section 360bbb-3(b)(1), unless the authorization is terminated  or revoked sooner.       Influenza A by PCR NEGATIVE NEGATIVE Final   Influenza B by PCR NEGATIVE NEGATIVE Final    Comment: (NOTE) The Xpert Xpress SARS-CoV-2/FLU/RSV plus assay is intended as an aid in the diagnosis of influenza from Nasopharyngeal swab specimens and should not be used as a sole basis for treatment. Nasal washings and aspirates are unacceptable for Xpert Xpress SARS-CoV-2/FLU/RSV testing.  Fact Sheet for Patients: bloggercourse.com  Fact Sheet for Healthcare Providers: seriousbroker.it  This test is not yet approved or cleared by the [place]  FDA and has been authorized for detection and/or diagnosis of SARS-CoV-2 by FDA under an Emergency Use Authorization (EUA). This EUA will remain in effect (meaning this test can be used) for the duration of the COVID-19 declaration under Section 564(b)(1) of the Act, 21 U.S.C. section 360bbb-3(b)(1), unless the authorization is terminated or revoked.     Resp Syncytial Virus by PCR NEGATIVE NEGATIVE Final    Comment: (NOTE) Fact Sheet for Patients: bloggercourse.com  Fact Sheet for Healthcare Providers: seriousbroker.it  This test is not yet approved or cleared by the [place]  FDA and has been authorized for detection and/or diagnosis of SARS-CoV-2 by FDA under an Emergency Use Authorization (EUA). This EUA will remain in effect (meaning this test can be used) for  the duration of the COVID-19 declaration under Section 564(b)(1) of the Act, 21 U.S.C. section 360bbb-3(b)(1), unless the authorization is terminated or revoked.  Performed at [facility], [address], [place], [place] [address]      Labs: CBC: Recent Labs  Lab 02/23/23 1724 02/25/23 1936 02/26/23 0453 02/27/23 0525 02/28/23 0539 03/01/23 0541  WBC 6.9 9.4 7.7 5.8 5.8 5.1  NEUTROABS 4.7  --   --   --   --   --   HGB 12.3 12.3 11.0* 10.4* 11.3* 10.4*  HCT 37.8 37.9 34.1* 34.0* 35.6* 33.4*  MCV 91.7 94.0 93.2 96.6 94.7 94.9  PLT 221 204 196 156 173 181   Basic Metabolic Panel: Recent Labs  Lab 02/25/23 1936 02/26/23 0453 02/27/23 0525 02/28/23 0539 03/01/23 0541  NA 135 134* 134* 136 135  K 3.3* 3.1* 3.6 3.6 3.8  CL 99 102 104 103 105  CO2 24 22 20* 22 24  GLUCOSE 125* 105* 107* 111* 96  BUN 20 18 12 11 11   CREATININE 1.91* 1.60* 1.12* 1.34* 1.05*  CALCIUM  8.7* 7.8* 7.6* 8.3* 8.4*  MG  --  2.3 2.2 1.9 2.1  PHOS  --   --  1.9* 2.7 2.3*   Liver Function Tests: Recent Labs  Lab 02/23/23 1724  AST 21  ALT 12  ALKPHOS 115  BILITOT 0.4  PROT 8.7*  ALBUMIN 4.1   Recent Labs  Lab 02/24/23 0015  LIPASE 25   No results for input(s): AMMONIA in the last 168 hours. Cardiac Enzymes: No results for input(s): CKTOTAL, CKMB, CKMBINDEX, TROPONINI in the last 168 hours. BNP (last 3 results) No results for input(s): BNP in the last 8760 hours. CBG: Recent Labs  Lab 02/28/23 1146 02/28/23 1638 02/28/23 2113 03/01/23 0739 03/01/23 1203  GLUCAP 105* 108* 116* 102* 99    Time spent: 35 minutes  Signed:  Elvan Sor  [facility] 03/01/2023 2:21 PM

## 2023-03-01 NOTE — Plan of Care (Signed)
  Problem: Education: Goal: Ability to describe self-care measures that may prevent or decrease complications (Diabetes Survival Skills Education) will improve Outcome: Progressing   Problem: Coping: Goal: Ability to adjust to condition or change in health will improve Outcome: Progressing   Problem: Health Behavior/Discharge Planning: Goal: Ability to identify and utilize available resources and services will improve Outcome: Progressing

## 2023-03-01 NOTE — Progress Notes (Signed)
 Mobility Specialist - Progress Note   03/01/23 1036  Orthostatic Lying   BP- Lying 144/63  Pulse- Lying 83  Orthostatic Sitting  BP- Sitting 131/62  Pulse- Sitting 85  Orthostatic Standing at 0 minutes  BP- Standing at 0 minutes 94/50  Pulse- Standing at 0 minutes 97  Mobility  Activity Ambulated with assistance in hallway  Level of Assistance Contact guard assist, steadying assist  Assistive Device Other (Comment) (Hallway Rails)  Distance Ambulated (ft) 400 ft  Range of Motion/Exercises Active  Activity Response Tolerated well  Mobility Referral Yes  Mobility visit 1 Mobility  Mobility Specialist Start Time (ACUTE ONLY) 1019  Mobility Specialist Stop Time (ACUTE ONLY) 1036  Mobility Specialist Time Calculation (min) (ACUTE ONLY) 17 min   Orthostatic at 3 min BP - 102/68 mmHg (79 MAP) Pulse - 98 bpm   Pt was found in bed and agreeable to ambulate. No complaints with session. At EOS returned to bed with all needs met. Call bell in reach.  Erminio Leos Mobility Specialist

## 2023-03-01 NOTE — Progress Notes (Signed)
 Rounding Note    Patient Name: Summer [name] Date of Encounter: 03/01/2023  [facility] Cardiologist: Annabella Scarce, MD   Subjective   Pt feels much better today, no cardiac complaints  Inpatient Medications    Scheduled Meds:  atorvastatin   40 mg Oral QPM   buPROPion   150 mg Oral q morning   carbamazepine   200 mg Oral QHS   cephALEXin   500 mg Oral Q6H   insulin  aspart  0-5 Units Subcutaneous QHS   insulin  aspart  0-9 Units Subcutaneous TID WC   levETIRAcetam   500 mg Oral BID   metoprolol  tartrate  25 mg Oral BID   senna-docusate  1 tablet Oral BID   venlafaxine  XR  150 mg Oral Q breakfast   Continuous Infusions:  PRN Meds: acetaminophen  **OR** acetaminophen , ALPRAZolam , calcium  carbonate, ondansetron  (ZOFRAN ) IV   Vital Signs    Vitals:   02/28/23 1803 02/28/23 2010 03/01/23 0002 03/01/23 0530  BP: (!) 149/65 (!) 119/57 132/71 132/68  Pulse: 88 77 73 81  Resp:      Temp:  98.4 F (36.9 C) 98.4 F (36.9 C) 98.3 F (36.8 C)  TempSrc:      SpO2:  97% 94% 92%  Weight:      Height:       No intake or output data in the 24 hours ending 03/01/23 1155    02/25/2023    7:22 PM 02/25/2023    7:12 PM 02/23/2023    5:21 PM  Last 3 Weights  Weight (lbs) 221 lb 220 lb 7.4 oz 221 lb  Weight (kg) 100.245 kg 100 kg 100.245 kg      Telemetry    Sinus rhythm with HR 70-80s - Personally Reviewed  ECG    No new tracings today - Personally Reviewed  Physical Exam   GEN: No acute distress.   Neck: No JVD Cardiac: RRR, no murmurs, rubs, or gallops.  Respiratory: Clear to auscultation bilaterally. GI: Soft, nontender, non-distended  MS: No edema; No deformity. Neuro:  Nonfocal  Psych: Normal affect   Labs    High Sensitivity Troponin:   Recent Labs  Lab 02/25/23 0216 02/25/23 2051  TROPONINIHS 7 8     Chemistry Recent Labs  Lab 02/23/23 1724 02/25/23 1936 02/27/23 0525 02/28/23 0539 03/01/23 0541  NA 136   < > 134* 136 135  K 3.5    < > 3.6 3.6 3.8  CL 98   < > 104 103 105  CO2 30   < > 20* 22 24  GLUCOSE 124*   < > 107* 111* 96  BUN 17   < > 12 11 11   CREATININE 1.66*   < > 1.12* 1.34* 1.05*  CALCIUM  8.9   < > 7.6* 8.3* 8.4*  MG  --    < > 2.2 1.9 2.1  PROT 8.7*  --   --   --   --   ALBUMIN 4.1  --   --   --   --   AST 21  --   --   --   --   ALT 12  --   --   --   --   ALKPHOS 115  --   --   --   --   BILITOT 0.4  --   --   --   --   GFRNONAA 34*   < > 55* 44* 59*  ANIONGAP 8   < > 10 11 6    < > =  values in this interval not displayed.    Lipids No results for input(s): CHOL, TRIG, HDL, LABVLDL, LDLCALC, CHOLHDL in the last 168 hours.  Hematology Recent Labs  Lab 02/27/23 0525 02/28/23 0539 03/01/23 0541  WBC 5.8 5.8 5.1  RBC 3.52* 3.76* 3.52*  HGB 10.4* 11.3* 10.4*  HCT 34.0* 35.6* 33.4*  MCV 96.6 94.7 94.9  MCH 29.5 30.1 29.5  MCHC 30.6 31.7 31.1  RDW 13.1 13.1 13.1  PLT 156 173 181   Thyroid   Recent Labs  Lab 03/01/23 0541  TSH 2.111    BNPNo results for input(s): BNP, PROBNP in the last 168 hours.  DDimer No results for input(s): DDIMER in the last 168 hours.   Radiology    ECHOCARDIOGRAM COMPLETE Result Date: 03/01/2023    ECHOCARDIOGRAM REPORT   Patient Name:   Summer [name] Date of Exam: 03/01/2023 Medical Rec #:  [id number]        Height:       65.0 in Accession #:    [id number]       Weight:       221.0 lb Date of Birth:  10-01-1955        BSA:          2.064 m Patient Age:    68 years         BP:           132/68 mmHg Patient Gender: F                HR:           74 bpm. Exam Location:  Inpatient Procedure: 2D Echo, Cardiac Doppler and Color Doppler Indications:    R94.31 Abnormal EKG  History:        Patient has no prior history of Echocardiogram examinations.                 Abnormal ECG, Arrythmias:Tachycardia, Signs/Symptoms:Syncope,                 Dizziness/Lightheadedness and Chest Pain; Risk Factors:Diabetes,                 Hypertension and Dyslipidemia.   Sonographer:    Ellouise Mose RDCS Referring Phys: [id number] [facility]  Sonographer Comments: Image acquisition challenging due to patient body habitus. IMPRESSIONS  1. Left ventricular ejection fraction, by estimation, is 60 to 65%. The left ventricle has normal function. The left ventricle has no regional wall motion abnormalities. There is mild left ventricular hypertrophy. Left ventricular diastolic parameters are consistent with Grade I diastolic dysfunction (impaired relaxation).  2. Right ventricular systolic function is normal. The right ventricular size is normal. Tricuspid regurgitation signal is inadequate for assessing PA pressure.  3. The mitral valve is normal in structure. Mild mitral valve regurgitation. No evidence of mitral stenosis.  4. The aortic valve is tricuspid. There is mild calcification of the aortic valve. Aortic valve regurgitation is not visualized. No aortic stenosis is present.  5. The inferior vena cava is normal in size with greater than 50% respiratory variability, suggesting right atrial pressure of 3 mmHg. FINDINGS  Left Ventricle: Left ventricular ejection fraction, by estimation, is 60 to 65%. The left ventricle has normal function. The left ventricle has no regional wall motion abnormalities. The left ventricular internal cavity size was normal in size. There is  mild left ventricular hypertrophy. Left ventricular diastolic parameters are consistent with Grade I diastolic dysfunction (impaired relaxation). Right Ventricle: The right ventricular size is  normal. No increase in right ventricular wall thickness. Right ventricular systolic function is normal. Tricuspid regurgitation signal is inadequate for assessing PA pressure. Left Atrium: Left atrial size was normal in size. Right Atrium: Right atrial size was normal in size. Pericardium: Trivial pericardial effusion is present. Mitral Valve: The mitral valve is normal in structure. Mild mitral valve regurgitation. No evidence  of mitral valve stenosis. Tricuspid Valve: The tricuspid valve is normal in structure. Tricuspid valve regurgitation is not demonstrated. Aortic Valve: The aortic valve is tricuspid. There is mild calcification of the aortic valve. Aortic valve regurgitation is not visualized. No aortic stenosis is present. Pulmonic Valve: The pulmonic valve was normal in structure. Pulmonic valve regurgitation is not visualized. Aorta: The aortic root is normal in size and structure. Venous: The inferior vena cava is normal in size with greater than 50% respiratory variability, suggesting right atrial pressure of 3 mmHg. IAS/Shunts: No atrial level shunt detected by color flow Doppler.  LEFT VENTRICLE PLAX 2D LVIDd:         3.60 cm     Diastology LVIDs:         2.40 cm     LV e' medial:    5.55 cm/s LV PW:         1.50 cm     LV E/e' medial:  16.8 LV IVS:        1.20 cm     LV e' lateral:   9.03 cm/s LVOT diam:     2.30 cm     LV E/e' lateral: 10.4 LV SV:         83 LV SV Index:   40 LVOT Area:     4.15 cm  LV Volumes (MOD) LV vol d, MOD A2C: 54.3 ml LV vol d, MOD A4C: 64.7 ml LV vol s, MOD A2C: 22.9 ml LV vol s, MOD A4C: 25.2 ml LV SV MOD A2C:     31.4 ml LV SV MOD A4C:     64.7 ml LV SV MOD BP:      36.0 ml RIGHT VENTRICLE             IVC RV S prime:     11.00 cm/s  IVC diam: 1.30 cm TAPSE (M-mode): 0.9 cm LEFT ATRIUM             Index        RIGHT ATRIUM          Index LA diam:        3.10 cm 1.50 cm/m   RA Area:     9.18 cm LA Vol (A2C):   31.7 ml 15.36 ml/m  RA Volume:   17.20 ml 8.33 ml/m LA Vol (A4C):   19.6 ml 9.50 ml/m LA Biplane Vol: 25.0 ml 12.11 ml/m  AORTIC VALVE             PULMONIC VALVE LVOT Vmax:   95.70 cm/s  PR End Diast Vel: 1.41 msec LVOT Vmean:  64.500 cm/s LVOT VTI:    0.199 m  AORTA Ao Root diam: 3.00 cm Ao Asc diam:  2.90 cm MITRAL VALVE MV Area (PHT): 3.42 cm     SHUNTS MV Decel Time: 222 msec     Systemic VTI:  0.20 m MV E velocity: 93.50 cm/s   Systemic Diam: 2.30 cm MV A velocity: 110.00 cm/s MV  E/A ratio:  0.85 Dalton McleanMD Electronically signed by Ezra Kanner Signature Date/Time: 03/01/2023/8:37:02 AM    Final  Cardiac Studies   Echo 02/28/22:  1. Left ventricular ejection fraction, by estimation, is 60 to 65%. The  left ventricle has normal function. The left ventricle has no regional  wall motion abnormalities. There is mild left ventricular hypertrophy.  Left ventricular diastolic parameters  are consistent with Grade I diastolic dysfunction (impaired relaxation).   2. Right ventricular systolic function is normal. The right ventricular  size is normal. Tricuspid regurgitation signal is inadequate for assessing  PA pressure.   3. The mitral valve is normal in structure. Mild mitral valve  regurgitation. No evidence of mitral stenosis.   4. The aortic valve is tricuspid. There is mild calcification of the  aortic valve. Aortic valve regurgitation is not visualized. No aortic  stenosis is present.   5. The inferior vena cava is normal in size with greater than 50%  respiratory variability, suggesting right atrial pressure of 3 mmHg.   Patient Profile     68 y.o. female with a hx of hypertension, hyperlipidemia, diabetes, GERD, CKD 3b, seizures and BPPV who is being seen for the evaluation of tachcyardia and EKG changes.  Assessment & Plan    Tachycardia Palpitations - suspected due to dehydration and interruption of BB - resume metoprolol  25 mg BID - TSH 2.111 - echo with good BiV function, mild MR - given palpitations, consider heart monitor OP   Mild MR - do not suspect this is causing symptoms - monitor with repeat echo in 1-2 years   Hypertension - 25 mg lopressor  BID - would not restart hydrochlorothiazide - keep BP log for fu appt   Coronary and aortic atherosclerosis - TWI chronic - echo with preserved EF and no WMA - will plan for OP coronary CTA   Hyperlipidemia with LDL goal < 70 - given DM, could consider lower goal depending on  results from CT coronary - continue lipitor - repeat lipid panel when fasting - LDL was 103 in 2023   I have made cardiology follow up.     For questions or updates, please contact [facility] Please consult [email or web address] for contact info under        Signed, Jon Nat Hails, PA  03/01/2023, 11:55 AM

## 2023-03-01 NOTE — Progress Notes (Signed)
  Echocardiogram 2D Echocardiogram has been performed.  Summer [name] 03/01/2023, 8:35 AM

## 2023-03-01 NOTE — Plan of Care (Signed)

## 2023-03-07 DIAGNOSIS — N183 Chronic kidney disease, stage 3 unspecified: Secondary | ICD-10-CM | POA: Diagnosis not present

## 2023-03-07 DIAGNOSIS — Z0000 Encounter for general adult medical examination without abnormal findings: Secondary | ICD-10-CM | POA: Diagnosis not present

## 2023-03-07 DIAGNOSIS — G501 Atypical facial pain: Secondary | ICD-10-CM | POA: Diagnosis not present

## 2023-03-07 DIAGNOSIS — I129 Hypertensive chronic kidney disease with stage 1 through stage 4 chronic kidney disease, or unspecified chronic kidney disease: Secondary | ICD-10-CM | POA: Diagnosis not present

## 2023-03-07 DIAGNOSIS — E559 Vitamin D deficiency, unspecified: Secondary | ICD-10-CM | POA: Diagnosis not present

## 2023-03-07 DIAGNOSIS — E876 Hypokalemia: Secondary | ICD-10-CM | POA: Diagnosis not present

## 2023-03-07 DIAGNOSIS — E1122 Type 2 diabetes mellitus with diabetic chronic kidney disease: Secondary | ICD-10-CM | POA: Diagnosis not present

## 2023-03-07 DIAGNOSIS — I700 Atherosclerosis of aorta: Secondary | ICD-10-CM | POA: Diagnosis not present

## 2023-03-07 DIAGNOSIS — N3281 Overactive bladder: Secondary | ICD-10-CM | POA: Diagnosis not present

## 2023-03-07 DIAGNOSIS — N2581 Secondary hyperparathyroidism of renal origin: Secondary | ICD-10-CM | POA: Diagnosis not present

## 2023-03-07 DIAGNOSIS — M810 Age-related osteoporosis without current pathological fracture: Secondary | ICD-10-CM | POA: Diagnosis not present

## 2023-03-07 DIAGNOSIS — E785 Hyperlipidemia, unspecified: Secondary | ICD-10-CM | POA: Diagnosis not present

## 2023-03-11 ENCOUNTER — Other Ambulatory Visit (HOSPITAL_COMMUNITY): Payer: Self-pay

## 2023-03-11 ENCOUNTER — Encounter (HOSPITAL_BASED_OUTPATIENT_CLINIC_OR_DEPARTMENT_OTHER): Payer: Self-pay | Admitting: Family

## 2023-03-11 ENCOUNTER — Ambulatory Visit (HOSPITAL_BASED_OUTPATIENT_CLINIC_OR_DEPARTMENT_OTHER): Payer: Medicare Other | Admitting: Family

## 2023-03-11 VITALS — BP 132/64 | HR 68 | Ht 65.0 in | Wt 218.6 lb

## 2023-03-11 DIAGNOSIS — I700 Atherosclerosis of aorta: Secondary | ICD-10-CM | POA: Diagnosis not present

## 2023-03-11 DIAGNOSIS — I2510 Atherosclerotic heart disease of native coronary artery without angina pectoris: Secondary | ICD-10-CM | POA: Diagnosis not present

## 2023-03-11 DIAGNOSIS — Z87898 Personal history of other specified conditions: Secondary | ICD-10-CM | POA: Diagnosis not present

## 2023-03-11 DIAGNOSIS — E785 Hyperlipidemia, unspecified: Secondary | ICD-10-CM

## 2023-03-11 DIAGNOSIS — R072 Precordial pain: Secondary | ICD-10-CM

## 2023-03-11 MED ORDER — NITROGLYCERIN 0.4 MG SL SUBL: 0.4000 mg | SUBLINGUAL_TABLET | SUBLINGUAL | 3 refills | Status: AC | PRN

## 2023-03-11 MED ORDER — METOPROLOL TARTRATE 25 MG PO TABS: 25.0000 mg | ORAL_TABLET | Freq: Two times a day (BID) | ORAL | 1 refills | Status: DC

## 2023-03-11 MED ORDER — IVABRADINE HCL 5 MG PO TABS
10.0000 mg | ORAL_TABLET | Freq: Once | ORAL | 0 refills | Status: AC
  Filled 2023-03-11 – 2023-03-20 (×2): qty 2, 1d supply, fill #0

## 2023-03-11 NOTE — Progress Notes (Unsigned)
  Cardiology Office Note:  .   Date:  03/11/2023  ID:  Fuller Song, DOB 1956/02/11, MRN [id number] PCP: Laurann [place], MD   HeartCare Providers Cardiologist:  Chilton Si, MD { Click to update primary MD,subspecialty MD or APP then REFRESH:1}   History of Present Illness: .   Summer [name] is a 68 y.o. female ***  Staruday nurse cam 90/58. Today 135/64. Most often blood pressure when she is getting up or changing positions. She has not had recurrent palpitations. No recurrent vomititng. She is eating 2 meals per day. Drinking 5 bottle 16 oz water and one Ice drinks daily. ***drinking most of her.  Lightheadedness more often in the morning afternoon.   Taking MEtoprolol 9 am and then in the evening 6:30-7P  Dizziness and blurriness has resolved after being off hydrochlorothiazide.   ROS: Please see the history of present illness.    All other systems reviewed and are negative.   Studies Reviewed: .        *** Risk Assessment/Calculations:             Physical Exam:   VS:  BP 132/64 (Cuff Size: Large)   Pulse 68   Ht 5\' 5"  (1.651 m)   Wt 218 lb 9.6 oz (99.2 kg)   SpO2 98%   BMI 36.38 kg/m    Wt Readings from Last 3 Encounters:  03/11/23 218 lb 9.6 oz (99.2 kg)  02/25/23 221 lb (100.2 kg)  02/23/23 221 lb (100.2 kg)    GEN: Well nourished, well developed in no acute distress NECK: No JVD; No carotid bruits CARDIAC: ***RRR, no murmurs, rubs, gallops RESPIRATORY:  Clear to auscultation without rales, wheezing or rhonchi  ABDOMEN: Soft, non-tender, non-distended EXTREMITIES:  No edema; No deformity   ASSESSMENT AND PLAN: .   ***       Dispo: follow up in ***  Signed, Alver Sorrow, NP

## 2023-03-11 NOTE — Patient Instructions (Addendum)
Medication Instructions:  Your physician recommends that you continue on your current medications as directed. Please refer to the Current Medication list given to you today.  *If you need a refill on your cardiac medications before your next appointment, please call your pharmacy*  Testing/Procedures: Your physician recommends a cardiac CT.   Follow-Up: At [facility], you and your health needs are our priority.  As part of our continuing mission to provide you with exceptional heart care, we have created designated Provider Care Teams.  These Care Teams include your primary Cardiologist (physician) and Advanced Practice Providers (APPs -  Physician Assistants and Nurse Practitioners) who all work together to provide you with the care you need, when you need it.  We recommend signing up for the patient portal called "MyChart".  Sign up information is provided on this After Visit Summary.  MyChart is used to connect with patients for Virtual Visits (Telemedicine).  Patients are able to view lab/test results, encounter notes, upcoming appointments, etc.  Non-urgent messages can be sent to your provider as well.   To learn more about what you can do with MyChart, go to [email or web address].    Your next appointment:   6 week(s)  Provider:   Chilton Si, MD or Gillian Shields, NP    Other Instructions         Your cardiac CT will be scheduled at one of the below locations:   [facility] [address] [place], [place] [address] [phone]  OR  [facility] [address] [place], [place] [address] [phone]  OR   [facility] [address] [place], [place] [address] [phone]  OR   [facility] [address] [place], [place] [address] [phone]  If scheduled at [facility], please arrive at the [facility] and Children's Entrance  (Entrance C2) of [facility] 30 minutes prior to test start time. You can use the FREE valet parking offered at entrance C (encouraged to control the heart rate for the test)  Proceed to the [facility] Radiology Department (first floor) to check-in and test prep.  All radiology patients and guests should use entrance C2 at [facility], accessed from [facility], even though the hospital's physical address listed is [address]    If scheduled at [facility] or [facility] please arrive 15 mins early for check-in and test prep.  There is spacious parking and easy access to the radiology department from the [facility] entrance. Please enter here and check-in with the desk attendant.   Please follow these instructions carefully (unless otherwise directed):  An IV will be required for this test and Nitroglycerin will be given.   On the Night Before the Test: Be sure to Drink plenty of water. Do not consume any caffeinated/decaffeinated beverages or chocolate 12 hours prior to your test. Do not take any antihistamines 12 hours prior to your test.  On the Day of the Test: Drink plenty of water until 1 hour prior to the test. Do not eat any food 1 hour prior to test. You may take your regular medications prior to the test.  Take metoprolol (Lopressor) two hours prior to test. If you take Furosemide/Hydrochlorothiazide/Spironolactone/Chlorthalidone, please HOLD on the morning of the test. Patients who wear a continuous glucose monitor MUST remove the device prior to scanning. FEMALES- please wear underwire-free bra  if available, avoid dresses & tight clothing      After the Test: Drink plenty of water. After receiving IV contrast, you may experience a mild flushed feeling. This is normal. On occasion, you may experience a mild rash up to 24 hours after the test. This is not dangerous. If this  occurs, you can take Benadryl 25 mg and increase your fluid intake. If you experience trouble breathing, this can be serious. If it is severe call 911 IMMEDIATELY. If it is mild, please call our office.  We will call to schedule your test 2-4 weeks out understanding that some insurance companies will need an authorization prior to the service being performed.   For more information and frequently asked questions, please visit our website : [email or web address]  For non-scheduling related questions, please contact the cardiac imaging nurse navigator should you have any questions/concerns: Cardiac Imaging Nurse Navigators Direct Office Dial: [phone]   For scheduling needs, including cancellations and rescheduling, please call [place], [phone].

## 2023-03-12 ENCOUNTER — Other Ambulatory Visit (HOSPITAL_COMMUNITY): Payer: Self-pay

## 2023-03-12 ENCOUNTER — Other Ambulatory Visit: Payer: Self-pay

## 2023-03-12 NOTE — Progress Notes (Signed)
ok 

## 2023-03-14 ENCOUNTER — Encounter (HOSPITAL_BASED_OUTPATIENT_CLINIC_OR_DEPARTMENT_OTHER): Payer: Self-pay | Admitting: Family

## 2023-03-15 ENCOUNTER — Other Ambulatory Visit: Payer: Self-pay

## 2023-03-20 ENCOUNTER — Other Ambulatory Visit (HOSPITAL_COMMUNITY): Payer: Self-pay

## 2023-03-20 ENCOUNTER — Telehealth (HOSPITAL_COMMUNITY): Payer: Self-pay | Admitting: *Deleted

## 2023-03-20 ENCOUNTER — Telehealth: Payer: Self-pay | Admitting: Cardiovascular Disease

## 2023-03-20 NOTE — Telephone Encounter (Signed)
Spoke with [employer], they are going to get the prescription for the ivabradine ready and the patient will go by the pharmacy and pick it up.

## 2023-03-20 NOTE — Telephone Encounter (Signed)
Reaching out to patient to offer assistance regarding upcoming cardiac imaging study; pt verbalizes understanding of appt date/time, parking situation and where to check in, pre-test NPO status and medications ordered, and verified current allergies; name and call back number provided for further questions should they arise Johney Frame RN Navigator Cardiac Imaging [facility] [phone] office [phone] cell

## 2023-03-20 NOTE — Telephone Encounter (Signed)
Patient callling in about a medication that was she is suppose to take before her CT on tomorrow. Please advise

## 2023-03-20 NOTE — Telephone Encounter (Signed)
Spoke with pt, she reports that she was told she would receive 1 pill in the mail from [place] to take prior to the CT scan tomorrow at 3 pm. She currently takes lopressor 25 mg twice daily. Aware will call her back with correct information.

## 2023-03-21 ENCOUNTER — Ambulatory Visit (HOSPITAL_COMMUNITY)
Admission: RE | Admit: 2023-03-21 | Discharge: 2023-03-21 | Disposition: A | Discharge status: Home / Self Care | Payer: Medicare Other | Source: Ambulatory Visit | Attending: Family | Admitting: Family

## 2023-03-21 DIAGNOSIS — R072 Precordial pain: Secondary | ICD-10-CM | POA: Diagnosis not present

## 2023-03-21 MED ORDER — NITROGLYCERIN 0.4 MG SL SUBL
0.8000 mg | SUBLINGUAL_TABLET | Freq: Once | SUBLINGUAL | Status: AC
  Administered 2023-03-21: 0.8 mg via SUBLINGUAL

## 2023-03-21 MED ORDER — IOHEXOL 350 MG/ML SOLN
95.0000 mL | Freq: Once | INTRAVENOUS | Status: AC | PRN
  Administered 2023-03-21: 95 mL via INTRAVENOUS

## 2023-03-21 MED ORDER — DILTIAZEM HCL 25 MG/5ML IV SOLN: 10.0000 mg | INTRAVENOUS | Status: DC | PRN

## 2023-03-21 MED ORDER — METOPROLOL TARTRATE 5 MG/5ML IV SOLN: 10.0000 mg | Freq: Once | INTRAVENOUS | Status: DC | PRN

## 2023-03-21 MED ORDER — NITROGLYCERIN 0.4 MG SL SUBL
SUBLINGUAL_TABLET | SUBLINGUAL | Status: AC
  Filled 2023-03-21: qty 2

## 2023-03-22 ENCOUNTER — Telehealth: Payer: Self-pay | Admitting: *Deleted

## 2023-03-22 DIAGNOSIS — E785 Hyperlipidemia, unspecified: Secondary | ICD-10-CM

## 2023-03-22 DIAGNOSIS — I2510 Atherosclerotic heart disease of native coronary artery without angina pectoris: Secondary | ICD-10-CM

## 2023-03-22 NOTE — Telephone Encounter (Signed)
pt aware of results Lab orders mailed to the pt  

## 2023-03-22 NOTE — Telephone Encounter (Signed)
-----   Message from Alver Sorrow sent at 03/22/2023  8:12 AM EST ----- Cardiac CTA with minimal nonobstructive coronary artery disease. Continue Aspirin, Metoprolol, Atorvastatin for secondary prevention. Recommend having fasting lipid panel within the next month to ensure cholesterol numbers at goal.

## 2023-04-29 ENCOUNTER — Encounter (HOSPITAL_BASED_OUTPATIENT_CLINIC_OR_DEPARTMENT_OTHER): Payer: Self-pay | Admitting: Family

## 2023-04-29 ENCOUNTER — Ambulatory Visit (HOSPITAL_BASED_OUTPATIENT_CLINIC_OR_DEPARTMENT_OTHER): Payer: Medicare Other | Admitting: Family

## 2023-04-29 VITALS — BP 128/60 | HR 70 | Ht 65.0 in | Wt 220.0 lb

## 2023-04-29 DIAGNOSIS — N183 Chronic kidney disease, stage 3 unspecified: Secondary | ICD-10-CM

## 2023-04-29 DIAGNOSIS — I10 Essential (primary) hypertension: Secondary | ICD-10-CM

## 2023-04-29 DIAGNOSIS — E119 Type 2 diabetes mellitus without complications: Secondary | ICD-10-CM

## 2023-04-29 DIAGNOSIS — E1122 Type 2 diabetes mellitus with diabetic chronic kidney disease: Secondary | ICD-10-CM

## 2023-04-29 DIAGNOSIS — I2510 Atherosclerotic heart disease of native coronary artery without angina pectoris: Secondary | ICD-10-CM

## 2023-04-29 DIAGNOSIS — E785 Hyperlipidemia, unspecified: Secondary | ICD-10-CM

## 2023-04-29 MED ORDER — METOPROLOL SUCCINATE ER 25 MG PO TB24: 12.5000 mg | ORAL_TABLET | Freq: Every day | ORAL | 3 refills | Status: DC

## 2023-04-29 NOTE — Patient Instructions (Addendum)
 Medication Instructions:  Your physician has recommended you make the following change in your medication:   Start: Metoprolol succinate 12.5mg  (half tablet)  daily  *If you need a refill on your cardiac medications before your next appointment, please call your pharmacy*   Lab Work: Lipid Panel today   Follow-Up: At [facility], you and your health needs are our priority.  As part of our continuing mission to provide you with exceptional heart care, we have created designated Provider Care Teams.  These Care Teams include your primary Cardiologist (physician) and Advanced Practice Providers (APPs -  Physician Assistants and Nurse Practitioners) who all work together to provide you with the care you need, when you need it.  We recommend signing up for the patient portal called "MyChart".  Sign up information is provided on this After Visit Summary.  MyChart is used to connect with patients for Virtual Visits (Telemedicine).  Patients are able to view lab/test results, encounter notes, upcoming appointments, etc.  Non-urgent messages can be sent to your provider as well.   To learn more about what you can do with MyChart, go to [email or web address].    Your next appointment:   6 month(s)  Provider:   Chilton Si, MD, Eligha Bridegroom, NP, or Gillian Shields, NP    Other Instructions

## 2023-04-29 NOTE — Progress Notes (Signed)
 Cardiology Office Note:  .   Date:  04/29/2023  ID:  Fuller Song, DOB 09-22-1955, MRN [id number] PCP: Laurann Montana, MD  [facility] Providers Cardiologist:  Chilton Si, MD    History of Present Illness: .   Summer [name] is a 68 y.o. female with hx of syncope, HTN, DM2, HLD, GERD, CKDIIIb,obesity, pulmonary nodule, depression, anxiety, BPPV, seizure, nonobstructive CAD, aortic atherosclerosis.   Admitted 1/6-1/10/25 after syncope at home. She was orthostatic in ED in setting of nausea, vomiting. Of note, occurred after first dose of Ozempic. CT abd/pelvis negative for acute findings. Hemoccult positive bot no melena nor hematochezia. GI consulted, likely diverticular bleed with no plan for intervention. Treated with IV hydration as well as given abx for UTI. Hypokalemia was repleted. Cardiology consulted due to EKG with anterior TWI slightly more prominent but chronic, as LVEF normal by echo during admission recommended for outpatient ichemic eval. Hydrochlorothiazide held on discharge.   Last seen 03/11/2023.  Due to episodes of hypotension at home with position changes was recommended to increase hydration and adjust metoprolol dosing to 12 hours apart.  Outpatient ischemic evaluation ordered. Cardiac CTA 03/21/2023 with calcium score 43 placing her in the 78th percentile for age/sex/reach much controlled with total plaque volume 31 mm.  There was minimal calcified plaque in the LAD less than 25%.  She was recommended continue aspirin, metoprolol, atorvastatin for secondary prevention.  Presents today for follow-up. Reports she called her PCP two weeks ago with BP persistently 110/52 associated with lightheadedness. She reduced Metoprolol to half tablet BID.  No recurrent lightheadedness since that time but has had BP readings most often 120-130s with arm cuff not previously checked for accuracy. Denies chest pain, exertional dyspnea. No formal exercise routine. Inquires about  GLP1 for weight loss.   ROS: Please see the history of present illness.    All other systems reviewed and are negative.   Studies Reviewed: .         Risk Assessment/Calculations:             Physical Exam:   VS:  BP 128/60   Pulse 70   Ht 5\' 5"  (1.651 m)   Wt 220 lb (99.8 kg)   SpO2 97%   BMI 36.61 kg/m    Wt Readings from Last 3 Encounters:  04/29/23 220 lb (99.8 kg)  03/11/23 218 lb 9.6 oz (99.2 kg)  02/25/23 221 lb (100.2 kg)    GEN: Well nourished, well developed in no acute distress NECK: No JVD; No carotid bruits CARDIAC: RRR, no murmurs, rubs, gallops RESPIRATORY:  Clear to auscultation without rales, wheezing or rhonchi  ABDOMEN: Soft, non-tender, non-distended EXTREMITIES:  No edema; No deformity   ASSESSMENT AND PLAN: .    Nonobstructive CAD/ Aortic atherosclerosis / HLD, LDL goal 70 -cardiac CTA 03/21/2023 with calcium score of 43 placing her in the 78th percentile with minimal calcified plaque in the LAD less than 25%.   Recommend secondary prevention including aspirin 81 mg daily, atorvastatin 40 mg daily, Toprol 12.5 mg daily.  Recommend aiming for 150 minutes of moderate intensity activity per week and following a heart healthy diet.   Update lipid panel today to ensure LDL at goal of less than 70. If not at goal, consider increased dose Atorvastatin.  Discussed referral to PREP exercise program, she wishes to start exercising on her own first and will contact us if interested in referral.  HTN - BP well controlled.  No recurrent hypotension  since reducing dose of metoprolol.  For ease of administration change from metoprolol to tartrate to metoprolol succinate 12.5 mg daily.  Hx of syncope - In setting of volume depletion. Echo 02/2023 unremarkable. No indication for further workup at this time.  No recurrence of symptoms.  DM2 - Continue to follow with PCP. Motivated to lose weight, plans to start exercise regimen. Inquires about GLP1. Previously had  nausea/vomiting after 1 dose of Ozempic. Encouraged to make lifestyle changes and discuss consideration of Mounjaro at upcoming May visit with PCP.   NGE9B - Careful titration of diuretic and antihypertensive.  Remain off hydrochlorothiazide as no longer needed for BP control.        Dispo: follow up in 6 weeks  Signed, Alver Sorrow, NP

## 2023-04-30 ENCOUNTER — Telehealth (HOSPITAL_BASED_OUTPATIENT_CLINIC_OR_DEPARTMENT_OTHER): Payer: Self-pay

## 2023-04-30 LAB — LIPID PANEL
Chol/HDL Ratio: 2.2 ratio (ref 0.0–4.4)
Cholesterol, Total: 172 mg/dL (ref 100–199)
HDL: 77 mg/dL (ref >39)
LDL Chol Calc (NIH): 81 mg/dL (ref 0–99)
Triglycerides: 75 mg/dL (ref 0–149)
VLDL Cholesterol Cal: 14 mg/dL (ref 5–40)

## 2023-04-30 NOTE — Telephone Encounter (Addendum)
 Left message for patient to call back    ----- Message from Alver Sorrow sent at 04/30/2023  7:55 AM EDT ----- LDL (bad cholesterol) of 81 which is slightly above goal of less than 70. Recommend increase Atorvastatin from 40mg  to 80mg  to ensure cholesterol at goal. Repeat FLP/LFT in 3 months.

## 2023-05-02 ENCOUNTER — Telehealth (HOSPITAL_BASED_OUTPATIENT_CLINIC_OR_DEPARTMENT_OTHER): Payer: Self-pay | Admitting: Cardiovascular Disease

## 2023-05-02 DIAGNOSIS — E785 Hyperlipidemia, unspecified: Secondary | ICD-10-CM

## 2023-05-02 MED ORDER — ATORVASTATIN CALCIUM 80 MG PO TABS: 40.0000 mg | ORAL_TABLET | Freq: Every evening | ORAL | 3 refills | Status: DC

## 2023-05-02 MED ORDER — EZETIMIBE 10 MG PO TABS
10.0000 mg | ORAL_TABLET | Freq: Every day | ORAL | 3 refills | Status: AC
Start: 2023-05-02 — End: 2023-11-12

## 2023-05-02 NOTE — Telephone Encounter (Signed)
 Left message for patient to call back

## 2023-05-02 NOTE — Telephone Encounter (Signed)
 Returned call to patient with updated recommendations, rx updated and ordered, labs ordered. Patient verbalizes understanding.

## 2023-05-02 NOTE — Telephone Encounter (Signed)
 Pt c/o medication issue:  1. Name of Medication:   metoprolol succinate (TOPROL XL) 25 MG 24 hr tablet   2. How are you currently taking this medication (dosage and times per day)?   3. Are you having a reaction (difficulty breathing--STAT)?   4. What is your medication issue?   Patient stated she picked up her refill today and the dosage still shows 25 mg.  Patient stated she requested a 12.5 mg dosage so she would not have to cut the tablets in half.  Patient wants a call back to advise on next steps.

## 2023-05-02 NOTE — Telephone Encounter (Signed)
 Returned call to patient explained that they don't make a tablet smaller than the 25mg . She will continue to split tablet in half.   Also had lab results for patient, reviewed results and recommendations, she states she has already been taking atorvastatin 80mg  daily. Will route to provider for input on changes.

## 2023-05-02 NOTE — Telephone Encounter (Signed)
 Add Zetia 10 mg daily with repeat FLP/LFT in 2-3 months.   Alver Sorrow, NP

## 2023-05-23 DIAGNOSIS — L811 Chloasma: Secondary | ICD-10-CM | POA: Diagnosis not present

## 2023-05-31 DIAGNOSIS — Z1231 Encounter for screening mammogram for malignant neoplasm of breast: Secondary | ICD-10-CM | POA: Diagnosis not present

## 2023-05-31 DIAGNOSIS — M810 Age-related osteoporosis without current pathological fracture: Secondary | ICD-10-CM | POA: Diagnosis not present

## 2023-07-08 DIAGNOSIS — E785 Hyperlipidemia, unspecified: Secondary | ICD-10-CM | POA: Diagnosis not present

## 2023-07-08 DIAGNOSIS — E1122 Type 2 diabetes mellitus with diabetic chronic kidney disease: Secondary | ICD-10-CM | POA: Diagnosis not present

## 2023-07-08 DIAGNOSIS — N2581 Secondary hyperparathyroidism of renal origin: Secondary | ICD-10-CM | POA: Diagnosis not present

## 2023-07-08 DIAGNOSIS — M25561 Pain in right knee: Secondary | ICD-10-CM | POA: Diagnosis not present

## 2023-07-08 DIAGNOSIS — I700 Atherosclerosis of aorta: Secondary | ICD-10-CM | POA: Diagnosis not present

## 2023-07-08 DIAGNOSIS — H5440 Blindness, one eye, unspecified eye: Secondary | ICD-10-CM | POA: Diagnosis not present

## 2023-07-08 DIAGNOSIS — N183 Chronic kidney disease, stage 3 unspecified: Secondary | ICD-10-CM | POA: Diagnosis not present

## 2023-07-08 DIAGNOSIS — I129 Hypertensive chronic kidney disease with stage 1 through stage 4 chronic kidney disease, or unspecified chronic kidney disease: Secondary | ICD-10-CM | POA: Diagnosis not present

## 2023-07-08 DIAGNOSIS — M25562 Pain in left knee: Secondary | ICD-10-CM | POA: Diagnosis not present

## 2023-07-12 NOTE — Addendum Note (Signed)
 Addended by: Guss Legacy on: 07/12/2023 01:19 PM   Modules accepted: Orders

## 2023-07-12 NOTE — Telephone Encounter (Signed)
 Called pt at request of NP, no answer, left detailed message, ok per DPR.

## 2023-07-12 NOTE — Telephone Encounter (Signed)
 Labs received from PCP 07/08/23 total cholesterol 147, HDL 64, LDL 68, triglycerides 79. All cholesterol numbers at goal.  Good result!   Jenie Parish S Juanette Urizar, NP

## 2023-07-20 DIAGNOSIS — E785 Hyperlipidemia, unspecified: Secondary | ICD-10-CM | POA: Diagnosis not present

## 2023-07-20 DIAGNOSIS — M810 Age-related osteoporosis without current pathological fracture: Secondary | ICD-10-CM | POA: Diagnosis not present

## 2023-07-25 DIAGNOSIS — D631 Anemia in chronic kidney disease: Secondary | ICD-10-CM | POA: Diagnosis not present

## 2023-07-25 DIAGNOSIS — N183 Chronic kidney disease, stage 3 unspecified: Secondary | ICD-10-CM | POA: Diagnosis not present

## 2023-07-25 DIAGNOSIS — E1122 Type 2 diabetes mellitus with diabetic chronic kidney disease: Secondary | ICD-10-CM | POA: Diagnosis not present

## 2023-07-25 DIAGNOSIS — N2581 Secondary hyperparathyroidism of renal origin: Secondary | ICD-10-CM | POA: Diagnosis not present

## 2023-07-25 DIAGNOSIS — N1832 Chronic kidney disease, stage 3b: Secondary | ICD-10-CM | POA: Diagnosis not present

## 2023-07-25 DIAGNOSIS — I129 Hypertensive chronic kidney disease with stage 1 through stage 4 chronic kidney disease, or unspecified chronic kidney disease: Secondary | ICD-10-CM | POA: Diagnosis not present

## 2023-08-19 DIAGNOSIS — M810 Age-related osteoporosis without current pathological fracture: Secondary | ICD-10-CM | POA: Diagnosis not present

## 2023-08-19 DIAGNOSIS — E785 Hyperlipidemia, unspecified: Secondary | ICD-10-CM | POA: Diagnosis not present

## 2023-08-20 DIAGNOSIS — M810 Age-related osteoporosis without current pathological fracture: Secondary | ICD-10-CM | POA: Diagnosis not present

## 2023-09-19 DIAGNOSIS — E785 Hyperlipidemia, unspecified: Secondary | ICD-10-CM | POA: Diagnosis not present

## 2023-09-19 DIAGNOSIS — M810 Age-related osteoporosis without current pathological fracture: Secondary | ICD-10-CM | POA: Diagnosis not present

## 2023-09-23 ENCOUNTER — Encounter: Payer: Self-pay | Admitting: Neurology

## 2023-09-23 ENCOUNTER — Telehealth: Payer: Self-pay | Admitting: Neurology

## 2023-09-23 ENCOUNTER — Ambulatory Visit: Admitting: Neurology

## 2023-09-23 VITALS — BP 128/70 | Ht 65.0 in | Wt 215.0 lb

## 2023-09-23 DIAGNOSIS — G501 Atypical facial pain: Secondary | ICD-10-CM | POA: Diagnosis not present

## 2023-09-23 MED ORDER — CARBAMAZEPINE 200 MG PO TABS: 200.0000 mg | ORAL_TABLET | Freq: Two times a day (BID) | ORAL | 3 refills | Status: AC

## 2023-09-23 MED ORDER — LEVETIRACETAM 500 MG PO TABS: 500.0000 mg | ORAL_TABLET | Freq: Two times a day (BID) | ORAL | 3 refills | Status: AC

## 2023-09-23 NOTE — Telephone Encounter (Signed)
 Patient dropped of [employer] paperwork paid $50 put in Debra's box

## 2023-09-23 NOTE — Patient Instructions (Signed)
 Great to see you today.  Check labs today.  Continue current medications.  Refills were submitted.  Please call for issues.  Follow-up in 1 year.  Thanks!!
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
## 2023-09-24 ENCOUNTER — Ambulatory Visit: Payer: Self-pay | Admitting: Neurology

## 2023-09-24 DIAGNOSIS — Z0289 Encounter for other administrative examinations: Secondary | ICD-10-CM

## 2023-09-24 LAB — COMPREHENSIVE METABOLIC PANEL WITH GFR
ALT: 12 IU/L (ref 0–32)
AST: 18 IU/L (ref 0–40)
Albumin: 4.1 g/dL (ref 3.9–4.9)
Alkaline Phosphatase: 133 IU/L — ABNORMAL HIGH (ref 44–121)
BUN/Creatinine Ratio: 10 — ABNORMAL LOW (ref 12–28)
BUN: 15 mg/dL (ref 8–27)
Bilirubin Total: 0.3 mg/dL (ref 0.0–1.2)
CO2: 21 mmol/L (ref 20–29)
Calcium: 9 mg/dL (ref 8.7–10.3)
Chloride: 103 mmol/L (ref 96–106)
Creatinine, Ser: 1.48 mg/dL — ABNORMAL HIGH (ref 0.57–1.00)
Globulin, Total: 3.3 g/dL (ref 1.5–4.5)
Glucose: 95 mg/dL (ref 70–99)
Potassium: 4.9 mmol/L (ref 3.5–5.2)
Sodium: 137 mmol/L (ref 134–144)
Total Protein: 7.4 g/dL (ref 6.0–8.5)
eGFR: 39 mL/min/1.73 — ABNORMAL LOW (ref >59)

## 2023-09-24 LAB — CARBAMAZEPINE LEVEL, TOTAL: Carbamazepine (Tegretol), S: 5.3 ug/mL (ref 4.0–12.0)

## 2023-09-26 ENCOUNTER — Telehealth: Payer: Self-pay | Admitting: *Deleted

## 2023-09-26 NOTE — Telephone Encounter (Signed)
 Pt [employer] form faxed on 09/25/2023

## 2023-10-03 ENCOUNTER — Ambulatory Visit: Payer: Medicare Other | Admitting: Neurology

## 2023-10-03 DIAGNOSIS — I700 Atherosclerosis of aorta: Secondary | ICD-10-CM | POA: Diagnosis not present

## 2023-10-03 DIAGNOSIS — I129 Hypertensive chronic kidney disease with stage 1 through stage 4 chronic kidney disease, or unspecified chronic kidney disease: Secondary | ICD-10-CM | POA: Diagnosis not present

## 2023-10-03 DIAGNOSIS — D649 Anemia, unspecified: Secondary | ICD-10-CM | POA: Diagnosis not present

## 2023-10-03 DIAGNOSIS — E785 Hyperlipidemia, unspecified: Secondary | ICD-10-CM | POA: Diagnosis not present

## 2023-10-03 DIAGNOSIS — N2581 Secondary hyperparathyroidism of renal origin: Secondary | ICD-10-CM | POA: Diagnosis not present

## 2023-10-03 DIAGNOSIS — H5440 Blindness, one eye, unspecified eye: Secondary | ICD-10-CM | POA: Diagnosis not present

## 2023-10-03 DIAGNOSIS — N183 Chronic kidney disease, stage 3 unspecified: Secondary | ICD-10-CM | POA: Diagnosis not present

## 2023-10-03 DIAGNOSIS — E1122 Type 2 diabetes mellitus with diabetic chronic kidney disease: Secondary | ICD-10-CM | POA: Diagnosis not present

## 2023-10-20 DIAGNOSIS — E785 Hyperlipidemia, unspecified: Secondary | ICD-10-CM | POA: Diagnosis not present

## 2023-10-20 DIAGNOSIS — M810 Age-related osteoporosis without current pathological fracture: Secondary | ICD-10-CM | POA: Diagnosis not present

## 2023-11-12 ENCOUNTER — Encounter (HOSPITAL_BASED_OUTPATIENT_CLINIC_OR_DEPARTMENT_OTHER): Payer: Self-pay | Admitting: Family

## 2023-11-12 ENCOUNTER — Ambulatory Visit (HOSPITAL_BASED_OUTPATIENT_CLINIC_OR_DEPARTMENT_OTHER): Admitting: Family

## 2023-11-12 VITALS — BP 118/74 | HR 73 | Ht 65.5 in | Wt 212.2 lb

## 2023-11-12 DIAGNOSIS — I10 Essential (primary) hypertension: Secondary | ICD-10-CM

## 2023-11-12 DIAGNOSIS — I2510 Atherosclerotic heart disease of native coronary artery without angina pectoris: Secondary | ICD-10-CM

## 2023-11-12 DIAGNOSIS — E785 Hyperlipidemia, unspecified: Secondary | ICD-10-CM | POA: Diagnosis not present

## 2023-11-12 MED ORDER — ATORVASTATIN CALCIUM 80 MG PO TABS
80.0000 mg | ORAL_TABLET | Freq: Every evening | ORAL | Status: AC
Start: 2023-11-12 — End: ?

## 2023-11-12 NOTE — Progress Notes (Signed)
 Cardiology Office Note:  .   Date:  11/12/2023  ID:  Summer [name], DOB 04-Nov-1955, MRN [id number] PCP: Teresa Channel, MD  [facility] Providers Cardiologist:  Annabella Scarce, MD    History of Present Illness: .   Summer [name] is a 68 y.o. female with hx of syncope, HTN, DM2, HLD, GERD, CKDIIIb,obesity, pulmonary nodule, depression, anxiety, BPPV, seizure, nonobstructive CAD, aortic atherosclerosis.   Admitted 1/6-1/10/25 after syncope at home. She was orthostatic in ED in setting of nausea, vomiting. Of note, occurred after first dose of Ozempic. CT abd/pelvis negative for acute findings. Hemoccult positive bot no melena nor hematochezia. GI consulted, likely diverticular bleed with no plan for intervention. Treated with IV hydration as well as given abx for UTI. Hypokalemia was repleted. Cardiology consulted due to EKG with anterior TWI slightly more prominent but chronic, as LVEF normal by echo during admission recommended for outpatient ichemic eval. Hydrochlorothiazide held on discharge.   Seen 03/11/2023.  Due to episodes of hypotension at home with position changes was recommended to increase hydration and adjust metoprolol  dosing to 12 hours apart.  Outpatient ischemic evaluation ordered. Cardiac CTA 03/21/2023 with calcium  score 43 placing her in the 78th percentile for age/sex/reach much controlled with total plaque volume 31 mm.  There was minimal calcified plaque in the LAD less than 25%.  She was recommended continue aspirin , metoprolol , atorvastatin  for secondary prevention.  Last seen 04/29/2023.  She had symptomatic hypotension and had reduced metoprolol  to half tablet twice daily which had resolved lightheadedness.  This was consolidated to metoprolol  succinate 12.5 mg daily.  She had updated labs with PCP 06/2023 with total cholesterol 147, HDL 64, LDL 68 triglycerides 79 with all numbers at goal.  Presents today for follow-up. Weight loss of 8 lbs over the last 6  months by our records. She is physically active - walking and doing chair exercises. She is interested in the [employer]. She has been on Mounjaro 2.5mg  for several months and prefers to stay at this dose given prior history of Ozempic side effects. This has been well tolerated. She eats a varied diet with some fruits and vegetables, lean proteins, and air fries her food. She checks BP at home with readings 120s/70s. She denies chest pain, shortness of breath, weight gain, edema, palpitations. She has known BPPV and dizziness related to this.   ROS: Please see the history of present illness.    All other systems reviewed and are negative.   Studies Reviewed: .       Cardiac Studies & Procedures   ______________________________________________________________________________________________   STRESS TESTS  NM MYOCAR MULTI W/SPECT W 10/31/2021  Narrative CLINICAL DATA:  Chest pain.  Normal EKG and troponins.  EXAM: MYOCARDIAL IMAGING WITH SPECT (REST AND PHARMACOLOGIC-STRESS)  GATED LEFT VENTRICULAR WALL MOTION STUDY  LEFT VENTRICULAR EJECTION FRACTION  TECHNIQUE: Standard myocardial SPECT imaging was performed after resting intravenous injection of 10.2 mCi Tc-47m tetrofosmin . Subsequently, intravenous infusion of Lexiscan  was performed under the supervision of the Cardiology staff. At peak effect of the drug, 30.4 mCi Tc-33m 05/09/2020 was injected intravenously and standard myocardial SPECT imaging was performed. Quantitative gated imaging was also performed to evaluate left ventricular wall motion, and estimate left ventricular ejection fraction.  COMPARISON:  None Available.  FINDINGS: Perfusion: There is a small in size moderate in severity fixed defect involving the mid anterolateral and apicolateral segments.  Wall Motion: Normal left ventricular wall motion. Mildly decreased thickening involving the mid anterolateral segment. No  left ventricular dilation.  Left  Ventricular Ejection Fraction: 86 %  End diastolic volume 78 ml  End systolic volume 11 ml  IMPRESSION: 1. No reversible ischemia. Small in size, moderate in severity, fixed defect involves the mid anterolateral and apicolateral segments.  2. Normal left ventricular wall motion. Mildly decreased thickening of the mid anterolateral segment.  3. Left ventricular ejection fraction 86%  4. Non invasive risk stratification*: Low  *2012 Appropriate Use Criteria for Coronary Revascularization Focused Update: J Am Coll Cardiol. 2012;59(9):857-881. http://content.dementiazones.com.aspx?articleid=1201161   Electronically Signed By: Waddell Calk M.D. On: 10/31/2021 13:19   ECHOCARDIOGRAM  ECHOCARDIOGRAM COMPLETE 03/01/2023  Narrative ECHOCARDIOGRAM REPORT    Patient Name:   Summer [name] Date of Exam: 03/01/2023 Medical Rec #:  [id number]        Height:       65.0 in Accession #:    [id number]       Weight:       221.0 lb Date of Birth:  [birth year]        BSA:          2.064 m Patient Age:    67 years         BP:           132/68 mmHg Patient Gender: F                HR:           74 bpm. Exam Location:  Inpatient  Procedure: 2D Echo, Cardiac Doppler and Color Doppler  Indications:    R94.31 Abnormal EKG  History:        Patient has no prior history of Echocardiogram examinations. Abnormal ECG, Arrythmias:Tachycardia, Signs/Symptoms:Syncope, Dizziness/Lightheadedness and Chest Pain; Risk Factors:Diabetes, Hypertension and Dyslipidemia.  Sonographer:    Ellouise Mose RDCS Referring Phys: [id number] [facility]   Sonographer Comments: Image acquisition challenging due to patient body habitus. IMPRESSIONS   1. Left ventricular ejection fraction, by estimation, is 60 to 65%. The left ventricle has normal function. The left ventricle has no regional wall motion abnormalities. There is mild left ventricular hypertrophy. Left ventricular diastolic parameters are  consistent with Grade I diastolic dysfunction (impaired relaxation). 2. Right ventricular systolic function is normal. The right ventricular size is normal. Tricuspid regurgitation signal is inadequate for assessing PA pressure. 3. The mitral valve is normal in structure. Mild mitral valve regurgitation. No evidence of mitral stenosis. 4. The aortic valve is tricuspid. There is mild calcification of the aortic valve. Aortic valve regurgitation is not visualized. No aortic stenosis is present. 5. The inferior vena cava is normal in size with greater than 50% respiratory variability, suggesting right atrial pressure of 3 mmHg.  FINDINGS Left Ventricle: Left ventricular ejection fraction, by estimation, is 60 to 65%. The left ventricle has normal function. The left ventricle has no regional wall motion abnormalities. The left ventricular internal cavity size was normal in size. There is mild left ventricular hypertrophy. Left ventricular diastolic parameters are consistent with Grade I diastolic dysfunction (impaired relaxation).  Right Ventricle: The right ventricular size is normal. No increase in right ventricular wall thickness. Right ventricular systolic function is normal. Tricuspid regurgitation signal is inadequate for assessing PA pressure.  Left Atrium: Left atrial size was normal in size.  Right Atrium: Right atrial size was normal in size.  Pericardium: Trivial pericardial effusion is present.  Mitral Valve: The mitral valve is normal in structure. Mild mitral valve regurgitation. No evidence of mitral valve stenosis.  Tricuspid  Valve: The tricuspid valve is normal in structure. Tricuspid valve regurgitation is not demonstrated.  Aortic Valve: The aortic valve is tricuspid. There is mild calcification of the aortic valve. Aortic valve regurgitation is not visualized. No aortic stenosis is present.  Pulmonic Valve: The pulmonic valve was normal in structure. Pulmonic valve  regurgitation is not visualized.  Aorta: The aortic root is normal in size and structure.  Venous: The inferior vena cava is normal in size with greater than 50% respiratory variability, suggesting right atrial pressure of 3 mmHg.  IAS/Shunts: No atrial level shunt detected by color flow Doppler.   LEFT VENTRICLE PLAX 2D LVIDd:         3.60 cm     Diastology LVIDs:         2.40 cm     LV e' medial:    5.55 cm/s LV PW:         1.50 cm     LV E/e' medial:  16.8 LV IVS:        1.20 cm     LV e' lateral:   9.03 cm/s LVOT diam:     2.30 cm     LV E/e' lateral: 10.4 LV SV:         83 LV SV Index:   40 LVOT Area:     4.15 cm  LV Volumes (MOD) LV vol d, MOD A2C: 54.3 ml LV vol d, MOD A4C: 64.7 ml LV vol s, MOD A2C: 22.9 ml LV vol s, MOD A4C: 25.2 ml LV SV MOD A2C:     31.4 ml LV SV MOD A4C:     64.7 ml LV SV MOD BP:      36.0 ml  RIGHT VENTRICLE             IVC RV S prime:     11.00 cm/s  IVC diam: 1.30 cm TAPSE (M-mode): 0.9 cm  LEFT ATRIUM             Index        RIGHT ATRIUM          Index LA diam:        3.10 cm 1.50 cm/m   RA Area:     9.18 cm LA Vol (A2C):   31.7 ml 15.36 ml/m  RA Volume:   17.20 ml 8.33 ml/m LA Vol (A4C):   19.6 ml 9.50 ml/m LA Biplane Vol: 25.0 ml 12.11 ml/m AORTIC VALVE             PULMONIC VALVE LVOT Vmax:   95.70 cm/s  PR End Diast Vel: 1.41 msec LVOT Vmean:  64.500 cm/s LVOT VTI:    0.199 m  AORTA Ao Root diam: 3.00 cm Ao Asc diam:  2.90 cm  MITRAL VALVE MV Area (PHT): 3.42 cm     SHUNTS MV Decel Time: 222 msec     Systemic VTI:  0.20 m MV E velocity: 93.50 cm/s   Systemic Diam: 2.30 cm MV A velocity: 110.00 cm/s MV E/A ratio:  0.85  Dalton McleanMD Electronically signed by Ezra Kanner Signature Date/Time: 03/01/2023/8:37:02 AM    Final    MONITORS  LONG TERM MONITOR (3-14 DAYS) 08/14/2019  Narrative 1. SR/SB/ST 2. Occasional PACs/PVCs   CT SCANS  CT CORONARY MORPH W/CTA COR W/SCORE 03/21/2023  Addendum  04/02/2023 11:34 AM ADDENDUM REPORT: 04/02/2023 11:32  EXAM: OVER-READ INTERPRETATION  CT CHEST  The following report is an over-read performed by radiologist Dr. CHRISTELLA. [facility] on 04/02/2023. This over-read does not include interpretation of cardiac or coronary anatomy or pathology. The coronary CTA interpretation by the cardiologist is attached.  COMPARISON:  10/29/2021 CT angio chest  FINDINGS: Initial scout view demonstrates symmetric lung aeration. No large focal opacity. Slight elevation the right hemidiaphragm. Remote right clavicle ORIF. Degenerative changes throughout the spine. Obese body habitus.  No pericardial or pleural effusion. No bulky adenopathy. Esophagus unremarkable. Small hiatal hernia noted. No chest wall soft tissue abnormality or asymmetry. Included upper abdomen demonstrates a stable small hypodense cyst in the left hepatic lobe. No acute upper abdominal finding. Degenerative changes throughout the spine. Chronic midthoracic compression fracture as before. No acute osseous finding.  Limited lung windows demonstrate patent central airways. Minor basilar atelectasis. No acute airspace process or edema. No pleural abnormality or pneumothorax.  IMPRESSION: 1. No acute extra cardiac finding by CT. 2. Small hiatal hernia. 3. Chronic midthoracic compression fracture. 4. Remote right clavicle ORIF.   Electronically Signed By: CHRISTELLA.  Shick M.D. On: 04/02/2023 11:32  Narrative CLINICAL DATA:  Chest pain  EXAM: Cardiac/Coronary CTA  TECHNIQUE: A non-contrast, gated CT scan was obtained with axial slices of 3 mm through the heart for calcium  scoring. Calcium  scoring was performed using the Agatston method. A 120 kV prospective, gated, contrast cardiac scan was obtained. Gantry rotation speed was 250 msecs and collimation was 0.6 mm. Two sublingual nitroglycerin  tablets (0.8 mg) were given. The 3D data set was reconstructed in 5%  intervals of the 35-75% of the R-R cycle. Diastolic phases were analyzed on a dedicated workstation using MPR, MIP, and VRT modes. The patient received 95 cc of contrast.  FINDINGS: Image quality: Excellent.  Noise artifact is: Limited.  Coronary Arteries:  Normal coronary origin.  Right dominance.  Left main: The left main is a large caliber vessel with a normal take off from the left coronary cusp that bifurcates to form a left anterior descending artery and a left circumflex artery. There is no plaque or stenosis.  Left anterior descending artery: The proximal LAD contains minimal calcified plaque (<25%). The mid and distal segments are patent. The LAD gives off 2 patent diagonal branches.  Left circumflex artery: The LCX is non-dominant and patent with no evidence of plaque or stenosis. The LCX gives off 2 patent obtuse marginal branches.  Right coronary artery: The RCA is dominant with normal take off from the right coronary cusp. There is no evidence of plaque or stenosis. The RCA terminates as a PDA and right posterolateral branch without evidence of plaque or stenosis.  Right Atrium: Right atrial size is within normal limits.  Right Ventricle: The right ventricular cavity is within normal limits.  Left Atrium: Left atrial size is normal in size with no left atrial appendage filling defect.  Left Ventricle: The ventricular cavity size is within normal limits.  Pulmonary arteries: Normal in size.  Pulmonary veins: Normal pulmonary venous drainage.  Pericardium: Normal thickness without significant effusion or calcium  present.  Cardiac valves: The aortic valve is trileaflet without significant calcification. The mitral valve is normal without significant calcification.  Aorta: Normal caliber without significant disease.  Extra-cardiac findings: See attached radiology report for non-cardiac structures.  IMPRESSION: 1. Coronary calcium  score of 43. This was  78th percentile for age-, sex, and race-matched controls.  2. Total plaque volume 31 mm3 which is 35th percentile for age- and sex-matched controls (calcified plaque 8 mm3; non-calcified plaque 23 mm3). TPV is mild.  3. Normal coronary origin  with right dominance.  4. Minimal calcified plaque in the LAD (<25%).  RECOMMENDATIONS: 1. CAD-RADS 1: Minimal non-obstructive CAD (0-24%). Consider non-atherosclerotic causes of chest pain. Consider preventive therapy and risk factor modification.  Darryle Decent, MD  Electronically Signed: By: Darryle Decent M.D. On: 03/21/2023 21:48     ______________________________________________________________________________________________       Risk Assessment/Calculations:             Physical Exam:   VS:  BP 118/74   Pulse 73   Ht 5' 5.5 (1.664 m)   Wt 212 lb 3.2 oz (96.3 kg)   SpO2 100%   BMI 34.77 kg/m    Wt Readings from Last 3 Encounters:  11/12/23 212 lb 3.2 oz (96.3 kg)  09/23/23 215 lb (97.5 kg)  04/29/23 220 lb (99.8 kg)    GEN: Well nourished, well developed in no acute distress NECK: No JVD; No carotid bruits CARDIAC: RRR, no murmurs, rubs, gallops; radial and PT pulses 2+ bilaterally RESPIRATORY:  Clear to auscultation without rales, wheezing or rhonchi  ABDOMEN: Soft, non-tender, non-distended EXTREMITIES:  No edema; No deformity   ASSESSMENT AND PLAN: .    Nonobstructive CAD/ Aortic atherosclerosis / HLD, LDL goal 70 - Cardiac CTA 8/69/7974 with calcium  score of 43 placing her in the 78th percentile with minimal calcified plaque in the LAD less than 25%. 06/2023 LDL 68.  No indication for ischemia evaluation given lack of anginal symptoms. Continue GMDT: aspirin  81mg , metoprolol  succinate 12.5mg , atorvastatin  80mg  + zetia  10mg   Exercise recommendations: Recommend 150 minutes of moderate intensity exercise weekly. Referral to [facility] placed Recommend a low-salt diet is recommended. Meats should be grilled, baked,  or boiled. Avoid fried foods. Focus on lean protein sources like fish or chicken with vegetables and fruits.  HTN - Controlled by in office and home readings. No lightheadedness. Continue metoprolol  succinate 12.5mg  daily  Hx of syncope - In setting of volume depletion. Echo 02/2023 unremarkable. No indication for further workup at this time.  No recurrence of symptoms.  DM2 - 10/04/23 A1c 5.9. On Mounjaro 2.5mg /ml, farxiga 10mg  daily. Follows with PCP.  CKD3a - 09/23/23 BUN 15, creatinine 1.48, GFR 39. On farxiga 10mg  daily. Follows with nephrology.        Dispo: follow up in 1 year  Signed, Reche GORMAN Finder, NP

## 2023-11-12 NOTE — Patient Instructions (Signed)
 Medication Instructions:   Continue your current medications  *If you need a refill on your cardiac medications before your next appointment, please call your pharmacy*  Follow-Up: At [facility], you and your health needs are our priority.  As part of our continuing mission to provide you with exceptional heart care, our providers are all part of one team.  This team includes your primary Cardiologist (physician) and Advanced Practice Providers or APPs (Physician Assistants and Nurse Practitioners) who all work together to provide you with the care you need, when you need it.  Your next appointment:   1 year  Provider:   Annabella Scarce, MD, Rosaline Bane, NP, or Reche Finder, NP    We recommend signing up for the patient portal called MyChart.  Sign up information is provided on this After Visit Summary.  MyChart is used to connect with patients for Virtual Visits (Telemedicine).  Patients are able to view lab/test results, encounter notes, upcoming appointments, etc.  Non-urgent messages can be sent to your provider as well.   To learn more about what you can do with MyChart, go to [email or web address].   Other Instructions We have put a referral in for [facility] so you get 2 weeks for free!

## 2023-11-19 DIAGNOSIS — M810 Age-related osteoporosis without current pathological fracture: Secondary | ICD-10-CM | POA: Diagnosis not present

## 2023-11-19 DIAGNOSIS — E785 Hyperlipidemia, unspecified: Secondary | ICD-10-CM | POA: Diagnosis not present

## 2024-01-25 ENCOUNTER — Emergency Department (HOSPITAL_COMMUNITY)
Admission: EM | Admit: 2024-01-25 | Discharge: 2024-01-25 | Disposition: A | Discharge status: Home / Self Care | Attending: Emergency Medicine | Admitting: Emergency Medicine

## 2024-01-25 ENCOUNTER — Emergency Department (HOSPITAL_COMMUNITY)

## 2024-01-25 DIAGNOSIS — K115 Sialolithiasis: Secondary | ICD-10-CM

## 2024-01-25 DIAGNOSIS — B2790 Infectious mononucleosis, unspecified without complication: Secondary | ICD-10-CM

## 2024-01-25 DIAGNOSIS — K1120 Sialoadenitis, unspecified: Secondary | ICD-10-CM

## 2024-01-25 LAB — I-STAT CHEM 8, ED
BUN: 13 mg/dL (ref 8–23)
Calcium, Ion: 1.19 mmol/L (ref 1.15–1.40)
Chloride: 101 mmol/L (ref 98–111)
Creatinine, Ser: 1.6 mg/dL — ABNORMAL HIGH (ref 0.44–1.00)
Glucose, Bld: 139 mg/dL — ABNORMAL HIGH (ref 70–99)
HCT: 40 % (ref 36.0–46.0)
Hemoglobin: 13.6 g/dL (ref 12.0–15.0)
Potassium: 4.1 mmol/L (ref 3.5–5.1)
Sodium: 139 mmol/L (ref 135–145)
TCO2: 24 mmol/L (ref 22–32)

## 2024-01-25 LAB — CBC WITH DIFFERENTIAL/PLATELET
Abs Immature Granulocytes: 0.04 K/uL (ref 0.00–0.07)
Basophils Absolute: 0 K/uL (ref 0.0–0.1)
Basophils Relative: 0 %
Eosinophils Absolute: 0 K/uL (ref 0.0–0.5)
Eosinophils Relative: 0 %
HCT: 40 % (ref 36.0–46.0)
Hemoglobin: 12.8 g/dL (ref 12.0–15.0)
Immature Granulocytes: 0 %
Lymphocytes Relative: 7 %
Lymphs Abs: 0.8 K/uL (ref 0.7–4.0)
MCH: 29.5 pg (ref 26.0–34.0)
MCHC: 32 g/dL (ref 30.0–36.0)
MCV: 92.2 fL (ref 80.0–100.0)
Monocytes Absolute: 0.7 K/uL (ref 0.1–1.0)
Monocytes Relative: 6 %
Neutro Abs: 10.4 K/uL — ABNORMAL HIGH (ref 1.7–7.7)
Neutrophils Relative %: 87 %
Platelets: 209 K/uL (ref 150–400)
RBC: 4.34 MIL/uL (ref 3.87–5.11)
RDW: 13.2 % (ref 11.5–15.5)
WBC: 11.9 K/uL — ABNORMAL HIGH (ref 4.0–10.5)
nRBC: 0 % (ref 0.0–0.2)

## 2024-01-25 LAB — COMPREHENSIVE METABOLIC PANEL WITH GFR
ALT: 19 U/L (ref 0–44)
AST: 28 U/L (ref 15–41)
Albumin: 4.2 g/dL (ref 3.5–5.0)
Alkaline Phosphatase: 122 U/L (ref 38–126)
Anion gap: 12 (ref 5–15)
BUN: 13 mg/dL (ref 8–23)
CO2: 25 mmol/L (ref 22–32)
Calcium: 10 mg/dL (ref 8.9–10.3)
Chloride: 101 mmol/L (ref 98–111)
Creatinine, Ser: 1.38 mg/dL — ABNORMAL HIGH (ref 0.44–1.00)
GFR, Estimated: 42 mL/min — ABNORMAL LOW (ref >60)
Glucose, Bld: 146 mg/dL — ABNORMAL HIGH (ref 70–99)
Potassium: 4.1 mmol/L (ref 3.5–5.1)
Sodium: 138 mmol/L (ref 135–145)
Total Bilirubin: 0.5 mg/dL (ref 0.0–1.2)
Total Protein: 8.5 g/dL — ABNORMAL HIGH (ref 6.5–8.1)

## 2024-01-25 LAB — I-STAT CG4 LACTIC ACID, ED: Lactic Acid, Venous: 1.3 mmol/L (ref 0.5–1.9)

## 2024-01-25 LAB — GROUP A STREP BY PCR: Group A Strep by PCR: NOT DETECTED

## 2024-01-25 LAB — MONONUCLEOSIS SCREEN: Mono Screen: POSITIVE — AB

## 2024-01-25 MED ORDER — DEXAMETHASONE SOD PHOSPHATE PF 10 MG/ML IJ SOLN
10.0000 mg | Freq: Once | INTRAMUSCULAR | Status: AC
  Administered 2024-01-25: 10 mg via INTRAVENOUS

## 2024-01-25 MED ORDER — CLINDAMYCIN PHOSPHATE 300 MG/50ML IV SOLN
300.0000 mg | Freq: Once | INTRAVENOUS | Status: AC
  Administered 2024-01-25: 300 mg via INTRAVENOUS
  Filled 2024-01-25: qty 50

## 2024-01-25 MED ORDER — PREDNISONE 20 MG PO TABS: 20.0000 mg | ORAL_TABLET | Freq: Every day | ORAL | 0 refills | Status: AC

## 2024-01-25 MED ORDER — IOHEXOL 300 MG/ML  SOLN
75.0000 mL | Freq: Once | INTRAMUSCULAR | Status: AC | PRN
  Administered 2024-01-25: 75 mL via INTRAVENOUS

## 2024-01-25 MED ORDER — CLINDAMYCIN HCL 150 MG PO CAPS: 450.0000 mg | ORAL_CAPSULE | Freq: Four times a day (QID) | ORAL | 0 refills | Status: AC

## 2024-01-25 MED ORDER — KETOROLAC TROMETHAMINE 15 MG/ML IJ SOLN
15.0000 mg | Freq: Once | INTRAMUSCULAR | Status: AC
  Administered 2024-01-25: 15 mg via INTRAVENOUS
  Filled 2024-01-25: qty 1

## 2024-01-25 NOTE — ED Provider Notes (Signed)
 [place] [facility] DEPARTMENT AT [facility] Provider Note   CSN: [id number] Arrival date & time: 01/25/24  9040     Patient presents with: Sore Throat   PAT A Summer [name] is a 68 y.o. female with h/o HTN, HLD presents to the emerged from today for evaluation of sore throat.  Patient reports that she had some dental cleaning done on Wednesday.  She reports that that night she started feeling she was getting a sore throat and developed into thirsty as well.  Reports that she woke up this morning and had worsening of a sore throat pain and felt swelling to her neck.  Reports some pain swallowing but no difficulty breathing.  Denies any fevers, chest pain, or shortness of breath.  She denies any dental pain.   Sore Throat Pertinent negatives include no chest pain and no shortness of breath.       Prior to Admission medications   Medication Sig Start Date End Date Taking? Authorizing Provider  acetaminophen  (TYLENOL ) 325 MG tablet Take 2 tablets (650 mg total) by mouth every 6 (six) hours as needed for mild pain (pain score 1-3), fever or headache. 03/01/23   Von Bellis, MD  ALPRAZolam  (XANAX ) 0.25 MG tablet Take 0.125-0.25 mg by mouth daily as needed for anxiety.    [provider]  aspirin  EC 81 MG tablet Take 81 mg by mouth in the morning. Swallow whole.    [provider]  atorvastatin  (LIPITOR) 80 MG tablet Take 1 tablet (80 mg total) by mouth every evening. 11/12/23   Walker, Caitlin S, NP  buPROPion  (WELLBUTRIN  XL) 150 MG 24 hr tablet Take 150 mg by mouth every morning. 03/01/22   [provider]  Calcium  Carb-Cholecalciferol  (CALCIUM  1000 + D PO) Take 1 capsule by mouth daily.    [provider]  carbamazepine  (TEGRETOL ) 200 MG tablet Take 1 tablet (200 mg total) by mouth in the morning and at bedtime. Patient taking differently: Take 200 mg by mouth in the morning and at bedtime. Taking 2 tabs in the morning and one tab in the evening  09/23/23   Gayland Lauraine PARAS, NP  Cholecalciferol  50 MCG (2000 UT) CAPS Take 2,000 Units by mouth daily. 01/05/11   [provider]  cyanocobalamin  (VITAMIN B12) 1000 MCG tablet Take 1,000 mcg by mouth daily. 03/01/23   [provider]  dapagliflozin propanediol (FARXIGA) 10 MG TABS tablet Take 10 mg by mouth daily.    [provider]  denosumab  (PROLIA ) 60 MG/ML SOSY injection Inject 60 mg into the skin every 6 (six) months.    [provider]  desvenlafaxine (PRISTIQ) 100 MG 24 hr tablet Take 100 mg by mouth in the morning. 02/04/20   [provider]  EPINEPHrine 0.3 mg/0.3 mL IJ SOAJ injection Inject 0.3 mg into the muscle as needed for anaphylaxis. 09/19/20   [provider]  ezetimibe  (ZETIA ) 10 MG tablet Take 1 tablet (10 mg total) by mouth daily. 05/02/23 11/12/23  Walker, Caitlin S, NP  levETIRAcetam  (KEPPRA ) 500 MG tablet Take 1 tablet (500 mg total) by mouth 2 (two) times daily. 09/23/23   Gayland Lauraine PARAS, NP  metoprolol  succinate (TOPROL  XL) 25 MG 24 hr tablet Take 0.5 tablets (12.5 mg total) by mouth daily. 04/29/23   Walker, Caitlin S, NP  MOUNJARO 2.5 MG/0.5ML Pen Inject 2.5 mg into the skin once a week. 07/08/23   [provider]  nitroGLYCERIN  (NITROSTAT ) 0.4 MG SL tablet Place 1 tablet (0.4  mg total) under the tongue every 5 (five) minutes x 3 doses as needed for chest pain. 03/11/23   Walker, Caitlin S, NP  ondansetron  (ZOFRAN -ODT) 4 MG disintegrating tablet Take 1 tablet (4 mg total) by mouth every 8 (eight) hours as needed for vomiting. 02/24/23   Mesner, Selinda, MD  triamcinolone  ointment (KENALOG ) 0.1 % Apply 1 application topically 2 (two) times daily. 10/18/20   Iva Marty Saltness, MD    Allergies: Cinnamon, Meloxicam, Semaglutide, Tape, Wound dressing adhesive, Chlorhexidine , and Gabapentin     Review of Systems  Constitutional:  Negative for chills and fever.  HENT:  Positive for sore throat and trouble swallowing. Negative  for congestion, ear pain and rhinorrhea.   Respiratory:  Negative for shortness of breath.   Cardiovascular:  Negative for chest pain.  Gastrointestinal:  Negative for nausea and vomiting.    Updated Vital Signs BP (!) 166/71 (BP Location: Left Arm)   Pulse 92   Temp 99.2 F (37.3 C) (Oral)   Resp 18   Ht 5' 5 (1.651 m)   Wt 92.1 kg   SpO2 96%   BMI 33.78 kg/m   Physical Exam Vitals and nursing note reviewed.  Constitutional:      General: She is not in acute distress.    Appearance: She is not toxic-appearing.  HENT:     Mouth/Throat:     Mouth: Mucous membranes are moist.     Comments: Controlling secretions with normal phonation.  Trismus present.  Uvula does appear midline and airway is patent.  Does have some questionable exudate seen on the lateral aspects of the posterior oropharynx however difficult to see due to the patient's trismus.  No sublingual elevation noted.  No tenderness to the dentition upon palpation however she does have some gum erosion but no obvious abscess or area of induration or fluctuance. Neck:     Comments: Palpable mass/adenopathy?  On the left aspect.  Approximately the size of an egg.  Nonpulsatile.  No overlying warmth or erythema. Cardiovascular:     Rate and Rhythm: Normal rate.  Pulmonary:     Effort: Pulmonary effort is normal. No respiratory distress.     Breath sounds: No stridor. No wheezing.  Abdominal:     Palpations: Abdomen is soft.  Skin:    General: Skin is warm and dry.  Neurological:     Mental Status: She is alert.     (all labs ordered are listed, but only abnormal results are displayed) Labs Reviewed  CBC WITH DIFFERENTIAL/PLATELET - Abnormal; Notable for the following components:      Result Value   WBC 11.9 (*)    Neutro Abs 10.4 (*)    All other components within normal limits  COMPREHENSIVE METABOLIC PANEL WITH GFR - Abnormal; Notable for the following components:   Glucose, Bld 146 (*)    Creatinine, Ser  1.38 (*)    Total Protein 8.5 (*)    GFR, Estimated 42 (*)    All other components within normal limits  MONONUCLEOSIS SCREEN - Abnormal; Notable for the following components:   Mono Screen POSITIVE (*)    All other components within normal limits  I-STAT CHEM 8, ED - Abnormal; Notable for the following components:   Creatinine, Ser 1.60 (*)    Glucose, Bld 139 (*)    All other components within normal limits  GROUP A STREP BY PCR  I-STAT CG4 LACTIC ACID, ED  I-STAT CG4 LACTIC ACID, ED    EKG: None  Radiology: CT Soft Tissue Neck W Contrast Result Date: 01/25/2024 EXAM: CT NECK WITH CONTRAST 01/25/2024 11:35:50 AM TECHNIQUE: CT of the neck was performed with the administration of 75 mL of iohexol  (OMNIPAQUE ) 300 MG/ML solution. Multiplanar reformatted images are provided for review. Automated exposure control, iterative reconstruction, and/or weight based adjustment of the mA/kV was utilized to reduce the radiation dose to as low as reasonably achievable. COMPARISON: None available. CLINICAL HISTORY: Epiglottitis or tonsillitis suspected. Sore throat and swelling beginning at 2 am. FINDINGS: AERODIGESTIVE TRACT: No discrete mass. No edema. SALIVARY GLANDS: The left submandibular duct is dilated up to 5 mm. A punctate obstructing stone is noted anteriorly. Moderate intraglandular ductal dilation is present. The left submandibular gland is enlarged and hyperemic compared to the right. Mild inflammatory stranding surrounds the left submandibular gland. The right parotid and submandibular glands are unremarkable. THYROID : Unremarkable. LYMPH NODES: No suspicious cervical lymphadenopathy. SOFT TISSUES: No mass or fluid collection. BRAIN, ORBITS, SINUSES AND MASTOIDS: A small polyp or mucous retention cyst is present in the inferior maxillary sinus bilaterally. No acute abnormality. LUNGS AND MEDIASTINUM: No acute abnormality. BONES: Straightening and slight reversal of the normal cervical lordosis is  present. No focal bone abnormality. IMPRESSION: 1. Obstructive left submandibular sialolithiasis with associated sialadenitis. 2. Small polyps or mucous retention cysts in the inferior maxillary sinuses bilaterally. 3. Straightening and slight reversal of normal cervical lordosis. Electronically signed by: Lonni Necessary MD 01/25/2024 11:43 AM EST RP Workstation: [id number]    Procedures   Medications Ordered in the ED  ketorolac  (TORADOL ) 15 MG/ML injection 15 mg (15 mg Intravenous Given 01/25/24 1029)  dexamethasone  (DECADRON ) injection 10 mg (10 mg Intravenous Given 01/25/24 1121)  iohexol  (OMNIPAQUE ) 300 MG/ML solution 75 mL (75 mLs Intravenous Contrast Given 01/25/24 1126)    Medical Decision Making Amount and/or Complexity of Data Reviewed Labs: ordered. Radiology: ordered.  Risk Prescription drug management.   68 y.o. female presents to the ER for evaluation of sore throat. Differential diagnosis includes but is not limited to Viral pharyngitis, strep pharyngitis, dental caries/abscess, esophagitis, sinusitis, post nasal drip, reflux, angioedema, RTA/PTA, Ludwig's angina. Vital signs elevated BP otherwise unremarkable. Physical exam as noted above.   Patient's exam concern for abscess versus Ludwig's versus lymphadenopathy however would be quite large.  Will obtain CT imaging and labs.  Will give patient Toradol  and Decadron  for symptom management.  She has trismus but is currently controlling her secretions.  There is no stridor and she is not having any difficulty breathing and denies any shortness of breath.  I independently reviewed and interpreted the patient's labs.  CBC shows slight leukocytosis 11.9 with a left shift.  No anemia.  CMP shows glucose of 146 with a creatinine of 1.38 at baseline.  Total protein 8.5.  No other electrolyte or LFT abnormality.  Strep is negative.  I-STAT lactic within normal limits.  Mono positive.  CT scan shows 1. Obstructive left submandibular  sialolithiasis with associated sialadenitis. 2. Small polyps or mucous retention cysts in the inferior maxillary sinuses bilaterally. 3. Straightening and slight reversal of normal cervical lordosis. Per radiologist's interpretation.    I consulted ENT and spoke with Dr. Carlie.  He had this could be viral or bacterial, he does recommend treating it with antibiotics.  His recommendation would be clindamycin  3 times daily for 7 days. Given that the patient's positive for mono, could develop rash with penicillins so we will avoid.  Recommends dry throat lozenges, massages, applying heat to the area.  He would like to see the patient in a week in the office.  On reevaluation, the patient reports she is feeling significantly better.  She is no longer having trismus, I think this was likely secondary to pain.  She is controlling secretions with normal phonation.  Still no stridor auscultated.  She is tolerating PO without any difficulty. Will send her home with the clindamycin  as well as some prednisone  to help with some of her symptoms.  We discussed her positive mono and for things to look out for.  Discussed risk of splenic rupture with contact sports or with vigorous activity.  Recommended close follow-up with her PCP as well as the ENT.  Both of the information was given to her in the discharge paperwork.  We discussed the results of the labs/imaging. The plan is supportive care, take medications as prescribed, follow up with PCP, strict . We discussed strict return precautions and red flag symptoms. The patient verbalized their understanding and agrees to the plan. The patient is stable and being discharged home in good condition.  Portions of this report may have been transcribed using voice recognition software. Every effort was made to ensure accuracy; however, inadvertent computerized transcription errors may be present.    Final diagnoses:  Infectious mononucleosis without complication, infectious  mononucleosis due to unspecified organism  Sialadenitis  Sialolithiasis    ED Discharge Orders          Ordered    clindamycin  (CLEOCIN ) 150 MG capsule  Every 6 hours        01/25/24 1324    predniSONE  (DELTASONE ) 20 MG tablet  Daily        01/25/24 1324               Bernis Ernst, PA-C 01/25/24 1557    Francesca Elsie CROME, MD 01/26/24 [id number]

## 2024-01-25 NOTE — ED Triage Notes (Signed)
 Patient c/o sore throat, swelling Started 2 am Pain rated 9/10

## 2024-01-25 NOTE — Discharge Instructions (Addendum)
 You were seen in the ER today for evaluation of your symptoms. You were found to have mono, and salivary stone, and salivary infection. For this, you will need to see an ENT provider. I have included the information for one into this discharge paperwork. You will need to call them to schedule an appointment. For your salivary gland, you will be placed on an antibiotic to take 3 times daily for the next week.  I also recommend doing salivary gland massages and staying well-hydrated drinking plenty of fluids.  Has been she can try the dry mouth lozenges or sour candy to help with your production of saliva to help move the stone.  I am also going to send you home with some steroids that help we will help with your pain and inflammation as well.  Given your diagnosis of mono, I would avoid any contact sports or vigorous exercise or activity.  This does make you at increased risk for splenic rupture/injury.  I have included more information on this into the discharge report for you to review.  Please make sure you follow-up with your primary care provider as well as you will need reevaluation and further monitoring of this.  Please pick up the prescribed medications and take as directed.  For pain, you can take 1000 mg of Tylenol  every 6 hours as needed.  If you have any concerns, new or worsening symptoms, please return to your nearest emergency department for reevaluation.  **Please call your PCP this week to schedule an appointment!  Contact a health care provider if: Your fever isn't gone after 10 days. You have swollen lymph glands that aren't back to normal after 4 weeks. Your activity level isn't back to normal after 2 months. Your skin or the white parts of your eyes turn yellow. This is a condition called jaundice. You have trouble pooping, or constipation. You may: Poop fewer times in a week than normal. Have poop that is dry, hard, or bigger than normal. Get help right away if: You can't stop  vomiting. You're drooling or you have trouble swallowing. You're dehydrated. This is when you don't have enough water in your body. Signs may include: Weakness. Pale skin. Sunken eyes or dry mouth. Fast breathing or heartbeat. You have trouble breathing. You have a stiff neck or a very bad headache. You have very bad pain in your belly or shoulder. You are confused or have trouble with balance. You have a seizure. Your nose or gums start to bleed. These symptoms may be an emergency. Call 911 right away. Do not wait to see if the symptoms will go away. Do not drive yourself to the hospital.

## 2024-03-17 ENCOUNTER — Telehealth: Payer: Self-pay | Admitting: Family

## 2024-03-17 MED ORDER — METOPROLOL SUCCINATE ER 25 MG PO TB24: 25.0000 mg | ORAL_TABLET | Freq: Every day | ORAL | 1 refills | Status: AC

## 2024-03-17 NOTE — Telephone Encounter (Signed)
 Returned a call back to the pt.   Endorsed recommendations per Reche Finder, NP.   Pt states she just took her reading right after getting out of the shower seconds ago, and systolic number was 150.  She states she doesn't think she took it correctly, so she is going to finish getting ready, sit for about 10 mins, and take her BP again thereafter.   I will call her back in 20 mins to follow-up on new BP reading after she's been resting for a few mins.   Pt agreed to plan.

## 2024-03-17 NOTE — Telephone Encounter (Signed)
 Ensure taking Metoprolol  Succinate 12.5mg  daily as prescribed. Her BP readings look good, does she have more elevated readings? If bothered by palpitations, increase Metoprolol  Succinate to 25mg  daily.   Aseret Hoffman S Mahogany Torrance, NP

## 2024-03-17 NOTE — Telephone Encounter (Signed)
 Pt c/o BP issue: STAT if pt c/o blurred vision, one-sided weakness or slurred speech.  STAT if BP is GREATER than 180/120 TODAY.  STAT if BP is LESS than 90/60 and SYMPTOMATIC TODAY  1. What is your BP concern? Elevated at night   2. Have you taken any BP medication today? Yes   3. What are your last 5 BP readings?  125/63 79 113/62 81 128/71 88 4. Are you having any other symptoms (ex. Dizziness, headache, blurred vision, passed out)? No   Pt states BP elevates at night. Chest beats hard and pt can't sleep. Please advise.

## 2024-03-17 NOTE — Telephone Encounter (Signed)
 TY for talking with her!  Reema Chick S Saquoia Sianez, NP

## 2024-03-17 NOTE — Telephone Encounter (Signed)
 Returned a call back to the pt.  She states her BP/HR now is 122/85 HR-107 bpm.  Advised the pt with those readings, she should proceed with recommendation per Reche Finder, NP to increase her Toprol  XL 25 mg po daily.   Advised the pt to avoid caffeine, decaff, chocolate, soda, tea, and any other stimulants that may increase her HR/palpitations.   Advised her to make sure she is staying plenty hydrated with water.   Advised her to wear compression stockings during the day, to promote good circulation and avoid BP drop.   Confirmed the pharmacy of choice with the pt.   Pt verbalized understanding and agrees with this plan.  Will make Reche Finder, NP aware of this plan.

## 2024-03-20 NOTE — Telephone Encounter (Signed)
 Patient has been notified to take meds as per usual today.

## 2024-03-20 NOTE — Telephone Encounter (Signed)
 Proceed with usual medications today.  Yony Roulston S Awais Cobarrubias, NP

## 2024-03-20 NOTE — Telephone Encounter (Signed)
 Pt states BP is 109/65 and HR is 68 and hasn't taken medication yet. She wants to know if she should go ahead and take the medication or not Yesterday BP: 133/66 HR 74  Pt would like a c/b regarding this matter, please advise.

## 2024-09-29 ENCOUNTER — Ambulatory Visit: Admitting: Neurology
